# Patient Record
Sex: Male | Born: 1945 | Race: White | Hispanic: No | Marital: Married | Smoking: Never smoker
Health system: Southern US, Community
[De-identification: ages and names within clinical notes are randomized; demographics above are authoritative.]

## PROBLEM LIST (undated history)

## (undated) DIAGNOSIS — R0683 Snoring: Secondary | ICD-10-CM

## (undated) DIAGNOSIS — I499 Cardiac arrhythmia, unspecified: Secondary | ICD-10-CM

## (undated) DIAGNOSIS — I491 Atrial premature depolarization: Secondary | ICD-10-CM

## (undated) DIAGNOSIS — T4145XA Adverse effect of unspecified anesthetic, initial encounter: Secondary | ICD-10-CM

## (undated) DIAGNOSIS — I471 Supraventricular tachycardia: Secondary | ICD-10-CM

## (undated) DIAGNOSIS — I480 Paroxysmal atrial fibrillation: Secondary | ICD-10-CM

## (undated) DIAGNOSIS — I4719 Other supraventricular tachycardia: Secondary | ICD-10-CM

## (undated) DIAGNOSIS — T8859XA Other complications of anesthesia, initial encounter: Secondary | ICD-10-CM

## (undated) DIAGNOSIS — E7800 Pure hypercholesterolemia, unspecified: Secondary | ICD-10-CM

## (undated) DIAGNOSIS — M1990 Unspecified osteoarthritis, unspecified site: Secondary | ICD-10-CM

## (undated) DIAGNOSIS — J45909 Unspecified asthma, uncomplicated: Secondary | ICD-10-CM

## (undated) DIAGNOSIS — I209 Angina pectoris, unspecified: Secondary | ICD-10-CM

## (undated) DIAGNOSIS — R51 Headache: Secondary | ICD-10-CM

## (undated) DIAGNOSIS — R519 Headache, unspecified: Secondary | ICD-10-CM

## (undated) DIAGNOSIS — G971 Other reaction to spinal and lumbar puncture: Secondary | ICD-10-CM

## (undated) HISTORY — DX: Supraventricular tachycardia: I47.1

## (undated) HISTORY — DX: Paroxysmal atrial fibrillation: I48.0

## (undated) HISTORY — PX: TONSILLECTOMY: SUR1361

## (undated) HISTORY — PX: EYE SURGERY: SHX253

## (undated) HISTORY — DX: Other supraventricular tachycardia: I47.19

## (undated) HISTORY — DX: Pure hypercholesterolemia, unspecified: E78.00

## (undated) HISTORY — PX: OTHER SURGICAL HISTORY: SHX169

## (undated) HISTORY — DX: Snoring: R06.83

## (undated) HISTORY — DX: Atrial premature depolarization: I49.1

## (undated) HISTORY — PX: JOINT REPLACEMENT: SHX530

---

## 2008-08-16 ENCOUNTER — Encounter: Admission: RE | Admit: 2008-08-16 | Discharge: 2008-09-08 | Payer: Self-pay | Admitting: Neurology

## 2010-08-02 ENCOUNTER — Encounter: Admission: RE | Admit: 2010-08-02 | Discharge: 2010-08-30 | Payer: Self-pay | Admitting: Internal Medicine

## 2011-01-24 ENCOUNTER — Inpatient Hospital Stay (HOSPITAL_COMMUNITY): Payer: Medicare Other

## 2011-01-24 ENCOUNTER — Inpatient Hospital Stay (HOSPITAL_COMMUNITY)
Admission: AD | Admit: 2011-01-24 | Discharge: 2011-01-25 | DRG: 310 | Disposition: A | Discharge status: Home / Self Care | Payer: Medicare Other | Source: Ambulatory Visit | Attending: Internal Medicine | Admitting: Internal Medicine

## 2011-01-24 DIAGNOSIS — I4891 Unspecified atrial fibrillation: Principal | ICD-10-CM | POA: Diagnosis present

## 2011-01-24 DIAGNOSIS — J45909 Unspecified asthma, uncomplicated: Secondary | ICD-10-CM | POA: Diagnosis present

## 2011-01-24 LAB — COMPREHENSIVE METABOLIC PANEL
ALT: 19 U/L (ref 0–53)
AST: 15 U/L (ref 0–37)
Albumin: 4.2 g/dL (ref 3.5–5.2)
Alkaline Phosphatase: 52 U/L (ref 39–117)
BUN: 17 mg/dL (ref 6–23)
CO2: 23 mEq/L (ref 19–32)
Calcium: 9 mg/dL (ref 8.4–10.5)
Chloride: 104 mEq/L (ref 96–112)
Creatinine, Ser: 1.01 mg/dL (ref 0.4–1.5)
GFR calc Af Amer: >60 mL/min (ref >60)
GFR calc non Af Amer: >60 mL/min (ref >60)
Glucose, Bld: 89 mg/dL (ref 70–99)
Potassium: 3.9 mEq/L (ref 3.5–5.1)
Sodium: 140 mEq/L (ref 135–145)
Total Bilirubin: 0.6 mg/dL (ref 0.3–1.2)
Total Protein: 7 g/dL (ref 6.0–8.3)

## 2011-01-24 LAB — CBC
HCT: 46.2 % (ref 39.0–52.0)
Hemoglobin: 16.5 g/dL (ref 13.0–17.0)
MCH: 32.4 pg (ref 26.0–34.0)
MCHC: 35.7 g/dL (ref 30.0–36.0)
MCV: 90.6 fL (ref 78.0–100.0)
Platelets: 250 10*3/uL (ref 150–400)
RBC: 5.1 MIL/uL (ref 4.22–5.81)
RDW: 12.6 % (ref 11.5–15.5)
WBC: 9.5 10*3/uL (ref 4.0–10.5)

## 2011-01-24 LAB — CARDIAC PANEL(CRET KIN+CKTOT+MB+TROPI)
CK, MB: 2.5 ng/mL (ref 0.3–4.0)
Relative Index: INVALID (ref 0.0–2.5)
Total CK: 62 U/L (ref 7–232)
Troponin I: 0.01 ng/mL (ref 0.00–0.06)

## 2011-01-24 LAB — PROTIME-INR
INR: 0.97 (ref 0.00–1.49)
Prothrombin Time: 13.1 seconds (ref 11.6–15.2)

## 2011-01-24 LAB — TSH: TSH: 1.184 u[IU]/mL (ref 0.350–4.500)

## 2011-01-24 LAB — LIPID PANEL
Cholesterol: 232 mg/dL — ABNORMAL HIGH (ref 0–200)
HDL: 58 mg/dL (ref >39)
LDL Cholesterol: 140 mg/dL — ABNORMAL HIGH (ref 0–99)
Total CHOL/HDL Ratio: 4 RATIO
Triglycerides: 172 mg/dL — ABNORMAL HIGH (ref <150)
VLDL: 34 mg/dL (ref 0–40)

## 2011-01-24 LAB — APTT: aPTT: 30 seconds (ref 24–37)

## 2011-01-25 DIAGNOSIS — I517 Cardiomegaly: Secondary | ICD-10-CM

## 2011-01-25 LAB — CARDIAC PANEL(CRET KIN+CKTOT+MB+TROPI)
CK, MB: 1.8 ng/mL (ref 0.3–4.0)
CK, MB: 1.8 ng/mL (ref 0.3–4.0)
Relative Index: INVALID (ref 0.0–2.5)
Relative Index: INVALID (ref 0.0–2.5)
Total CK: 54 U/L (ref 7–232)
Total CK: 47 U/L (ref 7–232)
Troponin I: <0.01 ng/mL (ref 0.00–0.06)
Troponin I: 0.01 ng/mL (ref 0.00–0.06)

## 2011-01-25 LAB — CBC
HCT: 41.9 % (ref 39.0–52.0)
Hemoglobin: 14.5 g/dL (ref 13.0–17.0)
MCH: 31.7 pg (ref 26.0–34.0)
MCHC: 34.6 g/dL (ref 30.0–36.0)
MCV: 91.5 fL (ref 78.0–100.0)
Platelets: 217 10*3/uL (ref 150–400)
RBC: 4.58 MIL/uL (ref 4.22–5.81)
RDW: 12.9 % (ref 11.5–15.5)
WBC: 6.9 10*3/uL (ref 4.0–10.5)

## 2011-01-25 LAB — D-DIMER, QUANTITATIVE (NOT AT ARMC): D-Dimer, Quant: <0.22 ug/mL-FEU (ref 0.00–0.48)

## 2011-01-25 LAB — HEPARIN LEVEL (UNFRACTIONATED): Heparin Unfractionated: 0.13 IU/mL — ABNORMAL LOW (ref 0.30–0.70)

## 2011-01-31 NOTE — H&P (Addendum)
NAMENAT, LOWENTHAL NO.:  [id number]  MEDICAL RECORD NO.:  [id number]           PATIENT TYPE:  I  LOCATION:  2041                         FACILITY:  [facility]  PHYSICIAN:  Deirdre Peer. Janziel Hockett, M.D. DATE OF BIRTH:  1946/03/25  DATE OF ADMISSION:  01/24/2011 DATE OF DISCHARGE:                             HISTORY & PHYSICAL   CHIEF COMPLAINT:  Fatigue.  HISTORY OF PRESENT ILLNESS:  Pleasant 65 year old male presents to the office with complaint of irregular heartbeat and weakness.  The patient was in his usual state of health until walking to the gym the day he felt a little fatigued, no energy and had a vague discomfort in his left arm.  When he got to the gym, he did not feel like he had much energy; therefore, he tried to lift less weight that he typically does; however, because of feeling fatigue and occasional palpitations, he left the gym and went home.  When he went home, his wife felt his pulse, checked his blood pressure and felt that his heart rate was irregular.  Because of that, the patient presented to the office for evaluation.  In the office, he was evaluated and was found to be in atrial fibrillation.  He still feels a little fatigue and no energy; however, no shortness of breath.  He has had no periods of chest pain.  He has never had atrial fibrillation before.  There is no excessive alcohol.  No drugs.  No excessive caffeine.  The patient has had stress test in the past year, which was negative for any ischemia.  There is no history of congestive heart failure, hypertension, diabetes and he is less than 32 years of age given that with CHADS score of 0.  Because of his AFib and being symptomatic, the patient will be admitted for further evaluation and treatment.  PAST MEDICAL HISTORY:  Significant for: 1. Asthma. 2. 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  ALLERGIES:  No known drug allergies.  MEDICATIONS:  P.r.n. diclofenac, Flonase  p.r.n.  SURGICAL HISTORY:  Significant for eye surgery, status post trauma and repair of perianal fistula.  FAMILY HISTORY:  Father deceased, he had heart disease and MI.  He was a smoker.  His mother deceased.  She also had heart disease.  One sister deceased from pancreatic cancer.  SOCIAL HISTORY:  Negative for tobacco.  Social alcohol.  No drugs.  REVIEW OF SYSTEMS:  As stated in HPI.  PHYSICAL EXAMINATION:  GENERAL:  The patient is alert and oriented x3, weight 167 pounds, temperature 98.6, BP 112/64, pulse by EKG 143. HEENT:  Unremarkable. CHEST:  Clear without rales, rhonchi or rub. CARDIOVASCULAR:  Irregularly irregular rhythm. ABDOMEN:  Soft, nontender. EXTREMITIES:  No clubbing, cyanosis or edema.  DATA:  EKG; atrial fibrillation with a rate of 143.  ASSESSMENT:  New-onset atrial fibrillation, multiple rapid ventricular response in a patient who is marginally symptomatic.  The patient will be admitted to the hospital, labs will be obtain including TSH, electrolytes and serial cardiac enzymes.  The patient will be started on IV heparin as well as IV diltiazem.  We will obtain a 2-D echo while he is hospitalized.  The patient may have a cardiac evaluation by cardiologist to help determine if Coumadin versus other agents such as aspirin or Permax are indicated.  We will make further recommendations after review of the above studies.     Deirdre Peer. Dhruti Ghuman, M.D.     RDP/MEDQ  D:  01/24/2011  T:  01/25/2011  Job:  [id number]  Electronically Signed by Windy Fast Juliene Kirsh M.D. on 01/29/2011 05:22:53 PM Electronically Signed by Windy Fast Jareli Highland M.D. on 01/29/2011 05:22:53 PM Electronically Signed by Windy Fast Calem Cocozza M.D. on 01/29/2011 07:36:58 PM

## 2011-02-26 NOTE — Discharge Summary (Signed)
  NAMEROLLYN, SCIALDONE NO.:  [id number]  MEDICAL RECORD NO.:  [id number]           PATIENT TYPE:  I  LOCATION:  2041                         FACILITY:  [facility]  PHYSICIAN:  Theressa Millard, M.D.    DATE OF BIRTH:  1946-07-24  DATE OF ADMISSION:  01/24/2011 DATE OF DISCHARGE:  01/25/2011                              DISCHARGE SUMMARY   ADMITTING DIAGNOSIS:  Atrial fibrillation.  DISCHARGE DIAGNOSES: 1. Transient atrial fibrillation, resolved. 2. Asthma.  The patient is a 65 year old white male who presented with atrial fibrillation with rapid ventricular rate to the office.  By the time he got to the hospital, his atrial fibrillation had resolved.  Thyroid testing was negative.  Echocardiogram was unremarkable.  Metoprolol was added.  He was discharged in improved condition.  DISCHARGE MEDICATIONS: 1. Diclofenac 75 mg 1 b.i.d. p.r.n. 2. Flonase 2 puffs daily. 3. Metoprolol tartrate 25 mg twice daily. FOLLOWUP:  We will call to check on him in followup but he will keep his August 2012 appointment unless he has further problems.  ACTIVITY:  As tolerated.  DIET:  No added salt.     Theressa Millard, M.D.     JO/MEDQ  D:  02/08/2011  T:  02/09/2011  Job:  [id number]  Electronically Signed by Theressa Millard M.D. on 02/26/2011 08:09:16 AM

## 2011-08-19 ENCOUNTER — Inpatient Hospital Stay (HOSPITAL_COMMUNITY)
Admission: AD | Admit: 2011-08-19 | Discharge: 2011-08-21 | DRG: 310 | Disposition: A | Discharge status: Home / Self Care | Payer: Medicare Other | Source: Ambulatory Visit | Attending: Internal Medicine | Admitting: Internal Medicine

## 2011-08-19 ENCOUNTER — Inpatient Hospital Stay (HOSPITAL_COMMUNITY): Payer: Medicare Other

## 2011-08-19 DIAGNOSIS — J45909 Unspecified asthma, uncomplicated: Secondary | ICD-10-CM | POA: Diagnosis present

## 2011-08-19 DIAGNOSIS — Z79899 Other long term (current) drug therapy: Secondary | ICD-10-CM

## 2011-08-19 DIAGNOSIS — I4891 Unspecified atrial fibrillation: Principal | ICD-10-CM | POA: Diagnosis present

## 2011-08-19 DIAGNOSIS — G4733 Obstructive sleep apnea (adult) (pediatric): Secondary | ICD-10-CM | POA: Diagnosis present

## 2011-08-19 LAB — PROTIME-INR
INR: 1.01 (ref 0.00–1.49)
Prothrombin Time: 13.5 seconds (ref 11.6–15.2)

## 2011-08-19 LAB — COMPREHENSIVE METABOLIC PANEL
ALT: 15 U/L (ref 0–53)
AST: 16 U/L (ref 0–37)
Albumin: 4.2 g/dL (ref 3.5–5.2)
Alkaline Phosphatase: 48 U/L (ref 39–117)
BUN: 18 mg/dL (ref 6–23)
CO2: 24 mEq/L (ref 19–32)
Calcium: 9 mg/dL (ref 8.4–10.5)
Chloride: 107 mEq/L (ref 96–112)
Creatinine, Ser: 0.92 mg/dL (ref 0.50–1.35)
GFR calc Af Amer: >60 mL/min (ref >60)
GFR calc non Af Amer: >60 mL/min (ref >60)
Glucose, Bld: 95 mg/dL (ref 70–99)
Potassium: 3.9 mEq/L (ref 3.5–5.1)
Sodium: 141 mEq/L (ref 135–145)
Total Bilirubin: 0.6 mg/dL (ref 0.3–1.2)
Total Protein: 6.9 g/dL (ref 6.0–8.3)

## 2011-08-19 LAB — CBC
HCT: 44.3 % (ref 39.0–52.0)
Hemoglobin: 15.9 g/dL (ref 13.0–17.0)
MCH: 32.5 pg (ref 26.0–34.0)
MCHC: 35.9 g/dL (ref 30.0–36.0)
MCV: 90.6 fL (ref 78.0–100.0)
Platelets: 201 10*3/uL (ref 150–400)
RBC: 4.89 MIL/uL (ref 4.22–5.81)
RDW: 12.6 % (ref 11.5–15.5)
WBC: 9.3 10*3/uL (ref 4.0–10.5)

## 2011-08-19 LAB — CARDIAC PANEL(CRET KIN+CKTOT+MB+TROPI)
CK, MB: 5.1 ng/mL — ABNORMAL HIGH (ref 0.3–4.0)
Relative Index: INVALID (ref 0.0–2.5)
Total CK: 98 U/L (ref 7–232)
Troponin I: <0.3 ng/mL (ref <0.30)

## 2011-08-19 LAB — TSH: TSH: 1.2 u[IU]/mL (ref 0.350–4.500)

## 2011-08-19 LAB — APTT: aPTT: 30 seconds (ref 24–37)

## 2011-08-20 LAB — CBC
HCT: 43 % (ref 39.0–52.0)
Hemoglobin: 15.2 g/dL (ref 13.0–17.0)
MCH: 32.3 pg (ref 26.0–34.0)
MCHC: 35.3 g/dL (ref 30.0–36.0)
MCV: 91.3 fL (ref 78.0–100.0)
Platelets: 201 10*3/uL (ref 150–400)
RBC: 4.71 MIL/uL (ref 4.22–5.81)
RDW: 12.8 % (ref 11.5–15.5)
WBC: 7.3 10*3/uL (ref 4.0–10.5)

## 2011-08-20 LAB — CARDIAC PANEL(CRET KIN+CKTOT+MB+TROPI)
CK, MB: 4.1 ng/mL — ABNORMAL HIGH (ref 0.3–4.0)
Relative Index: INVALID (ref 0.0–2.5)
Total CK: 77 U/L (ref 7–232)
Troponin I: <0.3 ng/mL (ref <0.30)

## 2011-08-24 NOTE — Consult Note (Signed)
David Wright, NIDAY NO.:  [id number]  MEDICAL RECORD NO.:  [id number]  LOCATION:  2023                         FACILITY:  [facility]  PHYSICIAN:  Armanda Magic, M.D.     DATE OF BIRTH:  11-Nov-1946  DATE OF CONSULTATION:  08/20/2011 DATE OF DISCHARGE:                                CONSULTATION   REFERRING PHYSICIAN:  Theressa Millard, MD  CHIEF COMPLAINT:  Palpitations and atrial fibrillation.  HISTORY OF PRESENT ILLNESS:  This is a 65 year old male with a history of atrial fibrillation episode x1 back in April 2012 and at that time was admitted briefly to the hospital, but converted back to sinus rhythm before he then got to a floor bed.  He was on metoprolol at home and over the weekend on Saturday started having palpitations and noticing his heart beating irregular and fast.  He said it would go anywhere from 80-140 beats per minute.  He denied any chest pain, dizziness, shortness of breath, or syncope, but did notice some mild dyspnea on exertion going up the stairs.  He presented to the office yesterday and was found to be in atrial fibrillation with RVR with a heart rate 148 beats per minute.  He was started on IV Cardizem drip and started on subcu Lovenox per pharmacy for AFib.  Of note, his workup in March was normal with a 2- D echocardiogram that was normal and he had a nuclear stress test in 2011 that was normal as well.  His TSH has been normal.  PAST MEDICAL HISTORY: 1. Asthma. 2. Atrial fibrillation. 3. Mild arthritis.  MEDICATIONS ON ADMISSION: 1. Flonase nasal spray once a day. 2. Metoprolol 25 mg b.i.d. 3. Advair 100/50 mcg 1 inhalation b.i.d.  ALLERGIES:  None.  SURGICAL HISTORY:  Eye surgery for trauma.  FAMILY HISTORY:  Significant for heart disease in both parents and pancreatic CA.  SOCIAL HISTORY:  He denies any tobacco use.  He occasionally drinks alcohol.  He denies any caffeine.  He is an obstruction Programmer, multimedia. He is  married.  REVIEW OF SYSTEMS:  Otherwise as stated in HPI is negative.  PHYSICAL EXAMINATION:  VITAL SIGNS:  Blood pressure is 116/71, heart rate 94, he is afebrile, respirations 18, and O2 saturation 94% on room air. GENERAL:  This is a well-developed, well-nourished white male in no acute distress. HEENT:  Benign. NECK:  Supple without lymphadenopathy.  Carotid upstrokes are +2 bilaterally.  No bruits. LUNGS:  Clear to auscultation throughout. HEART:  Irregularly irregular.  No murmurs, rubs, or gallops.  Normal S1 and S2. ABDOMEN:  Soft, nontender, and nondistended.  Normoactive bowel sounds. No hepatosplenomegaly. EXTREMITIES:  No cyanosis, erythema, or edema.  LABORATORY DATA:  His white cell count is 9.3, hemoglobin 15.9, hematocrit 44.3, and platelet count 201.  INR is 1.01.  Cardiac enzymes: CPK 98 and 77 and MB 5.1 and 4.1, troponin less than 0.3 x2.  Sodium 141, potassium 3.9, chloride 107, bicarb 24, glucose 95, BUN 18, and creatinine 0.92.  LFTs are normal.  TSH is 1.2.  Chest x-ray showed no active disease with borderline cardiomegaly without edema.  EKG on admission showed atrial fibrillation with  rapid ventricular response at 35 beats per minute with nonspecific ST abnormality.  ASSESSMENT: 1. Recurrent atrial fibrillation with rapid ventricular response,     questionably secondary obstructive sleep apnea. 2. Snoring and sleep apnea consistent with obstructive sleep apnea. 3. Asthma.  PLAN:  Start Xarelto and stop Lovenox.  We will change IV Cardizem to Cardizem CD 180 mg daily.  Continue metoprolol.  If heart rate is well controlled with ambulation, okay to discharge tomorrow.  We would recommend outpatient sleep study asap.  If he does have sleep apnea, we would prefer for him to be on CPAP prior to cardioversion.  I will see him back in the office in 2 weeks.  If he is still in atrial fibrillation, we will have an outpatient cardioversion after 4 weeks  of Xarelto.  He will need to be on anticoagulation for 4 additional weeks after cardioversion and then okay to transition to aspirin since his CHADS score is 0.     Armanda Magic, M.D.     TT/MEDQ  D:  08/20/2011  T:  08/20/2011  Job:  [id number]  Electronically Signed by Armanda Magic M.D. on 08/24/2011 04:51:40 PM

## 2011-08-26 NOTE — H&P (Signed)
NAMETATEN, MERROW NO.:  [id number]  MEDICAL RECORD NO.:  [id number]  LOCATION:  2023                         FACILITY:  [facility]  PHYSICIAN:  Georgann Housekeeper, MD      DATE OF BIRTH:  [birth year]  DATE OF ADMISSION:  08/19/2011 DATE OF DISCHARGE:                             HISTORY & PHYSICAL   PRIMARY CARE PHYSICIAN:  Theressa Millard, MD  CHIEF COMPLAINT:  Palpitation and history of atrial fibrillation.  HISTORY OF PRESENT ILLNESS:  A 65 year old male with history of atrial fibrillation episode back in April of this year, was admitted briefly to the hospital, we converted back to normal sinus rhythm.  He was on metoprolol at home over the weekend on Saturday, started having palpitation and heart beating fast and irregular and according to him, the heart rate would go anyway from 80-140 and he had no chest pain, no dizziness, no syncope, no shortness of breath, came into the office with those complaints.  The patient was found to be in atrial fibrillation with rapid ventricular response in the office with a heart rate of 148. He denied any chest pain or shortness of breath.  His blood pressure was systolic 120.  He had not taken any new medications.  His workup in the past including 2-D echocardiogram in March was normal.  He had negative cardiac stress test by cardiology in 2011.  Thyroid test was normal.  PAST MEDICAL HISTORY:  Includes asthma, atrial fibrillation and mentioned above, and some mild arthritis.  MEDICATIONS:  On admission, 1. Flonase nasal spray once a day. 2. Metoprolol 25 mg b.i.d. 3. Advair 100/50 one inhalation b.i.d.  ALLERGIES:  None.  SURGICAL HISTORY:  Eye surgery for trauma.  FAMILY HISTORY:  Positive for heart disease in both parents and pancreatic cancer in the system  SOCIAL HISTORY:  No tobacco.  Alcohol social.  No caffeine, occasional. He is a Psychologist, sport and exercise.  Married.  PHYSICAL EXAMINATION:  GENERAL:  He  is pleasant gentleman in the office, alert and oriented, in no acute distress. VITAL SIGNS:  Blood pressure 120/70, heart rate between 120-140, irregular, temperature 97.2, weight of 164 pounds, height of 68 inches. NECK:  Supple.  No thyromegaly. LUNGS: Clear. CARDIAC:  Irregularly irregular rhythm, tachycardic without any murmurs. EXTREMITIES:  No edema.  Good pulses. ABDOMEN:  Soft without any tenderness.  LABORATORY DATA:  EKG obtained in the office showed atrial fibrillation with rapid ventricular response of heart rate of 148.  ASSESSMENT:  A 65 year old gentleman with atrial fibrillation, rapid ventricular response. 1. Cardiac, atrial fibrillation.  I talked with cardiology briefly.     Because of his atrial fibrillation and rapid ventricular response,     was sent him to the hospital admission for rate control.  Start him     on diltiazem drip, Lovenox per pharmacy full dose.  He may need     long-term anticoagulation.  He recently had an echo in March.  We     will hold off on that.  Cardiac markers, blood work, EKG, and chest     x-ray to be done in the hospital.  If needed, we will call the  cardiology, telemetry monitoring for the heart rate and blood     pressure. 2. History of asthma.  Continue on his Advair and Flonase as far as     follow up in the morning.  Discussed plan with the patient and his wife.  The patient was sent to the emergency room and orders written.     Georgann Housekeeper, MD     KH/MEDQ  D:  08/19/2011  T:  08/19/2011  Job:  [id number]  Electronically Signed by Georgann Housekeeper MD on 08/26/2011 09:58:12 PM

## 2011-08-29 NOTE — Discharge Summary (Signed)
  NAMEDECLIN, RAJAN NO.:  [id number]  MEDICAL RECORD NO.:  [id number]  LOCATION:  2023                         FACILITY:  [facility]  PHYSICIAN:  Theressa Millard, M.D.    DATE OF BIRTH:  1946-08-29  DATE OF ADMISSION:  08/19/2011 DATE OF DISCHARGE:  08/21/2011                              DISCHARGE SUMMARY   ADMITTING DIAGNOSIS:  Atrial fibrillation with rapid ventricular rate.  DISCHARGE DIAGNOSES: 1. Rapid atrial fibrillation, paroxysmal, with rapid ventricular rate. 2. Rule out obstructive sleep apnea with loud smearing and history of     witnessed apnea. 3. Asthma.  The patient is a 65 year old white male, had an episode of atrial fibrillation in March of this year.  At that time, his echocardiogram showed grade 1 diastolic dysfunction along with left atrial enlargement. At that time, no etiology of the atrial fibrillation was found.  With this recurring episode, I asked the patient specific questions about sleep apnea and he stated that he has been told that he snores loudly in that he has had witnessed apnea, but he has not had any excessive daytime sleepiness, so he did not think that it was very important.  Of note is the fact that his wife has obstructive sleep apnea, is currently under treatment with CPAP.  Initially, his rate was quite rapid in the 120s to 130s.  He was given a Cardizem drip that seemed to slow slightly.  He was seen in consultation by Dr. Mayford Knife.  She recommended switching him to oral diltiazem along with resuming the oral metoprolol that he was on prior to admission. This was done and resulted in resolution in his atrial fibrillation.  At this point, the patient will be discharged on beta blocker, calcium channel blocker, and aspirin.  Anticoagulation will not be done since the patient got out of atrial fibrillation and less than 72 hours.  Regarding etiology, we will arrange a sleep study to be done in the  near future.  DISCHARGE MEDICATIONS: 1. Diltiazem 180 CD once daily. 2. Advair 100/50 one puff b.i.d. 3. Diclofenac 75 mg b.i.d. p.r.n. 4. Flonase one spray in each nostril daily. 5. Metoprolol tartrate 25 mg twice daily.  FOLLOWUP:  We will arrange a sleep study for the patient and then will follow up the patient after that.  DIET:  No added salt.  ACTIVITY:  As tolerated.     Theressa Millard, M.D.     JO/MEDQ  D:  08/21/2011  T:  08/21/2011  Job:  [id number]  Electronically Signed by Theressa Millard M.D. on 08/29/2011 01:40:37 PM

## 2012-01-06 DIAGNOSIS — J45909 Unspecified asthma, uncomplicated: Secondary | ICD-10-CM | POA: Diagnosis not present

## 2012-01-06 DIAGNOSIS — J301 Allergic rhinitis due to pollen: Secondary | ICD-10-CM | POA: Diagnosis not present

## 2012-01-06 DIAGNOSIS — J3089 Other allergic rhinitis: Secondary | ICD-10-CM | POA: Diagnosis not present

## 2012-01-13 DIAGNOSIS — R002 Palpitations: Secondary | ICD-10-CM | POA: Diagnosis not present

## 2012-01-13 DIAGNOSIS — I4891 Unspecified atrial fibrillation: Secondary | ICD-10-CM | POA: Diagnosis not present

## 2012-01-15 DIAGNOSIS — R079 Chest pain, unspecified: Secondary | ICD-10-CM | POA: Diagnosis not present

## 2012-01-16 DIAGNOSIS — I4891 Unspecified atrial fibrillation: Secondary | ICD-10-CM | POA: Diagnosis not present

## 2012-01-17 DIAGNOSIS — I4891 Unspecified atrial fibrillation: Secondary | ICD-10-CM | POA: Diagnosis not present

## 2012-01-20 DIAGNOSIS — R079 Chest pain, unspecified: Secondary | ICD-10-CM | POA: Diagnosis not present

## 2012-01-20 DIAGNOSIS — I498 Other specified cardiac arrhythmias: Secondary | ICD-10-CM | POA: Diagnosis not present

## 2012-01-20 DIAGNOSIS — I4891 Unspecified atrial fibrillation: Secondary | ICD-10-CM | POA: Diagnosis not present

## 2012-01-20 DIAGNOSIS — I472 Ventricular tachycardia: Secondary | ICD-10-CM | POA: Diagnosis not present

## 2012-01-23 DIAGNOSIS — H2510 Age-related nuclear cataract, unspecified eye: Secondary | ICD-10-CM | POA: Diagnosis not present

## 2012-01-23 DIAGNOSIS — H35039 Hypertensive retinopathy, unspecified eye: Secondary | ICD-10-CM | POA: Diagnosis not present

## 2012-01-23 DIAGNOSIS — H43399 Other vitreous opacities, unspecified eye: Secondary | ICD-10-CM | POA: Diagnosis not present

## 2012-01-24 DIAGNOSIS — I472 Ventricular tachycardia: Secondary | ICD-10-CM | POA: Diagnosis not present

## 2012-01-24 DIAGNOSIS — I4891 Unspecified atrial fibrillation: Secondary | ICD-10-CM | POA: Diagnosis not present

## 2012-01-24 DIAGNOSIS — I498 Other specified cardiac arrhythmias: Secondary | ICD-10-CM | POA: Diagnosis not present

## 2012-01-24 DIAGNOSIS — R079 Chest pain, unspecified: Secondary | ICD-10-CM | POA: Diagnosis not present

## 2012-02-20 DIAGNOSIS — I4891 Unspecified atrial fibrillation: Secondary | ICD-10-CM | POA: Diagnosis not present

## 2012-02-20 DIAGNOSIS — I491 Atrial premature depolarization: Secondary | ICD-10-CM | POA: Diagnosis not present

## 2012-05-13 DIAGNOSIS — M25559 Pain in unspecified hip: Secondary | ICD-10-CM | POA: Diagnosis not present

## 2012-05-25 DIAGNOSIS — I4891 Unspecified atrial fibrillation: Secondary | ICD-10-CM | POA: Diagnosis not present

## 2012-05-25 DIAGNOSIS — I959 Hypotension, unspecified: Secondary | ICD-10-CM | POA: Diagnosis not present

## 2012-05-25 DIAGNOSIS — R002 Palpitations: Secondary | ICD-10-CM | POA: Diagnosis not present

## 2012-06-08 DIAGNOSIS — R002 Palpitations: Secondary | ICD-10-CM | POA: Diagnosis not present

## 2012-06-08 DIAGNOSIS — I959 Hypotension, unspecified: Secondary | ICD-10-CM | POA: Diagnosis not present

## 2012-06-08 DIAGNOSIS — I4891 Unspecified atrial fibrillation: Secondary | ICD-10-CM | POA: Diagnosis not present

## 2012-06-10 DIAGNOSIS — M25559 Pain in unspecified hip: Secondary | ICD-10-CM | POA: Diagnosis not present

## 2012-07-06 DIAGNOSIS — M25559 Pain in unspecified hip: Secondary | ICD-10-CM | POA: Diagnosis not present

## 2012-07-17 DIAGNOSIS — M545 Low back pain: Secondary | ICD-10-CM | POA: Diagnosis not present

## 2012-07-17 DIAGNOSIS — M25559 Pain in unspecified hip: Secondary | ICD-10-CM | POA: Diagnosis not present

## 2012-07-30 DIAGNOSIS — M545 Low back pain: Secondary | ICD-10-CM | POA: Diagnosis not present

## 2012-08-03 DIAGNOSIS — M545 Low back pain: Secondary | ICD-10-CM | POA: Diagnosis not present

## 2012-08-03 DIAGNOSIS — M25559 Pain in unspecified hip: Secondary | ICD-10-CM | POA: Diagnosis not present

## 2012-08-06 DIAGNOSIS — M545 Low back pain: Secondary | ICD-10-CM | POA: Diagnosis not present

## 2012-08-06 DIAGNOSIS — M25559 Pain in unspecified hip: Secondary | ICD-10-CM | POA: Diagnosis not present

## 2012-08-10 DIAGNOSIS — M545 Low back pain: Secondary | ICD-10-CM | POA: Diagnosis not present

## 2012-08-14 DIAGNOSIS — M25559 Pain in unspecified hip: Secondary | ICD-10-CM | POA: Diagnosis not present

## 2012-08-14 DIAGNOSIS — M545 Low back pain: Secondary | ICD-10-CM | POA: Diagnosis not present

## 2012-08-17 DIAGNOSIS — M545 Low back pain: Secondary | ICD-10-CM | POA: Diagnosis not present

## 2012-08-20 DIAGNOSIS — M545 Low back pain: Secondary | ICD-10-CM | POA: Diagnosis not present

## 2012-08-24 DIAGNOSIS — M47817 Spondylosis without myelopathy or radiculopathy, lumbosacral region: Secondary | ICD-10-CM | POA: Diagnosis not present

## 2012-08-26 DIAGNOSIS — M25559 Pain in unspecified hip: Secondary | ICD-10-CM | POA: Diagnosis not present

## 2012-08-28 DIAGNOSIS — I4891 Unspecified atrial fibrillation: Secondary | ICD-10-CM | POA: Diagnosis not present

## 2012-09-25 DIAGNOSIS — J3089 Other allergic rhinitis: Secondary | ICD-10-CM | POA: Diagnosis not present

## 2012-09-25 DIAGNOSIS — J301 Allergic rhinitis due to pollen: Secondary | ICD-10-CM | POA: Diagnosis not present

## 2012-09-25 DIAGNOSIS — J45909 Unspecified asthma, uncomplicated: Secondary | ICD-10-CM | POA: Diagnosis not present

## 2012-10-09 ENCOUNTER — Other Ambulatory Visit: Payer: Self-pay | Admitting: Orthopaedic Surgery

## 2012-10-10 IMAGING — CR DG CHEST 2V
2 series · 2 of 2 positions shown · non-contrast
Comparison: 01/24/2011

CLINICAL DATA: Shortness of breath, weakness

CHEST - 2 VIEW

[w chest pa]
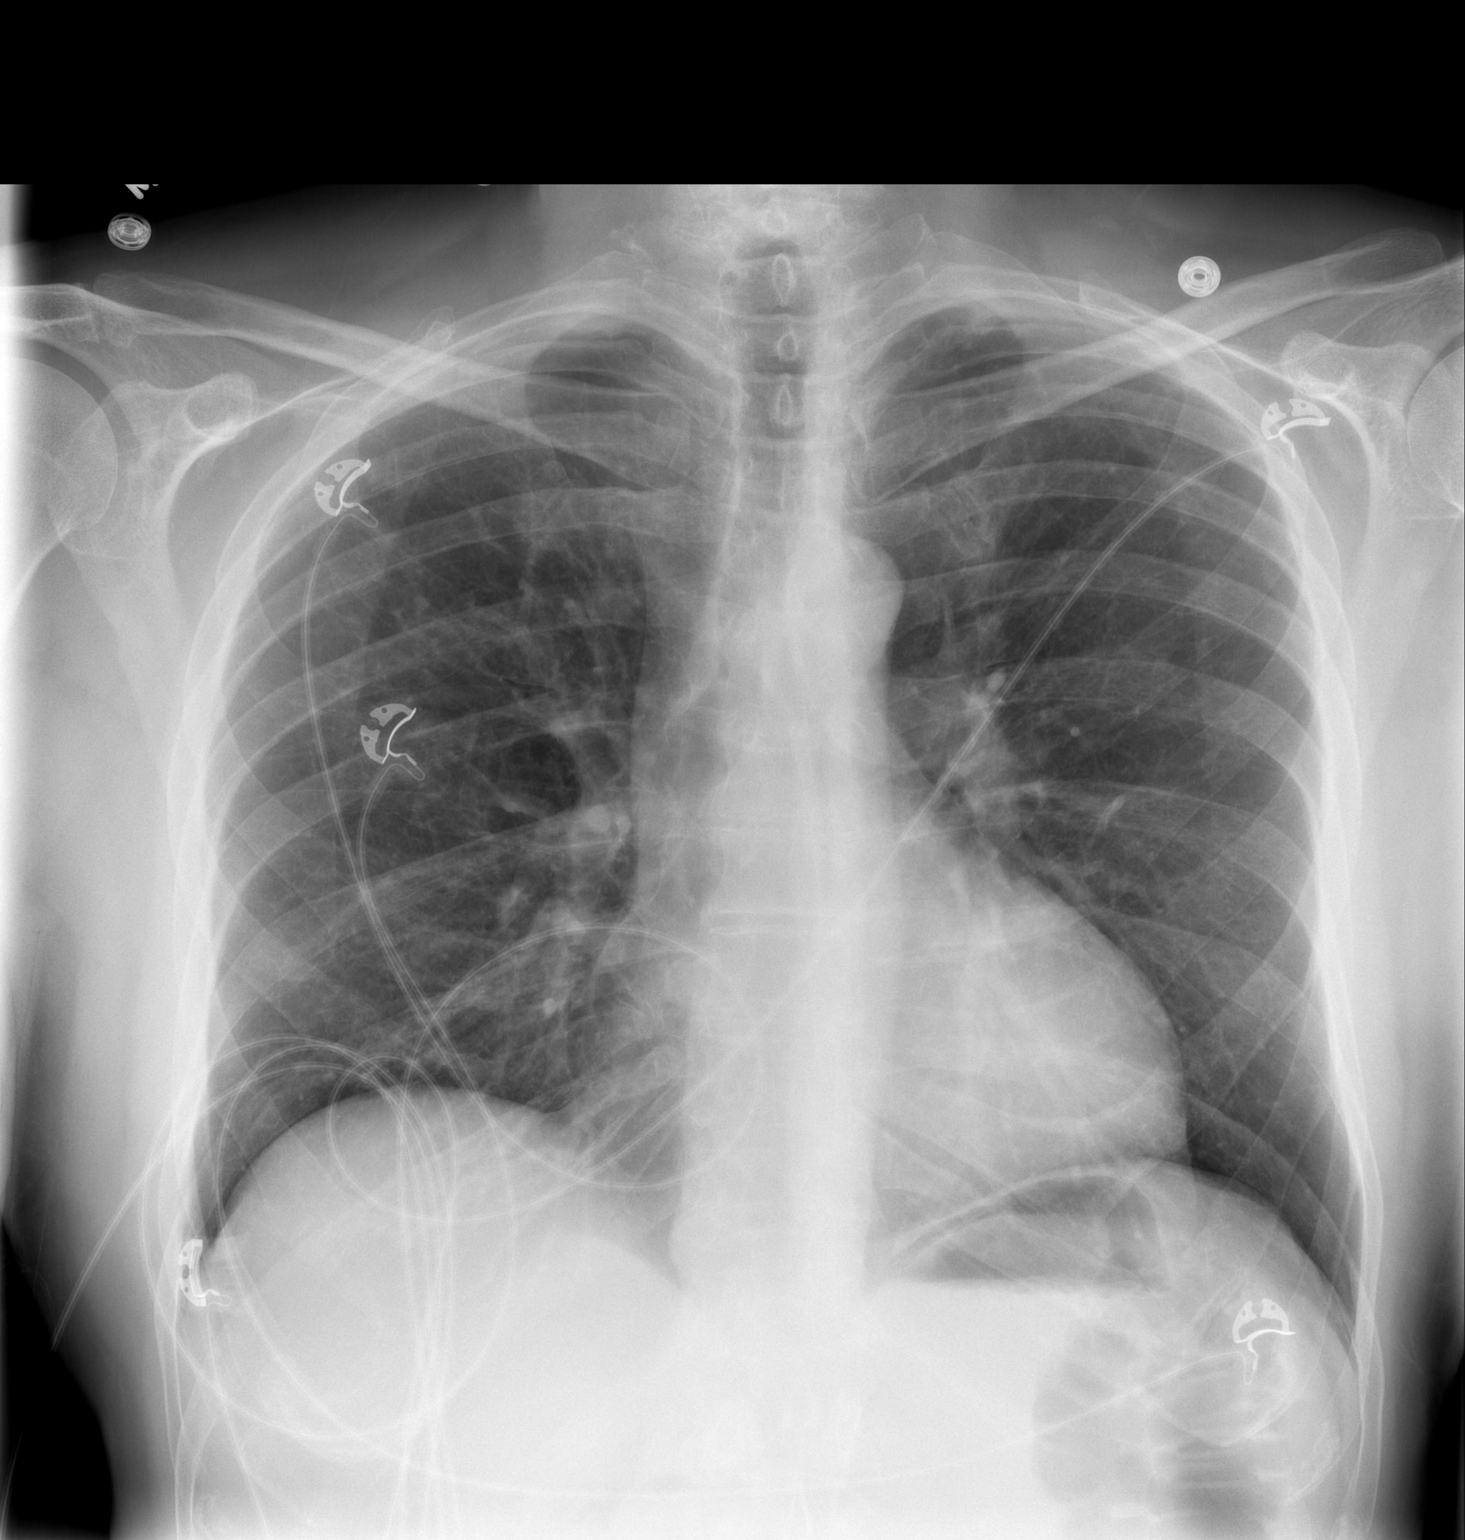

[w chest lat]
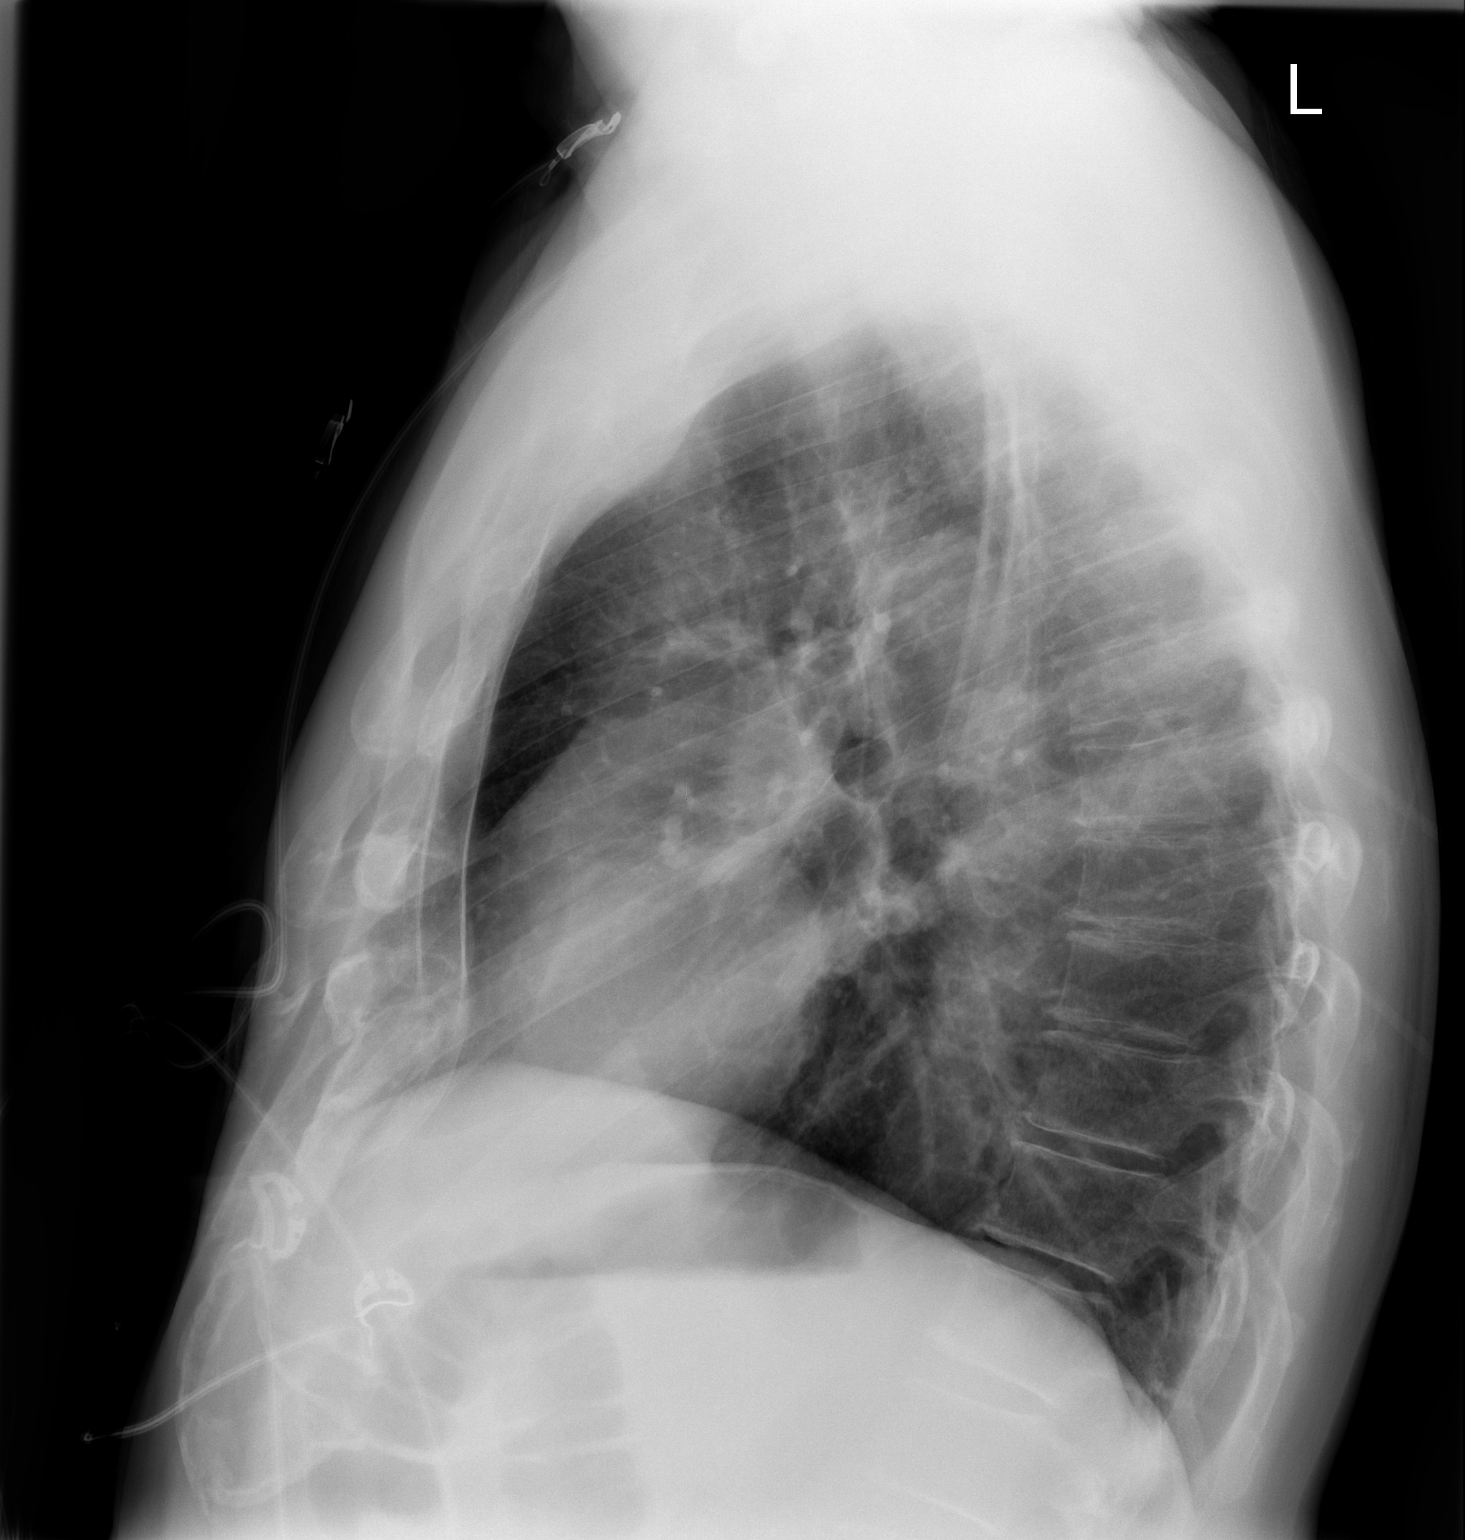

[2 of 2 positions shown; findings below may reference images not displayed]

FINDINGS: Cardiac leads overlie the chest.  Borderline cardiomegaly
again noted without edema.  The lung fields are clear.  No acute
osseous finding. No pleural effusion.
IMPRESSION: No acute cardiopulmonary process.  Borderline cardiomegaly without
edema.

## 2012-10-19 ENCOUNTER — Encounter (HOSPITAL_COMMUNITY): Payer: Self-pay | Admitting: Pharmacy Technician

## 2012-10-20 ENCOUNTER — Encounter (HOSPITAL_COMMUNITY)
Admission: RE | Admit: 2012-10-20 | Discharge: 2012-10-20 | Disposition: A | Discharge status: Home / Self Care | Payer: Medicare Other | Source: Ambulatory Visit | Attending: Orthopaedic Surgery | Admitting: Orthopaedic Surgery

## 2012-10-20 ENCOUNTER — Encounter (HOSPITAL_COMMUNITY): Payer: Self-pay

## 2012-10-20 ENCOUNTER — Ambulatory Visit (HOSPITAL_COMMUNITY)
Admission: RE | Admit: 2012-10-20 | Discharge: 2012-10-20 | Disposition: A | Discharge status: Home / Self Care | Payer: Medicare Other | Source: Ambulatory Visit | Attending: Orthopaedic Surgery | Admitting: Orthopaedic Surgery

## 2012-10-20 DIAGNOSIS — Z01818 Encounter for other preprocedural examination: Secondary | ICD-10-CM | POA: Insufficient documentation

## 2012-10-20 HISTORY — DX: Angina pectoris, unspecified: I20.9

## 2012-10-20 HISTORY — DX: Unspecified asthma, uncomplicated: J45.909

## 2012-10-20 HISTORY — DX: Cardiac arrhythmia, unspecified: I49.9

## 2012-10-20 HISTORY — DX: Unspecified osteoarthritis, unspecified site: M19.90

## 2012-10-20 LAB — BASIC METABOLIC PANEL
BUN: 19 mg/dL (ref 6–23)
Chloride: 102 mEq/L (ref 96–112)
GFR calc Af Amer: 87 mL/min — ABNORMAL LOW (ref >90)
Potassium: 4.1 mEq/L (ref 3.5–5.1)
Sodium: 139 mEq/L (ref 135–145)

## 2012-10-20 LAB — CBC WITH DIFFERENTIAL/PLATELET
HCT: 46.8 % (ref 39.0–52.0)
Hemoglobin: 16.4 g/dL (ref 13.0–17.0)
Lymphocytes Relative: 19 % (ref 12–46)
Lymphs Abs: 1.2 10*3/uL (ref 0.7–4.0)
Monocytes Absolute: 0.7 10*3/uL (ref 0.1–1.0)
Monocytes Relative: 10 % (ref 3–12)
Neutro Abs: 4.6 10*3/uL (ref 1.7–7.7)
RBC: 5.1 MIL/uL (ref 4.22–5.81)
WBC: 6.7 10*3/uL (ref 4.0–10.5)

## 2012-10-20 LAB — URINALYSIS, ROUTINE W REFLEX MICROSCOPIC
Ketones, ur: NEGATIVE mg/dL
Leukocytes, UA: NEGATIVE
Protein, ur: NEGATIVE mg/dL
Urobilinogen, UA: 0.2 mg/dL (ref 0.0–1.0)

## 2012-10-20 LAB — SURGICAL PCR SCREEN: Staphylococcus aureus: NEGATIVE

## 2012-10-20 LAB — TYPE AND SCREEN
ABO/RH(D): A POS
Antibody Screen: NEGATIVE

## 2012-10-20 LAB — PROTIME-INR: INR: 0.92 (ref 0.00–1.49)

## 2012-10-20 LAB — ABO/RH: ABO/RH(D): A POS

## 2012-10-20 NOTE — Progress Notes (Signed)
EKG,Stress,Echo OV requested from Dr. Mayford Knife [facility])

## 2012-10-20 NOTE — Pre-Procedure Instructions (Signed)
20 Lupe Bonner  10/20/2012   Your procedure is scheduled on:  10-27-2012  Report to [facility] at 8:50 AM. Take East elevators to 3rd floor  Call this number if you have problems the morning of surgery: [phone]   Remember:   Do not eat food or drink:After Midnight.    Take these medicines the morning of surgery with A SIP OF WATER: Flecainide(Tambocor),fluticasone(Flonase),metoprolol(Lopressor),nabumetone(Relafen)   Do not wear jewelry,  Do not wear lotions, powders, or perfumes.   Do not shave 48 hours prior to surgery. Men may shave face and neck.  Do not bring valuables to the hospital.  Contacts, dentures or bridgework may not be worn into surgery.  Leave suitcase in the car. After surgery it may be brought to your room.   For patients admitted to the hospital, checkout time is 11:00 AM the day of discharge.  Marland Kitchen    Special Instructions: Shower using CHG 2 nights before surgery and the night before surgery.  If you shower the day of surgery use CHG.  Use special wash - you have one bottle of CHG for all showers.  You should use approximately 1/3 of the bottle for each shower.    Please read over the following fact sheets that you were given: Pain Booklet, Coughing and Deep Breathing, Blood Transfusion Information, MRSA Information and Surgical Site Infection Prevention

## 2012-10-20 NOTE — Pre-Procedure Instructions (Deleted)
20 David Wright  10/20/2012   Your procedure is scheduled on:  10-27-2012  Report to [facility] at 8:50 AM   Take east elevators to the 3rd floor.  Call this number if you have problems the morning of surgery: [phone]   Remember:   Do not eat food or drink:After Midnight.     Take these medicines the morning of surgery with A SIP OF WATER:  Flecainide(Tambocor),fluticasone(Flonase),Metoprolol(Lopressor),Nabumetone(Relafen)                 Do not wear jewelrey  Do not wear lotions, powders, or perfumes.   Do not shave 48 hours prior to surgery. Men may shave face and neck.  Do not bring valuables to the hospital.  Contacts, dentures or bridgework may not be worn into surgery.  Leave suitcase in the car. After surgery it may be brought to your room.   For patients admitted to the hospital, checkout time is 11:00 AM the day of discharge.  Marland Kitchen   Special Instructions: Shower using CHG 2 nights before surgery and the night before surgery.  If you shower the day of surgery use CHG.  Use special wash - you have one bottle of CHG for all showers.  You should use approximately 1/3 of the bottle for each shower.   Please read over the following fact sheets that you were given: Pain Booklet, Coughing and Deep Breathing, Blood Transfusion Information, MRSA Information and Surgical Site Infection Prevention

## 2012-10-21 NOTE — H&P (Signed)
TOTAL HIP ADMISSION H&P  Patient is admitted for right total hip arthroplasty.  Subjective:  Chief Complaint: right hip pain  HPI: David Wright, 66 y.o. male, has a history of pain and functional disability in the right hip(s) due to arthritis and patient has failed non-surgical conservative treatments for greater than 12 weeks to include NSAID's and/or analgesics, flexibility and strengthening excercises, supervised PT with diminished ADL's post treatment, weight reduction as appropriate and activity modification.  Onset of symptoms was gradual starting 3 years ago with gradually worsening course since that time.The patient noted no past surgery on the right hip(s).  Patient currently rates pain in the right hip at 9 out of 10 with activity. Patient has night pain, worsening of pain with activity and weight bearing, trendelenberg gait, pain that interfers with activities of daily living and pain with passive range of motion. Patient has evidence of subchondral cysts, subchondral sclerosis, periarticular osteophytes and joint space narrowing by imaging studies. This condition presents safety issues increasing the risk of falls. This patient has had no previous hip surgery.  There is no current active infection.  There are no active problems to display for this patient.  Past Medical History  Diagnosis Date  . Anginal pain     has been worked-up but all neg.  Marland Kitchen Dysrhythmia     atril fib  . Asthma   . GERD (gastroesophageal reflux disease)   . Arthritis     Past Surgical History  Procedure Date  . Perianal fistula   . Eye surgery   . Tonsillectomy     No prescriptions prior to admission   No Known Allergies  History  Substance Use Topics  . Smoking status: Never Smoker   . Smokeless tobacco: Not on file  . Alcohol Use: Yes     Comment: occasionally    No family history on file.   Review of Systems  Constitutional: Negative.   HENT: Negative.   Eyes: Negative.   Respiratory:  Negative.   Cardiovascular: Positive for palpitations.       History of atrial fibrillation  Gastrointestinal: Negative.   Genitourinary: Negative.   Musculoskeletal: Negative.   Skin: Negative.   Neurological: Negative.   Endo/Heme/Allergies: Negative.   Psychiatric/Behavioral: Negative.     Objective:  Physical Exam  Constitutional: He appears well-nourished.  HENT:  Head: Atraumatic.  Eyes: EOM are normal.  Neck: Neck supple.  Cardiovascular: Regular rhythm.   Respiratory: Breath sounds normal.  GI: Bowel sounds are normal.  Musculoskeletal:       Right hip exam: Walks with an altered gait.  Rotation extremely limited of just a few degrees.  A 10degree hip flexion contracture.  Leg lengths appear to be roughly equal.  Good neurovascular status distally.  Neurological: He has normal reflexes.  Skin: Skin is warm.  Psychiatric: He has a normal mood and affect.    Vital signs in last 24 hours:    Labs:   There is no height or weight on file to calculate BMI.   Imaging Review Plain radiographs demonstrate severe degenerative joint disease of the right hip(s). The bone quality appears to be good for age and reported activity level.  Assessment/Plan:  End stage arthritis, right hip(s)  The patient history, physical examination, clinical judgement of the provider and imaging studies are consistent with end stage degenerative joint disease of the right hip(s) and total hip arthroplasty is deemed medically necessary. The treatment options including medical management, injection therapy, arthroscopy and arthroplasty  were discussed at length. The risks and benefits of total hip arthroplasty were presented and reviewed. The risks due to aseptic loosening, infection, stiffness, dislocation/subluxation,  thromboembolic complications and other imponderables were discussed.  The patient acknowledged the explanation, agreed to proceed with the plan and consent was signed. Patient is  being admitted for inpatient treatment for surgery, pain control, PT, OT, prophylactic antibiotics, VTE prophylaxis, progressive ambulation and ADL's and discharge planning.The patient is planning to be discharged home with home health services

## 2012-10-21 NOTE — Consult Note (Signed)
Anesthesia Chart Review:  Patient is a 66 year old male scheduled for right hip arthroplasty by Dr. Jerl Santos on 10/27/2012. History includes non-smoker, GERD, asthma, arthritis, paroxysmal atrial fibrillation, history of chest pain with normal stress test in July 2013 and in 2011.  Cardiologist is Dr. Mayford Knife, last visit was 08/28/12.  Her note indicates that he had a prior history of atrial fibrillation; however, a Holter monitor showed only PACs. She has been treating him with beta blocker therapy. He apparently had a negative nuclear stress test in 2011 by Dr. Anne Fu. His most recent exercise stress test on 06/08/2012 showed normal sinus rhythm, no EKG evidence of ischemia, negative for arrhythmia, normal blood pressure response.  Echo on 05/25/12 showed LVEF 72.1%, mild mitral valve regurgitation, aortic valve was sclerotic but opened well, trivial tricuspid regurgitation, mild pulmonic insufficiency.  EKG on 06/02/2012 showed sinus bradycardia @ 53 bpm, low voltage QRS.  Chest x-ray on 10/20/2012 showed no acute cardiopulmonary disease.  Preoperative labs noted.  No significant change in his status then anticipate he can proceed as planned.  Shonna Chock, PA-C

## 2012-10-26 MED ORDER — CEFAZOLIN SODIUM-DEXTROSE 2-3 GM-% IV SOLR
2.0000 g | INTRAVENOUS | Status: AC
  Administered 2012-10-27: 2 g via INTRAVENOUS
  Filled 2012-10-26: qty 50

## 2012-10-27 ENCOUNTER — Inpatient Hospital Stay (HOSPITAL_COMMUNITY)
Admission: RE | Admit: 2012-10-27 | Discharge: 2012-10-29 | DRG: 470 | Disposition: A | Discharge status: Home Health | Payer: Medicare Other | Source: Ambulatory Visit | Attending: Orthopaedic Surgery | Admitting: Orthopaedic Surgery

## 2012-10-27 ENCOUNTER — Encounter (HOSPITAL_COMMUNITY): Payer: Self-pay | Admitting: Vascular Surgery

## 2012-10-27 ENCOUNTER — Encounter (HOSPITAL_COMMUNITY): Payer: Self-pay | Admitting: *Deleted

## 2012-10-27 ENCOUNTER — Encounter (HOSPITAL_COMMUNITY)
Admission: RE | Disposition: A | Discharge status: Home Health | Payer: Self-pay | Source: Ambulatory Visit | Attending: Orthopaedic Surgery

## 2012-10-27 ENCOUNTER — Inpatient Hospital Stay (HOSPITAL_COMMUNITY): Payer: Medicare Other

## 2012-10-27 ENCOUNTER — Inpatient Hospital Stay (HOSPITAL_COMMUNITY): Payer: Medicare Other | Admitting: Vascular Surgery

## 2012-10-27 DIAGNOSIS — Z79899 Other long term (current) drug therapy: Secondary | ICD-10-CM | POA: Diagnosis not present

## 2012-10-27 DIAGNOSIS — Z7982 Long term (current) use of aspirin: Secondary | ICD-10-CM

## 2012-10-27 DIAGNOSIS — D62 Acute posthemorrhagic anemia: Secondary | ICD-10-CM | POA: Diagnosis not present

## 2012-10-27 DIAGNOSIS — M1610 Unilateral primary osteoarthritis, unspecified hip: Secondary | ICD-10-CM | POA: Diagnosis not present

## 2012-10-27 DIAGNOSIS — I4891 Unspecified atrial fibrillation: Secondary | ICD-10-CM | POA: Diagnosis present

## 2012-10-27 DIAGNOSIS — Z23 Encounter for immunization: Secondary | ICD-10-CM | POA: Diagnosis not present

## 2012-10-27 DIAGNOSIS — K219 Gastro-esophageal reflux disease without esophagitis: Secondary | ICD-10-CM | POA: Diagnosis not present

## 2012-10-27 DIAGNOSIS — M169 Osteoarthritis of hip, unspecified: Secondary | ICD-10-CM | POA: Diagnosis not present

## 2012-10-27 DIAGNOSIS — Z471 Aftercare following joint replacement surgery: Secondary | ICD-10-CM | POA: Diagnosis not present

## 2012-10-27 DIAGNOSIS — Z96649 Presence of unspecified artificial hip joint: Secondary | ICD-10-CM | POA: Diagnosis not present

## 2012-10-27 HISTORY — PX: TOTAL HIP ARTHROPLASTY: SHX124

## 2012-10-27 SURGERY — ARTHROPLASTY, HIP, TOTAL, ANTERIOR APPROACH
Anesthesia: General | Site: Hip | Laterality: Right | Wound class: Clean

## 2012-10-27 MED ORDER — HYDROMORPHONE HCL PF 1 MG/ML IJ SOLN
0.5000 mg | INTRAMUSCULAR | Status: DC | PRN
  Administered 2012-10-27: 1 mg via INTRAVENOUS
  Filled 2012-10-27: qty 1

## 2012-10-27 MED ORDER — ONDANSETRON HCL 4 MG/2ML IJ SOLN
4.0000 mg | Freq: Once | INTRAMUSCULAR | Status: AC | PRN
  Administered 2012-10-27: 4 mg via INTRAVENOUS

## 2012-10-27 MED ORDER — LACTATED RINGERS IV SOLN
INTRAVENOUS | Status: DC | PRN
  Administered 2012-10-27 (×3): via INTRAVENOUS

## 2012-10-27 MED ORDER — LACTATED RINGERS IV SOLN
INTRAVENOUS | Status: DC
  Administered 2012-10-27: 10:00:00 via INTRAVENOUS

## 2012-10-27 MED ORDER — ACETAMINOPHEN 650 MG RE SUPP: 650.0000 mg | Freq: Four times a day (QID) | RECTAL | Status: DC | PRN

## 2012-10-27 MED ORDER — DEXTROSE 5 % IV SOLN
500.0000 mg | Freq: Four times a day (QID) | INTRAVENOUS | Status: DC | PRN
  Filled 2012-10-27: qty 5

## 2012-10-27 MED ORDER — ALBUMIN HUMAN 5 % IV SOLN
INTRAVENOUS | Status: DC | PRN
  Administered 2012-10-27: 13:00:00 via INTRAVENOUS

## 2012-10-27 MED ORDER — GLYCOPYRROLATE 0.2 MG/ML IJ SOLN
INTRAMUSCULAR | Status: DC | PRN
  Administered 2012-10-27: 0.4 mg via INTRAVENOUS

## 2012-10-27 MED ORDER — HYDROMORPHONE HCL PF 1 MG/ML IJ SOLN
INTRAMUSCULAR | Status: AC
  Filled 2012-10-27: qty 1

## 2012-10-27 MED ORDER — SENNOSIDES-DOCUSATE SODIUM 8.6-50 MG PO TABS: 1.0000 | ORAL_TABLET | Freq: Every evening | ORAL | Status: DC | PRN

## 2012-10-27 MED ORDER — FLUTICASONE PROPIONATE 50 MCG/ACT NA SUSP
2.0000 | Freq: Every day | NASAL | Status: DC
  Administered 2012-10-27 – 2012-10-29 (×3): 2 via NASAL
  Filled 2012-10-27: qty 16

## 2012-10-27 MED ORDER — METOCLOPRAMIDE HCL 5 MG/ML IJ SOLN: 5.0000 mg | Freq: Three times a day (TID) | INTRAMUSCULAR | Status: DC | PRN

## 2012-10-27 MED ORDER — FLECAINIDE ACETATE 50 MG PO TABS
50.0000 mg | ORAL_TABLET | Freq: Two times a day (BID) | ORAL | Status: DC
  Administered 2012-10-27 – 2012-10-29 (×4): 50 mg via ORAL
  Filled 2012-10-27 (×5): qty 1

## 2012-10-27 MED ORDER — MENTHOL 3 MG MT LOZG: 1.0000 | LOZENGE | OROMUCOSAL | Status: DC | PRN

## 2012-10-27 MED ORDER — ONDANSETRON HCL 4 MG/2ML IJ SOLN
INTRAMUSCULAR | Status: DC | PRN
  Administered 2012-10-27: 4 mg via INTRAVENOUS

## 2012-10-27 MED ORDER — EPHEDRINE SULFATE 50 MG/ML IJ SOLN
INTRAMUSCULAR | Status: DC | PRN
  Administered 2012-10-27: 15 mg via INTRAVENOUS
  Administered 2012-10-27 (×2): 5 mg via INTRAVENOUS

## 2012-10-27 MED ORDER — PHENYLEPHRINE HCL 10 MG/ML IJ SOLN
10.0000 mg | INTRAVENOUS | Status: DC | PRN
  Administered 2012-10-27: 20 ug/min via INTRAVENOUS

## 2012-10-27 MED ORDER — METHOCARBAMOL 500 MG PO TABS
ORAL_TABLET | ORAL | Status: AC
  Filled 2012-10-27: qty 1

## 2012-10-27 MED ORDER — NEOSTIGMINE METHYLSULFATE 1 MG/ML IJ SOLN
INTRAMUSCULAR | Status: DC | PRN
  Administered 2012-10-27: 3 mg via INTRAVENOUS

## 2012-10-27 MED ORDER — ONDANSETRON HCL 4 MG/2ML IJ SOLN
INTRAMUSCULAR | Status: AC
  Filled 2012-10-27: qty 2

## 2012-10-27 MED ORDER — ONDANSETRON HCL 4 MG/2ML IJ SOLN: 4.0000 mg | Freq: Four times a day (QID) | INTRAMUSCULAR | Status: DC | PRN

## 2012-10-27 MED ORDER — METHOCARBAMOL 500 MG PO TABS
500.0000 mg | ORAL_TABLET | Freq: Four times a day (QID) | ORAL | Status: DC | PRN
  Administered 2012-10-27 – 2012-10-28 (×3): 500 mg via ORAL
  Filled 2012-10-27 (×2): qty 1

## 2012-10-27 MED ORDER — ZOLPIDEM TARTRATE 5 MG PO TABS: 5.0000 mg | ORAL_TABLET | Freq: Every evening | ORAL | Status: DC | PRN

## 2012-10-27 MED ORDER — PHENOL 1.4 % MT LIQD: 1.0000 | OROMUCOSAL | Status: DC | PRN

## 2012-10-27 MED ORDER — CEFAZOLIN SODIUM-DEXTROSE 2-3 GM-% IV SOLR
2.0000 g | Freq: Four times a day (QID) | INTRAVENOUS | Status: AC
  Administered 2012-10-27 – 2012-10-28 (×2): 2 g via INTRAVENOUS
  Filled 2012-10-27 (×2): qty 50

## 2012-10-27 MED ORDER — LIDOCAINE HCL (CARDIAC) 20 MG/ML IV SOLN
INTRAVENOUS | Status: DC | PRN
  Administered 2012-10-27: 60 mg via INTRAVENOUS

## 2012-10-27 MED ORDER — ALUM & MAG HYDROXIDE-SIMETH 200-200-20 MG/5ML PO SUSP: 30.0000 mL | ORAL | Status: DC | PRN

## 2012-10-27 MED ORDER — HYDROMORPHONE HCL PF 1 MG/ML IJ SOLN
0.2500 mg | INTRAMUSCULAR | Status: DC | PRN
  Administered 2012-10-27 (×3): 0.5 mg via INTRAVENOUS

## 2012-10-27 MED ORDER — INFLUENZA VIRUS VACC SPLIT PF IM SUSP
0.5000 mL | INTRAMUSCULAR | Status: AC
  Administered 2012-10-28: 0.5 mL via INTRAMUSCULAR
  Filled 2012-10-27: qty 0.5

## 2012-10-27 MED ORDER — DEXTROSE-NACL 5-0.2 % IV SOLN: INTRAVENOUS | Status: DC

## 2012-10-27 MED ORDER — HYDROCODONE-ACETAMINOPHEN 5-325 MG PO TABS
ORAL_TABLET | ORAL | Status: AC
  Filled 2012-10-27: qty 2

## 2012-10-27 MED ORDER — CHLORHEXIDINE GLUCONATE 4 % EX LIQD: 60.0000 mL | Freq: Once | CUTANEOUS | Status: DC

## 2012-10-27 MED ORDER — 0.9 % SODIUM CHLORIDE (POUR BTL) OPTIME
TOPICAL | Status: DC | PRN
  Administered 2012-10-27: 1000 mL

## 2012-10-27 MED ORDER — ASPIRIN EC 325 MG PO TBEC
325.0000 mg | DELAYED_RELEASE_TABLET | Freq: Two times a day (BID) | ORAL | Status: DC
  Administered 2012-10-28 – 2012-10-29 (×3): 325 mg via ORAL
  Filled 2012-10-27 (×5): qty 1

## 2012-10-27 MED ORDER — HYDROCODONE-ACETAMINOPHEN 5-325 MG PO TABS
1.0000 | ORAL_TABLET | ORAL | Status: DC | PRN
  Administered 2012-10-27 – 2012-10-29 (×6): 2 via ORAL
  Filled 2012-10-27 (×5): qty 2

## 2012-10-27 MED ORDER — MIDAZOLAM HCL 5 MG/5ML IJ SOLN
INTRAMUSCULAR | Status: DC | PRN
  Administered 2012-10-27: 1 mg via INTRAVENOUS

## 2012-10-27 MED ORDER — DOCUSATE SODIUM 100 MG PO CAPS
100.0000 mg | ORAL_CAPSULE | Freq: Two times a day (BID) | ORAL | Status: DC
  Administered 2012-10-27 – 2012-10-29 (×4): 100 mg via ORAL
  Filled 2012-10-27 (×5): qty 1

## 2012-10-27 MED ORDER — ACETAMINOPHEN 325 MG PO TABS
650.0000 mg | ORAL_TABLET | Freq: Four times a day (QID) | ORAL | Status: DC | PRN
  Administered 2012-10-28: 650 mg via ORAL
  Filled 2012-10-27: qty 2

## 2012-10-27 MED ORDER — FENTANYL CITRATE 0.05 MG/ML IJ SOLN
INTRAMUSCULAR | Status: DC | PRN
  Administered 2012-10-27 (×2): 50 ug via INTRAVENOUS
  Administered 2012-10-27: 100 ug via INTRAVENOUS
  Administered 2012-10-27: 50 ug via INTRAVENOUS

## 2012-10-27 MED ORDER — PROPOFOL 10 MG/ML IV BOLUS
INTRAVENOUS | Status: DC | PRN
  Administered 2012-10-27: 200 mg via INTRAVENOUS

## 2012-10-27 MED ORDER — ROCURONIUM BROMIDE 100 MG/10ML IV SOLN
INTRAVENOUS | Status: DC | PRN
  Administered 2012-10-27: 50 mg via INTRAVENOUS
  Administered 2012-10-27 (×4): 10 mg via INTRAVENOUS

## 2012-10-27 MED ORDER — ARTIFICIAL TEARS OP OINT
TOPICAL_OINTMENT | OPHTHALMIC | Status: DC | PRN
  Administered 2012-10-27: 1 via OPHTHALMIC

## 2012-10-27 MED ORDER — FLEET ENEMA 7-19 GM/118ML RE ENEM: 1.0000 | ENEMA | Freq: Once | RECTAL | Status: AC | PRN

## 2012-10-27 MED ORDER — METOCLOPRAMIDE HCL 10 MG PO TABS: 5.0000 mg | ORAL_TABLET | Freq: Three times a day (TID) | ORAL | Status: DC | PRN

## 2012-10-27 MED ORDER — DEXTROSE 5 % IV SOLN
INTRAVENOUS | Status: DC | PRN
  Administered 2012-10-27: 11:00:00 via INTRAVENOUS

## 2012-10-27 MED ORDER — METOPROLOL TARTRATE 12.5 MG HALF TABLET
12.5000 mg | ORAL_TABLET | Freq: Two times a day (BID) | ORAL | Status: DC
Start: 2012-10-27 — End: 2012-10-29
  Administered 2012-10-27 – 2012-10-29 (×4): 12.5 mg via ORAL
  Filled 2012-10-27 (×5): qty 1

## 2012-10-27 MED ORDER — ONDANSETRON HCL 4 MG PO TABS: 4.0000 mg | ORAL_TABLET | Freq: Four times a day (QID) | ORAL | Status: DC | PRN

## 2012-10-27 SURGICAL SUPPLY — 52 items
BLADE SAW SGTL 18X1.27X75 (BLADE) ×2 IMPLANT
BLADE SURG ROTATE 9660 (MISCELLANEOUS) IMPLANT
CELLS DAT CNTRL 66122 CELL SVR (MISCELLANEOUS) ×1 IMPLANT
CLOTH BEACON ORANGE TIMEOUT ST (SAFETY) ×2 IMPLANT
COVER BACK TABLE 24X17X13 BIG (DRAPES) IMPLANT
COVER SURGICAL LIGHT HANDLE (MISCELLANEOUS) ×2 IMPLANT
DRAPE C-ARM 42X72 X-RAY (DRAPES) ×2 IMPLANT
DRAPE STERI IOBAN 125X83 (DRAPES) ×2 IMPLANT
DRAPE U-SHAPE 47X51 STRL (DRAPES) ×6 IMPLANT
DRSG MEPILEX BORDER 4X12 (GAUZE/BANDAGES/DRESSINGS) IMPLANT
DRSG MEPILEX BORDER 4X8 (GAUZE/BANDAGES/DRESSINGS) ×2 IMPLANT
DURAPREP 26ML APPLICATOR (WOUND CARE) ×2 IMPLANT
ELECT BLADE 4.0 EZ CLEAN MEGAD (MISCELLANEOUS)
ELECT BLADE TIP CTD 4 INCH (ELECTRODE) ×2 IMPLANT
ELECT CAUTERY BLADE 6.4 (BLADE) ×2 IMPLANT
ELECT REM PT RETURN 9FT ADLT (ELECTROSURGICAL) ×2
ELECTRODE BLDE 4.0 EZ CLN MEGD (MISCELLANEOUS) IMPLANT
ELECTRODE REM PT RTRN 9FT ADLT (ELECTROSURGICAL) ×1 IMPLANT
FACESHIELD LNG OPTICON STERILE (SAFETY) ×4 IMPLANT
GAUZE XEROFORM 1X8 LF (GAUZE/BANDAGES/DRESSINGS) ×2 IMPLANT
GLOVE BIO SURGEON STRL SZ8 (GLOVE) ×2 IMPLANT
GLOVE BIO SURGEON STRL SZ8.5 (GLOVE) ×2 IMPLANT
GLOVE BIOGEL PI IND STRL 8 (GLOVE) ×1 IMPLANT
GLOVE BIOGEL PI IND STRL 8.5 (GLOVE) ×1 IMPLANT
GLOVE BIOGEL PI INDICATOR 8 (GLOVE) ×1
GLOVE BIOGEL PI INDICATOR 8.5 (GLOVE) ×1
GOWN PREVENTION PLUS LG XLONG (DISPOSABLE) IMPLANT
GOWN STRL NON-REIN LRG LVL3 (GOWN DISPOSABLE) ×4 IMPLANT
GOWN STRL REIN XL XLG (GOWN DISPOSABLE) ×2 IMPLANT
HEAD CERAMIC DELTA 36 PLUS 1.5 (Hips) ×2 IMPLANT
KIT BASIN OR (CUSTOM PROCEDURE TRAY) ×2 IMPLANT
KIT ROOM TURNOVER OR (KITS) ×2 IMPLANT
MANIFOLD NEPTUNE II (INSTRUMENTS) ×2 IMPLANT
NS IRRIG 1000ML POUR BTL (IV SOLUTION) ×2 IMPLANT
PACK TOTAL JOINT (CUSTOM PROCEDURE TRAY) ×2 IMPLANT
PAD ARMBOARD 7.5X6 YLW CONV (MISCELLANEOUS) ×4 IMPLANT
RTRCTR WOUND ALEXIS 18CM MED (MISCELLANEOUS) ×2
SPONGE LAP 18X18 X RAY DECT (DISPOSABLE) ×2 IMPLANT
SPONGE LAP 4X18 X RAY DECT (DISPOSABLE) IMPLANT
STAPLER VISISTAT 35W (STAPLE) ×2 IMPLANT
SUT ETHIBOND NAB CT1 #1 30IN (SUTURE) ×4 IMPLANT
SUT VIC AB 0 CT1 27 (SUTURE)
SUT VIC AB 0 CT1 27XBRD ANBCTR (SUTURE) IMPLANT
SUT VIC AB 1 CT1 27 (SUTURE) ×1
SUT VIC AB 1 CT1 27XBRD ANBCTR (SUTURE) ×1 IMPLANT
SUT VIC AB 2-0 CT1 27 (SUTURE) ×1
SUT VIC AB 2-0 CT1 TAPERPNT 27 (SUTURE) ×1 IMPLANT
SUT VLOC 180 0 24IN GS25 (SUTURE) ×2 IMPLANT
TOWEL OR 17X24 6PK STRL BLUE (TOWEL DISPOSABLE) ×2 IMPLANT
TOWEL OR 17X26 10 PK STRL BLUE (TOWEL DISPOSABLE) ×4 IMPLANT
TRAY FOLEY CATH 14FR (SET/KITS/TRAYS/PACK) IMPLANT
WATER STERILE IRR 1000ML POUR (IV SOLUTION) ×4 IMPLANT

## 2012-10-27 NOTE — Interval H&P Note (Signed)
History and Physical Interval Note:  10/27/2012 9:53 AM  David Wright  has presented today for surgery, with the diagnosis of RIGHT HIP [facility] JOINT DISEASE  The various methods of treatment have been discussed with the patient and family. After consideration of risks, benefits and other options for treatment, the patient has consented to  Procedure(s) (LRB) with comments: TOTAL HIP ARTHROPLASTY ANTERIOR APPROACH (Right) as a surgical intervention .  The patient's history has been reviewed, patient examined, no change in status, stable for surgery.  I have reviewed the patient's chart and labs.  Questions were answered to the patient's satisfaction.     Quintavia Rogstad G

## 2012-10-27 NOTE — Op Note (Signed)
PRE-OP DIAGNOSIS:  RIGHT HIP DEGENERATIVE JOINT DISEASE POST-OP DIAGNOSIS:  RIGHT HIP DEGENERATIVE JOINT DISEASE PROCEDURE:  Procedure(s): RIGHT TOTAL HIP ARTHROPLASTY ANTERIOR APPROACH ANESTHESIA:  General SURGEON:  Marcene Corning MD ASSISTANT:  Lindwood Qua PA-C   INDICATIONS FOR PROCEDURE:  The patient is a 66 y.o. male with a long history of a painful hip.  This has persisted despite multiple conservative measures.  The patient has persisted with pain and dysfunction making rest and activity difficult.  A total hip replacement is offered as surgical treatment.  Informed operative consent was obtained after discussion of possible complications including reaction to anesthesia, infection, neurovascular injury, dislocation, DVT, PE, and death.  The importance of the postoperative rehab program to optimize result was stressed with the patient.  SUMMARY OF FINDINGS AND PROCEDURE:  Under general anesthesia through a anterior approach an the Hana table a right THR was performed.  The patient had severe degenerative change and excellent bone quality.  We used DePuy components to replace the hip and these were size KA12 Corail femur capped with a -2 36mm stainless steel hip ball.  On the acetabular side we used a size 54 Gription shell with a  plus 4 neutral polyethylene liner.  We did use a hole eliminator.  Bryna Colander assisted throughout and was invaluable to the completion of the case in that he helped position and retract while I performed the procedure.  He also closed simultaneously to help minimize OR time.  I used fluoroscopy throughout the case to check position of implants and leg lengths and read all of these views myself.  DESCRIPTION OF PROCEDURE:  The patient was taken to the OR suite where general anesthetic was applied.  The patient was then positioned on the Hana table supine.  All bony prominences were appropriately padded.  Prep and drape was then performed in normal sterile  fashion.  The patient was given Kefzol preoperative antibiotic and an appropriate time out was performed.  We then took an anterior approach to the right hip.  Dissection was taken through adipose to the tensor fascia lata fascia.  This structure was incised longitudinally and we dissected in the intermuscular interval just medial to this muscle.  Cobra retractors were placed superior and inferior to the femoral neck superficial to the capsule.  A capsular incision was then made and the retractors were placed along the femoral neck.  Xray was brought in to get a good level for the femoral neck cut which was made with an oscillating saw and osteotome.  The femoral head was removed with a corkscrew.  The acetabulum was exposed and some labral tissues were excised. Reaming was taken to the inside wall of the pelvis and sequentially up to 1 mm smaller than the actual component.  A trial of components was done and then the aforementioned acetabular shell was placed in appropriate tilt and anteversion confirmed by fluoroscopy. The liner was placed along with the hole eliminator and attention was turned to the femur.  The leg was brought down and over into adduction and the elevator bar was used to raise the femur up gently in the wound.  The piriformis was released with care taken to preserve the obturator internus attachment and all of the posterior capsule. The femur was reamed and then broached to the appropriate size.  A trial reduction was done and the aforementioned head and neck assembly gave [place] the best stability in extension with external rotation.  Leg lengths were felt to be  about equal by fluoroscopic exam.  The trial components were removed and the wound irrigated.  We then placed the femoral component in appropriate anteversion.  The head was applied to a dry stem neck and the hip again reduced.  It was again stable in the aforementioned position.  It did measure a few mm long with the initial ceramic +1.5  hip ball on fluoro and we elected to place the -2 metal hip ball equalizing the leg lengths on repeat fluoro exam. The would was irrigated again followed by re-approximation of anterior capsule with ethibond suture. Tensor fascia was repaired with V-loc suture  followed by subcutaneous closure with #O and #2 undyed vicryl.  Skin was closed with staples followed by a sterile dressing.  EBL and IOF can be obtained from anesthesia records.  DISPOSITION:  The patient was extubated in the OR and taken to PACU in stable condition to be admitted to the Orthopedic Surgery for appropriate post-op care to include perioperative antibiotics and DVT prophylaxis.

## 2012-10-27 NOTE — Preoperative (Signed)
Beta Blockers   Reason not to administer Beta Blockers:Not Applicable 

## 2012-10-27 NOTE — Transfer of Care (Signed)
Immediate Anesthesia Transfer of Care Note  Patient: David Wright  Procedure(s) Performed: Procedure(s) (LRB) with comments: TOTAL HIP ARTHROPLASTY ANTERIOR APPROACH (Right)  Patient Location: PACU  Anesthesia Type:General  Level of Consciousness: awake, alert  and oriented  Airway & Oxygen Therapy: Patient Spontanous Breathing and Patient connected to nasal cannula oxygen  Post-op Assessment: Report given to PACU RN and Post -op Vital signs reviewed and stable  Post vital signs: Reviewed and stable  Complications: No apparent anesthesia complications

## 2012-10-27 NOTE — Anesthesia Preprocedure Evaluation (Addendum)
Anesthesia Evaluation  Patient identified by MRN, date of birth, ID band Patient awake    Reviewed: Allergy & Precautions, H&P , NPO status , Patient's Chart, lab work & pertinent test results  Airway Mallampati: I TM Distance: >3 FB Neck ROM: full    Dental   Pulmonary asthma ,          Cardiovascular + dysrhythmias Atrial Fibrillation Rhythm:regular Rate:Normal     Neuro/Psych negative neurological ROS  negative psych ROS   GI/Hepatic Neg liver ROS, GERD-  ,  Endo/Other  negative endocrine ROS  Renal/GU negative Renal ROS     Musculoskeletal  (+) Arthritis -, Osteoarthritis,    Abdominal   Peds  Hematology negative hematology ROS (+)   Anesthesia Other Findings   Reproductive/Obstetrics                          Anesthesia Physical Anesthesia Plan  ASA: III  Anesthesia Plan: General   Post-op Pain Management:    Induction: Intravenous  Airway Management Planned: Oral ETT  Additional Equipment:   Intra-op Plan:   Post-operative Plan: Extubation in OR  Informed Consent: I have reviewed the patients History and Physical, chart, labs and discussed the procedure including the risks, benefits and alternatives for the proposed anesthesia with the patient or authorized representative who has indicated his/her understanding and acceptance.   Dental advisory given  Plan Discussed with: CRNA, Anesthesiologist and Surgeon  Anesthesia Plan Comments:        Anesthesia Quick Evaluation

## 2012-10-27 NOTE — Anesthesia Postprocedure Evaluation (Signed)
Anesthesia Post Note  Patient: David Wright  Procedure(s) Performed: Procedure(s) (LRB): TOTAL HIP ARTHROPLASTY ANTERIOR APPROACH (Right)  Anesthesia type: GA  Patient location: PACU  Post pain: Pain level controlled  Post assessment: Post-op Vital signs reviewed  Last Vitals:  Filed Vitals:   10/27/12 1445  BP: 98/46  Pulse: 58  Temp:   Resp: 18    Post vital signs: Reviewed  Level of consciousness: sedated  Complications: No apparent anesthesia complications

## 2012-10-27 NOTE — Anesthesia Procedure Notes (Signed)
Procedure Name: Intubation Date/Time: 10/27/2012 11:15 AM Performed by: Julianne Rice K Pre-anesthesia Checklist: Patient identified, Timeout performed, Emergency Drugs available, Suction available and Patient being monitored Patient Re-evaluated:Patient Re-evaluated prior to inductionOxygen Delivery Method: Circle system utilized Preoxygenation: Pre-oxygenation with 100% oxygen Intubation Type: IV induction Ventilation: Mask ventilation without difficulty Laryngoscope Size: Mac and 3 Grade View: Grade II Tube type: Oral Tube size: 8.0 mm Number of attempts: 1 Airway Equipment and Method: Stylet and LTA kit utilized Placement Confirmation: ETT inserted through vocal cords under direct vision,  breath sounds checked- equal and bilateral and positive ETCO2 Secured at: 24 cm Tube secured with: Tape Dental Injury: Teeth and Oropharynx as per pre-operative assessment

## 2012-10-28 ENCOUNTER — Encounter (HOSPITAL_COMMUNITY): Payer: Self-pay | Admitting: General Practice

## 2012-10-28 DIAGNOSIS — D62 Acute posthemorrhagic anemia: Secondary | ICD-10-CM | POA: Diagnosis not present

## 2012-10-28 LAB — BASIC METABOLIC PANEL
BUN: 11 mg/dL (ref 6–23)
Creatinine, Ser: 0.86 mg/dL (ref 0.50–1.35)
GFR calc Af Amer: >90 mL/min (ref >90)
GFR calc non Af Amer: 87 mL/min — ABNORMAL LOW (ref >90)
Potassium: 3.4 mEq/L — ABNORMAL LOW (ref 3.5–5.1)

## 2012-10-28 LAB — CBC
HCT: 31.6 % — ABNORMAL LOW (ref 39.0–52.0)
MCHC: 33.5 g/dL (ref 30.0–36.0)
Platelets: 153 10*3/uL (ref 150–400)
RDW: 13.1 % (ref 11.5–15.5)

## 2012-10-28 NOTE — Progress Notes (Signed)
Occupational Therapy Evaluation Patient Details Name: David Wright MRN: [id number] DOB: [birth year] Today's Date: 10/28/2012 Time: 7829-5621 OT Time Calculation (min): 23 min  OT Assessment / Plan / Recommendation Clinical Impression  66 yo s/p Direct Ant THA - no hip precautions. Discussed availability of AE to assist with ADL. Educated pt on use of DME for bathroom, including shower transfer. Pt will have adequate help for D/C tomorrow.     OT Assessment  Patient does not need any further OT services    Follow Up Recommendations  No OT follow up    Barriers to Discharge  none    Equipment Recommendations  None recommended by OT    Recommendations for Other Services  none  Frequency    eval only   Precautions / Restrictions Precautions Precautions: Anterior Hip Precaution Booklet Issued:  (Direct Ant - no hip precautions) Precaution Comments: No hip precautions Restrictions RLE Weight Bearing: Weight bearing as tolerated   Pertinent Vitals/Pain 3    ADL  Eating/Feeding: Independent Where Assessed - Eating/Feeding: Bed level Grooming: Modified independent Where Assessed - Grooming: Unsupported standing Upper Body Bathing: Set up;Supervision/safety Where Assessed - Upper Body Bathing: Unsupported sitting Lower Body Bathing: Minimal assistance Where Assessed - Lower Body Bathing: Unsupported sit to stand Upper Body Dressing: Set up;Supervision/safety Where Assessed - Upper Body Dressing: Unsupported sitting Lower Body Dressing: Minimal assistance Where Assessed - Lower Body Dressing: Unsupported sit to stand Toilet Transfer: Supervision/safety Toilet Transfer Method: Sit to stand;Stand pivot Acupuncturist: Bedside commode Toileting - Clothing Manipulation and Hygiene: Supervision/safety Where Assessed - Engineer, mining and Hygiene: Standing Tub/Shower Transfer: Supervision/safety Tub/Shower Transfer Method: Ambulating Transfers/Ambulation  Related to ADLs: S ADL Comments: Educated pt on available AE to assist with ADL. Discussed direct anterior approach performed by Dr. Yisroel Ramming and no hip precautions. Educated pt on shower transfer technique    OT Diagnosis:    OT Problem List:   OT Treatment Interventions:     OT Goals Acute Rehab OT Goals OT Goal Formulation:  (eval only)  Visit Information  Last OT Received On: 10/28/12 Assistance Needed: +1    Subjective Data      Prior Functioning     Home Living Lives With: Spouse Available Help at Discharge: Family;Available 24 hours/day Type of Home: House Home Access: Stairs to enter [employer] of Steps: 3 Entrance Stairs-Rails: Right Home Layout: Two level;Able to live on main level with bedroom/bathroom Bathroom Shower/Tub: Walk-in shower;Door [employer] Toilet: Pharmacist, community: Yes How Accessible: Accessible via walker Home Adaptive Equipment: Bedside commode/3-in-1;Walker - rolling Prior Function Level of Independence: Independent Able to Take Stairs?: Yes Driving: Yes Vocation: Retired Musician: No difficulties Dominant Hand: Right         Vision/Perception     Cognition  Overall Cognitive Status: Appears within functional limits for tasks assessed/performed Arousal/Alertness: Awake/alert Orientation Level: Appears intact for tasks assessed Behavior During Session: [facility] for tasks performed    Extremity/Trunk Assessment Right Upper Extremity Assessment RUE ROM/Strength/Tone: WFL for tasks assessed RUE Sensation: WFL - Light Touch;WFL - Proprioception RUE Coordination: WFL - gross/fine motor Left Upper Extremity Assessment LUE ROM/Strength/Tone: WFL for tasks assessed LUE Sensation: WFL - Light Touch;WFL - Proprioception LUE Coordination: WFL - gross/fine motor Right Lower Extremity Assessment RLE ROM/Strength/Tone: Deficits Left Lower Extremity Assessment LLE ROM/Strength/Tone: WFL for tasks  assessed LLE Sensation: WFL - Light Touch;WFL - Proprioception LLE Coordination: WFL - gross/fine motor     Mobility Transfers Sit to Stand: 5: Supervision Stand  to Sit: 5: Supervision     Shoulder Instructions     Exercise     Balance     End of Session OT - End of Session Activity Tolerance: Patient tolerated treatment well Patient left: in bed;with call bell/phone within reach Nurse Communication: Mobility status  GO     Merelyn Klump,HILLARY 10/28/2012, 5:06 PM [facility], OTR/L  [phone] 10/28/2012

## 2012-10-28 NOTE — Progress Notes (Signed)
Subjective: 1 Day Post-Op Procedure(s) (LRB): TOTAL HIP ARTHROPLASTY ANTERIOR APPROACH (Right) ABLA -symptom free at this point. Will follow  Activity level:  oob already- no problems Diet tolerance:  eating Voiding:  ok Patient reports pain as 3 on 0-10 scale.    Objective: Vital signs in last 24 hours: Temp:  [97.5 F (36.4 C)-98.6 F (37 C)] 98.6 F (37 C) (12/04 0558) Pulse Rate:  [57-70] 58  (12/04 0558) Resp:  [13-18] 18  (12/04 0400) BP: (89-155)/(46-89) 93/52 mmHg (12/04 0558) SpO2:  [97 %-100 %] 97 % (12/04 0558)  Labs:  [facility] 10/28/12 0628  HGB 10.6*    Basename 10/28/12 0628  WBC 6.7  RBC 3.39*  HCT 31.6*  PLT 153    Basename 10/28/12 0628  NA 137  K 3.4*  CL 103  CO2 26  BUN 11  CREATININE 0.86  GLUCOSE 123*  CALCIUM 8.1*   No results found for this basename: LABPT:2,INR:2 in the last 72 hours  Physical Exam:  Neurologically intact ABD soft Neurovascular intact Sensation intact distally Intact pulses distally Dorsiflexion/Plantar flexion intact Incision: dressing C/D/I and no drainage No cellulitis present Compartment soft  Assessment/Plan:  1 Day Post-Op Procedure(s) (LRB): TOTAL HIP ARTHROPLASTY ANTERIOR APPROACH (Right) Advance diet Up with therapy D/C IV fluids Plan for discharge tomorrow with home therapy CBC in am ASA 325 bid x 4 weeks    Abel Hageman R 10/28/2012, 8:14 AM

## 2012-10-28 NOTE — Progress Notes (Signed)
PT Treatment Note:   10/28/12 1232  PT Visit Information  Last PT Received On 10/28/12  Assistance Needed +1  PT Time Calculation  PT Start Time 1154  PT Stop Time 1212  PT Time Calculation (min) 18 min  Subjective Data  Subjective "Hanging in there."  Patient Stated Goal Go home.  Precautions  Precautions Anterior Hip  Precaution Booklet Issued No  Precaution Comments Pt able to recall 3/3 anterior hip precautions.  Restrictions  Weight Bearing Restrictions Yes  RLE Weight Bearing WBAT  Cognition  Overall Cognitive Status Appears within functional limits for tasks assessed/performed  Arousal/Alertness Awake/alert  Orientation Level Appears intact for tasks assessed  Behavior During Session [facility] for tasks performed  Bed Mobility  Bed Mobility Supine to Sit;Sit to Supine  Supine to Sit 4: Min assist;HOB flat  Sit to Supine 4: Min assist;HOB flat  Details for Bed Mobility Assistance Assist for right LE due to pain. Pt able to verbalize sequence to protect hip.  Transfers  Transfers Sit to Stand;Stand to Sit  Sit to Stand 5: Supervision;With upper extremity assist;From bed  Stand to Sit 5: Supervision;With upper extremity assist;To bed  Details for Transfer Assistance Verbal cues for safest hand placement.  Ambulation/Gait  Ambulation/Gait Assistance 5: Supervision  Ambulation Distance (Feet) 400 Feet  Assistive device Rolling walker  Ambulation/Gait Assistance Details Verbal cues for tall posture and safe sequence.  Gait Pattern Step-to pattern;Decreased step length - right;Decreased stance time - right  Stairs No  Engineering geologist No  Balance  Balance Assessed No  Exercises  Exercises Total Joint  Total Joint Exercises  Short Arc Quad AROM;Right;10 reps;Supine  Long Arc Quad AROM;Right;10 reps;Supine  Knee Flexion AROM;Right;10 reps;Standing  Marching in Standing AROM;Right;10 reps;Standing  PT - End of Session  Equipment Utilized During  Treatment Gait belt  Activity Tolerance Patient tolerated treatment well  Patient left in bed;with call bell/phone within reach;with family/visitor present  Nurse Communication Mobility status  PT - Assessment/Plan  Comments on Treatment Session Pt admitted s/p right THA and continues to progress. Pt able to tolerate significant ambulation distance BID today as well as therapeutic exercises. Motivated!  PT Plan Discharge plan remains appropriate;Frequency remains appropriate  PT Frequency 7X/week  Follow Up Recommendations Home health PT  Equipment Recommended None recommended by PT  Acute Rehab PT Goals  PT Goal Formulation With patient  Time For Goal Achievement 11/04/12  Potential to Achieve Goals Good  PT Goal: Supine/Side to Sit - Progress Progressing toward goal  PT Goal: Sit to Supine/Side - Progress Progressing toward goal  PT Goal: Sit to Stand - Progress Progressing toward goal  PT Goal: Stand to Sit - Progress Progressing toward goal  PT Goal: Ambulate - Progress Progressing toward goal  PT Goal: Perform Home Exercise Program - Progress Progressing toward goal  PT General Charges  $$ ACUTE PT VISIT 1 Procedure  PT Treatments  $Gait Training 8-22 mins     10/28/2012 Cephus Shelling, PT, DPT [phone]

## 2012-10-28 NOTE — Progress Notes (Signed)
UR COMPLETED  

## 2012-10-28 NOTE — Evaluation (Signed)
Physical Therapy Evaluation Patient Details Name: David Wright MRN: [id number] DOB: 10/02/1946 Today's Date: 10/28/2012 Time: 1308-6578 PT Time Calculation (min): 24 min  PT Assessment / Plan / Recommendation Clinical Impression  Pt admitted s/p right THA along with the below PT problem list. Pt would benefit from acute PT to maximize independence and facilitate d/c home with HHPT.    PT Assessment  Patient needs continued PT services    Follow Up Recommendations  Home health PT    Does the patient have the potential to tolerate intense rehabilitation      Barriers to Discharge None      Equipment Recommendations  None recommended by PT    Recommendations for Other Services     Frequency 7X/week    Precautions / Restrictions Precautions Precautions: Anterior Hip Precaution Booklet Issued: Yes (comment) Precaution Comments: Educated on 3/3 anterior hip precautions. Restrictions Weight Bearing Restrictions: Yes RLE Weight Bearing: Weight bearing as tolerated   Pertinent Vitals/Pain 8/10 in right hip. Pt repositioned.      Mobility  Bed Mobility Bed Mobility: Supine to Sit Supine to Sit: 4: Min assist;HOB flat Details for Bed Mobility Assistance: Assist for right LE due to pain. Cues for safe sequence to protect hip. Transfers Transfers: Sit to Stand;Stand to Sit Sit to Stand: 4: Min guard;With upper extremity assist;From bed Stand to Sit: 4: Min guard;With upper extremity assist;To chair/3-in-1 Details for Transfer Assistance: Guarding for balance with cues for safest hand/right LE placement and slow descent to surface. Ambulation/Gait Ambulation/Gait Assistance: 4: Min guard Ambulation Distance (Feet): 400 Feet Assistive device: Rolling walker Ambulation/Gait Assistance Details: Guarding for balance with cues for proper step-to sequence to maintain anterior hip precautions and protect hip. Gait Pattern: Step-to pattern;Decreased step length - right;Decreased  stance time - right Stairs: No Wheelchair Mobility Wheelchair Mobility: No    Shoulder Instructions     Exercises Total Joint Exercises Ankle Circles/Pumps: AROM;Both;10 reps;Supine Quad Sets: AROM;Both;10 reps;Supine Heel Slides: AAROM;Right;10 reps;Supine   PT Diagnosis: Difficulty walking;Acute pain  PT Problem List: Decreased strength;Decreased range of motion;Decreased activity tolerance;Decreased balance;Decreased mobility;Decreased knowledge of use of DME;Decreased knowledge of precautions;Pain PT Treatment Interventions: DME instruction;Gait training;Stair training;Functional mobility training;Therapeutic activities;Therapeutic exercise;Balance training;Patient/family education   PT Goals Acute Rehab PT Goals PT Goal Formulation: With patient Time For Goal Achievement: 11/04/12 Potential to Achieve Goals: Good Pt will go Supine/Side to Sit: with modified independence PT Goal: Supine/Side to Sit - Progress: Goal set today Pt will go Sit to Supine/Side: with modified independence PT Goal: Sit to Supine/Side - Progress: Goal set today Pt will go Sit to Stand: with modified independence PT Goal: Sit to Stand - Progress: Goal set today Pt will go Stand to Sit: with modified independence PT Goal: Stand to Sit - Progress: Goal set today Pt will Ambulate: >150 feet;with modified independence;with least restrictive assistive device PT Goal: Ambulate - Progress: Goal set today Pt will Go Up / Down Stairs: 3-5 stairs;with modified independence;with least restrictive assistive device PT Goal: Up/Down Stairs - Progress: Goal set today Pt will Perform Home Exercise Program: Independently PT Goal: Perform Home Exercise Program - Progress: Goal set today  Visit Information  Last PT Received On: 10/28/12 Assistance Needed: +1    Subjective Data  Subjective: "Really sore this morning." Patient Stated Goal: Go home.   Prior Functioning  Home Living Lives With: Spouse Available  Help at Discharge: Family;Available 24 hours/day Type of Home: House Home Access: Stairs to enter [employer] of Steps: 3  Entrance Stairs-Rails: Right Home Layout: Two level;Able to live on main level with bedroom/bathroom Bathroom Shower/Tub: Walk-in shower;Door [employer] Toilet: Standard Home Adaptive Equipment: Bedside commode/3-in-1;Walker - rolling Prior Function Level of Independence: Independent Able to Take Stairs?: Yes Driving: Yes Vocation: Retired Musician: No difficulties Dominant Hand: Right    Cognition  Overall Cognitive Status: Appears within functional limits for tasks assessed/performed Arousal/Alertness: Awake/alert Orientation Level: Appears intact for tasks assessed Behavior During Session: [facility] for tasks performed    Extremity/Trunk Assessment Right Upper Extremity Assessment RUE ROM/Strength/Tone: Within functional levels RUE Sensation: WFL - Light Touch RUE Coordination: WFL - gross motor Left Upper Extremity Assessment LUE ROM/Strength/Tone: Within functional levels LUE Sensation: WFL - Light Touch LUE Coordination: WFL - gross motor Right Lower Extremity Assessment RLE ROM/Strength/Tone: Deficits;Due to pain RLE ROM/Strength/Tone Deficits: 3/5 throughout. RLE Sensation: WFL - Light Touch RLE Coordination: WFL - gross motor Left Lower Extremity Assessment LLE ROM/Strength/Tone: Within functional levels LLE Sensation: WFL - Light Touch LLE Coordination: WFL - gross motor Trunk Assessment Trunk Assessment: Normal   Balance Balance Balance Assessed: No  End of Session PT - End of Session Equipment Utilized During Treatment: Gait belt Activity Tolerance: Patient tolerated treatment well Patient left: in chair;with call bell/phone within reach Nurse Communication: Mobility status  GP     Cephus Shelling 10/28/2012, 9:27 AM  10/28/2012 Cephus Shelling, PT, DPT [phone]

## 2012-10-28 NOTE — Progress Notes (Signed)
CARE MANAGEMENT NOTE 10/28/2012  Patient:  ITAI, BARBIAN   Account Number:  [id number]  Date Initiated:  10/28/2012  Documentation initiated by:  Vance Peper  Subjective/Objective Assessment:   66 yr old male s/p right total hip arthroplasty.     Action/Plan:   CM spoke with patient and wife concerning home Health and DME needs at discharge.Patient has rolling walker and 3in1, requests a shower bench.   Anticipated DC Date:  10/29/2012   Anticipated DC Plan:  HOME W HOME HEALTH SERVICES      DC Planning Services  CM consult      [facility] Choice  HOME HEALTH  DURABLE MEDICAL EQUIPMENT   Choice offered to / List presented to:  C-1 Patient   DME arranged  [employer]      DME agency  [employer]     HH arranged  HH-2 PT      [facility] agency  [facility]   Status of service:  Completed, signed off Medicare Important Message given?   (If response is "NO", the following Medicare IM given date fields will be blank) Date Medicare IM given:   Date Additional Medicare IM given:    Discharge Disposition:  HOME W HOME HEALTH SERVICES  Per UR Regulation:    If discussed at Long Length of Stay Meetings, dates discussed:    Comments:

## 2012-10-29 LAB — CBC
Hemoglobin: 10.3 g/dL — ABNORMAL LOW (ref 13.0–17.0)
MCHC: 34 g/dL (ref 30.0–36.0)
RDW: 13.1 % (ref 11.5–15.5)

## 2012-10-29 MED ORDER — HYDROCODONE-ACETAMINOPHEN 5-325 MG PO TABS: 1.0000 | ORAL_TABLET | ORAL | Status: DC | PRN

## 2012-10-29 MED ORDER — METHOCARBAMOL 500 MG PO TABS: 500.0000 mg | ORAL_TABLET | Freq: Four times a day (QID) | ORAL | Status: DC | PRN

## 2012-10-29 MED ORDER — ASPIRIN 325 MG PO TBEC: 325.0000 mg | DELAYED_RELEASE_TABLET | Freq: Two times a day (BID) | ORAL | Status: DC

## 2012-10-29 NOTE — Progress Notes (Signed)
Subjective: 2 Days Post-Op Procedure(s) (LRB): TOTAL HIP ARTHROPLASTY ANTERIOR APPROACH (Right) abla - no symptoms Activity level:  oob Diet tolerance:  ok Voiding:  ok Patient reports pain as 1 on 0-10 scale.    Objective: Vital signs in last 24 hours: Temp:  [98.2 F (36.8 C)-99.4 F (37.4 C)] 99.4 F (37.4 C) (12/05 1300) Pulse Rate:  [73-84] 73  (12/05 1300) Resp:  [20] 20  (12/05 1300) BP: (104-115)/(52-60) 104/60 mmHg (12/05 1300) SpO2:  [97 %-99 %] 99 % (12/05 1300) Weight:  [73.936 kg (163 lb)] 73.936 kg (163 lb) (12/04 2140)  Labs:  [facility] 10/29/12 0520 10/28/12 0628  HGB 10.3* 10.6*    Basename 10/29/12 0520 10/28/12 0628  WBC 9.1 6.7  RBC 3.27* 3.39*  HCT 30.3* 31.6*  PLT 141* 153    Basename 10/28/12 0628  NA 137  K 3.4*  CL 103  CO2 26  BUN 11  CREATININE 0.86  GLUCOSE 123*  CALCIUM 8.1*   No results found for this basename: LABPT:2,INR:2 in the last 72 hours  Physical Exam:  Neurologically intact ABD soft Neurovascular intact Sensation intact distally Intact pulses distally Dorsiflexion/Plantar flexion intact No cellulitis present Compartment soft  Assessment/Plan:  2 Days Post-Op Procedure(s) (LRB): TOTAL HIP ARTHROPLASTY ANTERIOR APPROACH (Right) Advance diet Up with therapy Discharge home with home health    Fariha Goto R 10/29/2012, 2:19 PM

## 2012-10-29 NOTE — Progress Notes (Signed)
Physical Therapy Treatment Patient Details Name: David Wright MRN: [id number] DOB: [birth year] Today's Date: 10/29/2012 Time: 0920-0949 PT Time Calculation (min): 29 min  PT Assessment / Plan / Recommendation Comments on Treatment Session  Pt progressing well and hoping to d/c home with f/u HHPT.  Significant porgress today with mobility.    Follow Up Recommendations  Home health PT     Does the patient have the potential to tolerate intense rehabilitation     Barriers to Discharge        Equipment Recommendations  None recommended by PT;None recommended by OT    Recommendations for Other Services    Frequency 7X/week   Plan Discharge plan remains appropriate;Frequency remains appropriate    Precautions / Restrictions Precautions Precautions: Anterior Hip Restrictions Weight Bearing Restrictions: Yes RLE Weight Bearing: Weight bearing as tolerated   Pertinent Vitals/Pain 4/10 in R hip.  Ice applied after treatment.    Mobility  Bed Mobility Bed Mobility: Not assessed Transfers Transfers: Sit to Stand;Stand to Sit Sit to Stand: 6: Modified independent (Device/Increase time);With upper extremity assist Stand to Sit: 6: Modified independent (Device/Increase time);With upper extremity assist Ambulation/Gait Ambulation/Gait Assistance: 6: Modified independent (Device/Increase time) Ambulation Distance (Feet): 400 Feet Assistive device: Rolling walker Gait Pattern: Step-to pattern;Decreased stance time - right;Antalgic General Gait Details: Gait pattern improved with increased distance Stairs: Yes Stairs Assistance: 4: Min assist Stairs Assistance Details (indicate cue type and reason): L HHA Stair Management Technique: One rail Right;Forwards Number of Stairs: 2  Wheelchair Mobility Wheelchair Mobility: No    Exercises Total Joint Exercises Ankle Circles/Pumps: AROM;Both;10 reps;Supine Quad Sets: AROM;Both;10 reps;Supine Short Arc Quad: AROM;Right;10  reps;Supine Heel Slides: AAROM;Right;10 reps;Supine Long Arc Quad: AROM;Right;10 reps;Supine       PT Goals Acute Rehab PT Goals PT Goal: Sit to Stand - Progress: Met PT Goal: Stand to Sit - Progress: Met PT Goal: Ambulate - Progress: Met PT Goal: Perform Home Exercise Program - Progress: Met  Visit Information  Last PT Received On: 10/29/12 Assistance Needed: +1    Subjective Data  Subjective: "It feels a lot better today."   Cognition  Overall Cognitive Status: Appears within functional limits for tasks assessed/performed Arousal/Alertness: Awake/alert Orientation Level: Appears intact for tasks assessed Behavior During Session: [facility] for tasks performed        End of Session PT - End of Session Activity Tolerance: Patient tolerated treatment well Patient left: in chair;with call bell/phone within reach Nurse Communication: Mobility status    Newell Coral 10/29/2012, 9:59 AM  Newell Coral, PTA Acute Rehab [phone] (office)

## 2012-10-29 NOTE — Discharge Summary (Signed)
Patient ID: David Wright MRN: [id number] DOB/AGE: 68-13-1947 68 y.o.  Admit date: 10/27/2012 Discharge date: 10/29/2012  Admission Diagnoses:  Principal Problem:  *DJD (degenerative joint disease) of hip Active Problems:  Acute blood loss anemia   Discharge Diagnoses:  Same  Past Medical History  Diagnosis Date  . Anginal pain     has been worked-up but all neg.  Marland Kitchen Dysrhythmia     atril fib  . Asthma   . GERD (gastroesophageal reflux disease)   . Arthritis     Surgeries: Procedure(s): TOTAL HIP ARTHROPLASTY ANTERIOR APPROACH on 10/27/2012   Consultants:    Discharged Condition: Improved  Hospital Course: David Wright is an 66 y.o. male who was admitted 10/27/2012 for operative treatment ofDJD (degenerative joint disease) of hip. Patient has severe unremitting pain that affects sleep, daily activities, and work/hobbies. After pre-op clearance the patient was taken to the operating room on 10/27/2012 and underwent  Procedure(s): TOTAL HIP ARTHROPLASTY ANTERIOR APPROACH.    Patient was given perioperative antibiotics: Anti-infectives     Start     Dose/Rate Route Frequency Ordered Stop   10/27/12 1800   ceFAZolin (ANCEF) IVPB 2 g/50 mL premix        2 g 100 mL/hr over 30 Minutes Intravenous Every 6 hours 10/27/12 1722 25-Nov-2012 0114   10/26/12 1439   ceFAZolin (ANCEF) IVPB 2 g/50 mL premix        2 g 100 mL/hr over 30 Minutes Intravenous 60 min pre-op 10/26/12 1439 10/27/12 1110           Patient was given sequential compression devices, early ambulation, and chemoprophylaxis to prevent DVT.  Patient benefited maximally from hospital stay and there were no complications.    Recent vital signs: Patient Vitals for the past 24 hrs:  BP Temp Pulse Resp SpO2 Height Weight  10/29/12 1300 104/60 mmHg 99.4 F (37.4 C) 73  20  99 % - -  10/29/12 0648 115/52 mmHg 99 F (37.2 C) 82  20  98 % - -  2012/11/25 2337 104/59 mmHg 98.2 F (36.8 C) 84  20  97 % - -  11/25/2012 2140  - - - - - 5\' 9"  (1.753 m) 73.936 kg (163 lb)     Recent laboratory studies:  Basename 10/29/12 0520 11-25-2012 0628  WBC 9.1 6.7  HGB 10.3* 10.6*  HCT 30.3* 31.6*  PLT 141* 153  NA -- 137  K -- 3.4*  CL -- 103  CO2 -- 26  BUN -- 11  CREATININE -- 0.86  GLUCOSE -- 123*  INR -- --  CALCIUM -- 8.1*     Discharge Medications:     Medication List     As of 10/29/2012  2:23 PM    STOP taking these medications         nabumetone 500 MG tablet   Commonly known as: RELAFEN      TAKE these medications         aspirin 325 MG EC tablet   Take 1 tablet (325 mg total) by mouth 2 (two) times daily at 10 AM and 5 PM.      flecainide 50 MG tablet   Commonly known as: TAMBOCOR   Take 50 mg by mouth 2 (two) times daily.      fluticasone 50 MCG/ACT nasal spray   Commonly known as: FLONASE   Place 2 sprays into the nose daily.      HYDROcodone-acetaminophen 5-325 MG per tablet   Commonly  known as: NORCO/VICODIN   Take 1-2 tablets by mouth every 4 (four) hours as needed (breakthrough pain).      methocarbamol 500 MG tablet   Commonly known as: ROBAXIN   Take 1 tablet (500 mg total) by mouth every 6 (six) hours as needed.      metoprolol tartrate 25 MG tablet   Commonly known as: LOPRESSOR   Take 12.5 mg by mouth 2 (two) times daily.        Diagnostic Studies: Dg Chest 2 View  10/20/2012  *RADIOLOGY REPORT*  Clinical Data: Preoperative chest x-ray prior to right total hip arthroplasty  CHEST - 2 VIEW  Comparison: Prior chest x-ray 08/19/2011  Findings: The lungs are well-aerated and free from pulmonary edema, focal airspace consolidation or pulmonary nodule.  Cardiac and mediastinal contours are within normal limits.  No pneumothorax, or pleural effusion. No acute osseous findings.  IMPRESSION:  No acute cardiopulmonary disease.   Original Report Authenticated By: Malachy Moan, M.D.    Dg Hip Operative Right  10/27/2012  *RADIOLOGY REPORT*  Clinical Data: Anterior right  hip replacement.  DG OPERATIVE RIGHT HIP  Comparison: None.  Findings: Spot fluoroscopic images of the right hip and lower pelvis demonstrate right total hip arthroplasty.  The hardware appears well positioned.  There is no evidence of acute fracture or dislocation.  IMPRESSION: No demonstrated complication following right total hip arthroplasty.   Original Report Authenticated By: Carey Bullocks, M.D.     Disposition: 01-Home or Self Care      Discharge Orders    Future Orders Please Complete By Expires   Diet - low sodium heart healthy      Call MD / Call 911      Comments:   If you experience chest pain or shortness of breath, CALL 911 and be transported to the hospital emergency room.  If you develope a fever above 101 F, pus (white drainage) or increased drainage or redness at the wound, or calf pain, call your surgeon's office.   Constipation Prevention      Comments:   Drink plenty of fluids.  Prune juice may be helpful.  You may use a stool softener, such as Colace (over the counter) 100 mg twice a day.  Use MiraLax (over the counter) for constipation as needed.   Increase activity slowly as tolerated         Follow-up Information    Follow up with Velna Ochs, MD. Call in 2 weeks.   Contact information:   [address] [place] [address] [phone]           Signed: Prince Rome 10/29/2012, 2:23 PM

## 2012-10-30 DIAGNOSIS — Z471 Aftercare following joint replacement surgery: Secondary | ICD-10-CM | POA: Diagnosis not present

## 2012-10-30 DIAGNOSIS — I10 Essential (primary) hypertension: Secondary | ICD-10-CM | POA: Diagnosis not present

## 2012-10-30 DIAGNOSIS — IMO0001 Reserved for inherently not codable concepts without codable children: Secondary | ICD-10-CM | POA: Diagnosis not present

## 2012-10-30 DIAGNOSIS — Z4801 Encounter for change or removal of surgical wound dressing: Secondary | ICD-10-CM | POA: Diagnosis not present

## 2012-10-30 DIAGNOSIS — Z96649 Presence of unspecified artificial hip joint: Secondary | ICD-10-CM | POA: Diagnosis not present

## 2012-11-03 DIAGNOSIS — I10 Essential (primary) hypertension: Secondary | ICD-10-CM | POA: Diagnosis not present

## 2012-11-03 DIAGNOSIS — Z4801 Encounter for change or removal of surgical wound dressing: Secondary | ICD-10-CM | POA: Diagnosis not present

## 2012-11-03 DIAGNOSIS — IMO0001 Reserved for inherently not codable concepts without codable children: Secondary | ICD-10-CM | POA: Diagnosis not present

## 2012-11-03 DIAGNOSIS — Z471 Aftercare following joint replacement surgery: Secondary | ICD-10-CM | POA: Diagnosis not present

## 2012-11-03 DIAGNOSIS — Z96649 Presence of unspecified artificial hip joint: Secondary | ICD-10-CM | POA: Diagnosis not present

## 2012-11-05 DIAGNOSIS — Z96649 Presence of unspecified artificial hip joint: Secondary | ICD-10-CM | POA: Diagnosis not present

## 2012-11-05 DIAGNOSIS — IMO0001 Reserved for inherently not codable concepts without codable children: Secondary | ICD-10-CM | POA: Diagnosis not present

## 2012-11-05 DIAGNOSIS — Z471 Aftercare following joint replacement surgery: Secondary | ICD-10-CM | POA: Diagnosis not present

## 2012-11-05 DIAGNOSIS — Z4801 Encounter for change or removal of surgical wound dressing: Secondary | ICD-10-CM | POA: Diagnosis not present

## 2012-11-05 DIAGNOSIS — I10 Essential (primary) hypertension: Secondary | ICD-10-CM | POA: Diagnosis not present

## 2012-11-09 DIAGNOSIS — M1610 Unilateral primary osteoarthritis, unspecified hip: Secondary | ICD-10-CM | POA: Diagnosis not present

## 2012-11-10 DIAGNOSIS — Z4801 Encounter for change or removal of surgical wound dressing: Secondary | ICD-10-CM | POA: Diagnosis not present

## 2012-11-10 DIAGNOSIS — I10 Essential (primary) hypertension: Secondary | ICD-10-CM | POA: Diagnosis not present

## 2012-11-10 DIAGNOSIS — Z96649 Presence of unspecified artificial hip joint: Secondary | ICD-10-CM | POA: Diagnosis not present

## 2012-11-10 DIAGNOSIS — Z471 Aftercare following joint replacement surgery: Secondary | ICD-10-CM | POA: Diagnosis not present

## 2012-11-10 DIAGNOSIS — IMO0001 Reserved for inherently not codable concepts without codable children: Secondary | ICD-10-CM | POA: Diagnosis not present

## 2012-11-12 DIAGNOSIS — Z96649 Presence of unspecified artificial hip joint: Secondary | ICD-10-CM | POA: Diagnosis not present

## 2012-11-12 DIAGNOSIS — Z4801 Encounter for change or removal of surgical wound dressing: Secondary | ICD-10-CM | POA: Diagnosis not present

## 2012-11-12 DIAGNOSIS — IMO0001 Reserved for inherently not codable concepts without codable children: Secondary | ICD-10-CM | POA: Diagnosis not present

## 2012-11-12 DIAGNOSIS — I10 Essential (primary) hypertension: Secondary | ICD-10-CM | POA: Diagnosis not present

## 2012-11-12 DIAGNOSIS — Z471 Aftercare following joint replacement surgery: Secondary | ICD-10-CM | POA: Diagnosis not present

## 2012-11-16 DIAGNOSIS — Z471 Aftercare following joint replacement surgery: Secondary | ICD-10-CM | POA: Diagnosis not present

## 2012-11-16 DIAGNOSIS — Z96649 Presence of unspecified artificial hip joint: Secondary | ICD-10-CM | POA: Diagnosis not present

## 2012-11-16 DIAGNOSIS — I10 Essential (primary) hypertension: Secondary | ICD-10-CM | POA: Diagnosis not present

## 2012-11-16 DIAGNOSIS — Z4801 Encounter for change or removal of surgical wound dressing: Secondary | ICD-10-CM | POA: Diagnosis not present

## 2012-11-16 DIAGNOSIS — IMO0001 Reserved for inherently not codable concepts without codable children: Secondary | ICD-10-CM | POA: Diagnosis not present

## 2012-11-24 DIAGNOSIS — M25559 Pain in unspecified hip: Secondary | ICD-10-CM | POA: Diagnosis not present

## 2012-11-24 DIAGNOSIS — Z96649 Presence of unspecified artificial hip joint: Secondary | ICD-10-CM | POA: Diagnosis not present

## 2012-11-26 DIAGNOSIS — M25559 Pain in unspecified hip: Secondary | ICD-10-CM | POA: Diagnosis not present

## 2012-11-26 DIAGNOSIS — Z96649 Presence of unspecified artificial hip joint: Secondary | ICD-10-CM | POA: Diagnosis not present

## 2012-11-30 DIAGNOSIS — M1610 Unilateral primary osteoarthritis, unspecified hip: Secondary | ICD-10-CM | POA: Diagnosis not present

## 2012-12-01 DIAGNOSIS — M25559 Pain in unspecified hip: Secondary | ICD-10-CM | POA: Diagnosis not present

## 2012-12-01 DIAGNOSIS — Z96649 Presence of unspecified artificial hip joint: Secondary | ICD-10-CM | POA: Diagnosis not present

## 2012-12-03 DIAGNOSIS — M25559 Pain in unspecified hip: Secondary | ICD-10-CM | POA: Diagnosis not present

## 2012-12-03 DIAGNOSIS — Z96649 Presence of unspecified artificial hip joint: Secondary | ICD-10-CM | POA: Diagnosis not present

## 2012-12-07 DIAGNOSIS — Z96649 Presence of unspecified artificial hip joint: Secondary | ICD-10-CM | POA: Diagnosis not present

## 2012-12-07 DIAGNOSIS — M25559 Pain in unspecified hip: Secondary | ICD-10-CM | POA: Diagnosis not present

## 2012-12-08 DIAGNOSIS — M25539 Pain in unspecified wrist: Secondary | ICD-10-CM | POA: Diagnosis not present

## 2012-12-10 DIAGNOSIS — Z96649 Presence of unspecified artificial hip joint: Secondary | ICD-10-CM | POA: Diagnosis not present

## 2012-12-10 DIAGNOSIS — M25559 Pain in unspecified hip: Secondary | ICD-10-CM | POA: Diagnosis not present

## 2012-12-14 DIAGNOSIS — Z96649 Presence of unspecified artificial hip joint: Secondary | ICD-10-CM | POA: Diagnosis not present

## 2012-12-14 DIAGNOSIS — M25559 Pain in unspecified hip: Secondary | ICD-10-CM | POA: Diagnosis not present

## 2012-12-17 DIAGNOSIS — Z96649 Presence of unspecified artificial hip joint: Secondary | ICD-10-CM | POA: Diagnosis not present

## 2012-12-17 DIAGNOSIS — M25559 Pain in unspecified hip: Secondary | ICD-10-CM | POA: Diagnosis not present

## 2012-12-18 DIAGNOSIS — M25539 Pain in unspecified wrist: Secondary | ICD-10-CM | POA: Diagnosis not present

## 2012-12-21 DIAGNOSIS — M25559 Pain in unspecified hip: Secondary | ICD-10-CM | POA: Diagnosis not present

## 2012-12-21 DIAGNOSIS — Z96649 Presence of unspecified artificial hip joint: Secondary | ICD-10-CM | POA: Diagnosis not present

## 2012-12-24 DIAGNOSIS — M25559 Pain in unspecified hip: Secondary | ICD-10-CM | POA: Diagnosis not present

## 2012-12-24 DIAGNOSIS — Z96649 Presence of unspecified artificial hip joint: Secondary | ICD-10-CM | POA: Diagnosis not present

## 2013-04-02 DIAGNOSIS — I4891 Unspecified atrial fibrillation: Secondary | ICD-10-CM | POA: Diagnosis not present

## 2013-04-02 DIAGNOSIS — I491 Atrial premature depolarization: Secondary | ICD-10-CM | POA: Diagnosis not present

## 2013-09-06 DIAGNOSIS — M25549 Pain in joints of unspecified hand: Secondary | ICD-10-CM | POA: Diagnosis not present

## 2013-09-06 DIAGNOSIS — M25559 Pain in unspecified hip: Secondary | ICD-10-CM | POA: Diagnosis not present

## 2013-09-06 DIAGNOSIS — M545 Low back pain: Secondary | ICD-10-CM | POA: Diagnosis not present

## 2013-09-07 ENCOUNTER — Other Ambulatory Visit (HOSPITAL_COMMUNITY): Payer: Self-pay | Admitting: Orthopaedic Surgery

## 2013-09-07 DIAGNOSIS — M25551 Pain in right hip: Secondary | ICD-10-CM

## 2013-09-08 ENCOUNTER — Encounter (HOSPITAL_COMMUNITY): Payer: Medicare Other

## 2013-09-27 ENCOUNTER — Encounter (HOSPITAL_COMMUNITY)
Admission: RE | Admit: 2013-09-27 | Discharge: 2013-09-27 | Disposition: A | Discharge status: Home / Self Care | Payer: Medicare Other | Source: Ambulatory Visit | Attending: Orthopaedic Surgery | Admitting: Orthopaedic Surgery

## 2013-09-27 DIAGNOSIS — M25559 Pain in unspecified hip: Secondary | ICD-10-CM | POA: Insufficient documentation

## 2013-09-27 DIAGNOSIS — M25551 Pain in right hip: Secondary | ICD-10-CM

## 2013-09-27 DIAGNOSIS — Z96649 Presence of unspecified artificial hip joint: Secondary | ICD-10-CM | POA: Diagnosis not present

## 2013-09-27 MED ORDER — TECHNETIUM TC 99M MEDRONATE IV KIT
23.4000 | PACK | Freq: Once | INTRAVENOUS | Status: AC | PRN
  Administered 2013-09-27: 23.4 via INTRAVENOUS

## 2013-10-01 DIAGNOSIS — J3089 Other allergic rhinitis: Secondary | ICD-10-CM | POA: Diagnosis not present

## 2013-10-01 DIAGNOSIS — J301 Allergic rhinitis due to pollen: Secondary | ICD-10-CM | POA: Diagnosis not present

## 2013-10-01 DIAGNOSIS — J45909 Unspecified asthma, uncomplicated: Secondary | ICD-10-CM | POA: Diagnosis not present

## 2013-10-06 ENCOUNTER — Encounter: Payer: Self-pay | Admitting: *Deleted

## 2013-10-06 ENCOUNTER — Encounter: Payer: Self-pay | Admitting: Cardiology

## 2013-10-06 DIAGNOSIS — I491 Atrial premature depolarization: Secondary | ICD-10-CM | POA: Insufficient documentation

## 2013-10-06 DIAGNOSIS — E7800 Pure hypercholesterolemia, unspecified: Secondary | ICD-10-CM | POA: Insufficient documentation

## 2013-10-06 DIAGNOSIS — J45909 Unspecified asthma, uncomplicated: Secondary | ICD-10-CM | POA: Insufficient documentation

## 2013-10-07 ENCOUNTER — Ambulatory Visit (INDEPENDENT_AMBULATORY_CARE_PROVIDER_SITE_OTHER): Payer: Medicare Other | Admitting: Cardiology

## 2013-10-07 ENCOUNTER — Encounter: Payer: Self-pay | Admitting: Cardiology

## 2013-10-07 VITALS — BP 106/68 | HR 57 | Ht 69.0 in | Wt 163.0 lb

## 2013-10-07 DIAGNOSIS — I4891 Unspecified atrial fibrillation: Secondary | ICD-10-CM | POA: Diagnosis not present

## 2013-10-07 DIAGNOSIS — I4719 Other supraventricular tachycardia: Secondary | ICD-10-CM

## 2013-10-07 DIAGNOSIS — I471 Supraventricular tachycardia: Secondary | ICD-10-CM | POA: Diagnosis not present

## 2013-10-07 DIAGNOSIS — I491 Atrial premature depolarization: Secondary | ICD-10-CM | POA: Diagnosis not present

## 2013-10-07 DIAGNOSIS — I480 Paroxysmal atrial fibrillation: Secondary | ICD-10-CM | POA: Insufficient documentation

## 2013-10-07 MED ORDER — ASPIRIN EC 81 MG PO TBEC: 81.0000 mg | DELAYED_RELEASE_TABLET | Freq: Two times a day (BID) | ORAL | Status: DC

## 2013-10-07 NOTE — Progress Notes (Signed)
  [address] [place], [place]  [address] Phone: [phone] Fax:  [phone]  Date:  10/07/2013   ID:  Sylvia Kondracki, DOB [birth year], MRN [id number]  PCP:  Darnelle Bos, MD  Cardiologist:  Armanda Magic, MD   CC:  6 month followup of afib   History of Present Illness: David Wright is a 67 y.o. male with a history of atrial fibrillation, atrial tachycardia and PAC's who presents today for followup.  He is doing well.  He denies any chest pain, SOB, DOE, LE edema, dizziness, palpitations or syncope.    Wt Readings from Last 3 Encounters:  10/07/13 163 lb (73.936 kg)  10/28/12 163 lb (73.936 kg)  10/28/12 163 lb (73.936 kg)     Past Medical History  Diagnosis Date  . Anginal pain     has been worked-up but all neg.  . Asthma   . Hypercholesteremia   . Arthritis   . Paroxysmal atrial fibrillation     atril fib  . PAC (premature atrial contraction)   . Atrial tachycardia, paroxysmal     Current Outpatient Prescriptions  Medication Sig Dispense Refill  . aspirin 325 MG EC tablet Take 1 tablet (325 mg total) by mouth 2 (two) times daily at 10 AM and 5 PM.  60 tablet  1  . flecainide (TAMBOCOR) 50 MG tablet Take 50 mg by mouth 2 (two) times daily.      . fluticasone (FLONASE) 50 MCG/ACT nasal spray Place 2 sprays into the nose daily.      Marland Kitchen HYDROcodone-acetaminophen (NORCO/VICODIN) 5-325 MG per tablet Take 1-2 tablets by mouth every 4 (four) hours as needed (breakthrough pain).  80 tablet  0  . methocarbamol (ROBAXIN) 500 MG tablet Take 1 tablet (500 mg total) by mouth every 6 (six) hours as needed.  40 tablet  1  . metoprolol tartrate (LOPRESSOR) 25 MG tablet Take 12.5 mg by mouth 2 (two) times daily.       No current facility-administered medications for this visit.    Allergies:   No Known Allergies  Social History:  The patient  reports that he has never smoked. He has never used smokeless tobacco. He reports that he drinks alcohol. He reports that  he does not use illicit drugs.   Family History:  The patient's family history includes Heart attack in his father and mother; Heart disease in his father and mother; Pancreatic cancer in his sister.   ROS:  Please see the history of present illness.      All other systems reviewed and negative except for some bruising on his arms when he hits them against something.  PHYSICAL EXAM: VS:  Ht 5\' 9"  (1.753 m)  Wt 163 lb (73.936 kg)  BMI 24.06 kg/m2 Well nourished, well developed, in no acute distress HEENT: normal Neck: no JVD Cardiac:  normal S1, S2; RRR; no murmur Lungs:  clear to auscultation bilaterally, no wheezing, rhonchi or rales Abd: soft, nontender, no hepatomegaly Ext: no edema Skin: warm and dry Neuro:  CNs 2-12 intact, no focal abnormalities noted  EKG:  Sinus bradycardia     ASSESSMENT AND PLAN:  1. Paroxysmal atrial fibrillation with no reoccurence  - continue ASA/flecainide/metoprolol  - decrease ASA to 81mg  daily since he has had some bruising 2. PAC's 3. Nonsustained atrial tachycardia with no reoccurence  Followup with me in 6 months  Signed, Armanda Magic, MD 10/07/2013 10:15 AM

## 2013-10-07 NOTE — Patient Instructions (Signed)
Your physician has recommended you make the following change in your medication: Decrease Aspirin to 81 MG daily  Your physician wants you to follow-up in: 6 Months with Dr. Sherlyn Lick will receive a reminder letter in the mail two months in advance. If you don't receive a letter, please call our office to schedule the follow-up appointment.

## 2013-10-26 DIAGNOSIS — L570 Actinic keratosis: Secondary | ICD-10-CM | POA: Diagnosis not present

## 2013-12-12 IMAGING — CR DG CHEST 2V
2 series · 2 of 2 positions shown · non-contrast
Comparison: Prior chest x-ray 08/19/2011

CLINICAL DATA: Preoperative chest x-ray prior to right total hip
arthroplasty

CHEST - 2 VIEW

[view not recorded (1 of 2)]
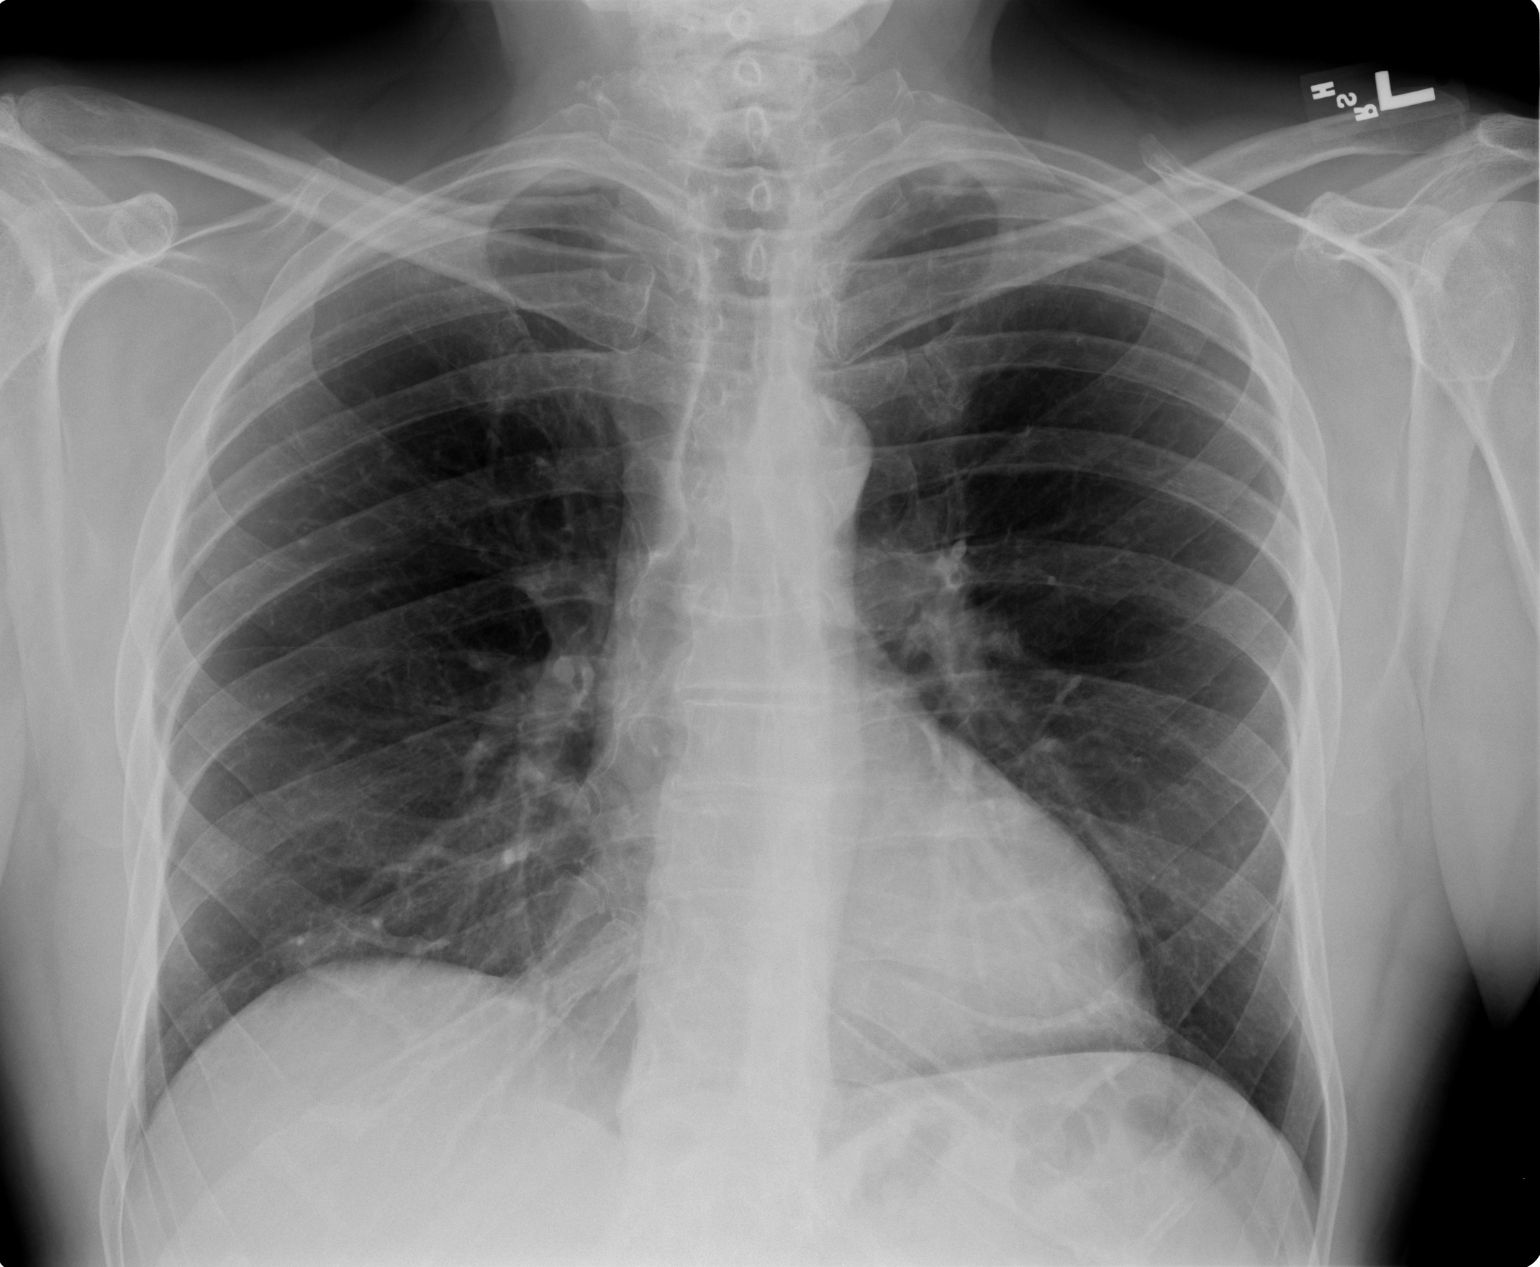

[view not recorded (2 of 2)]
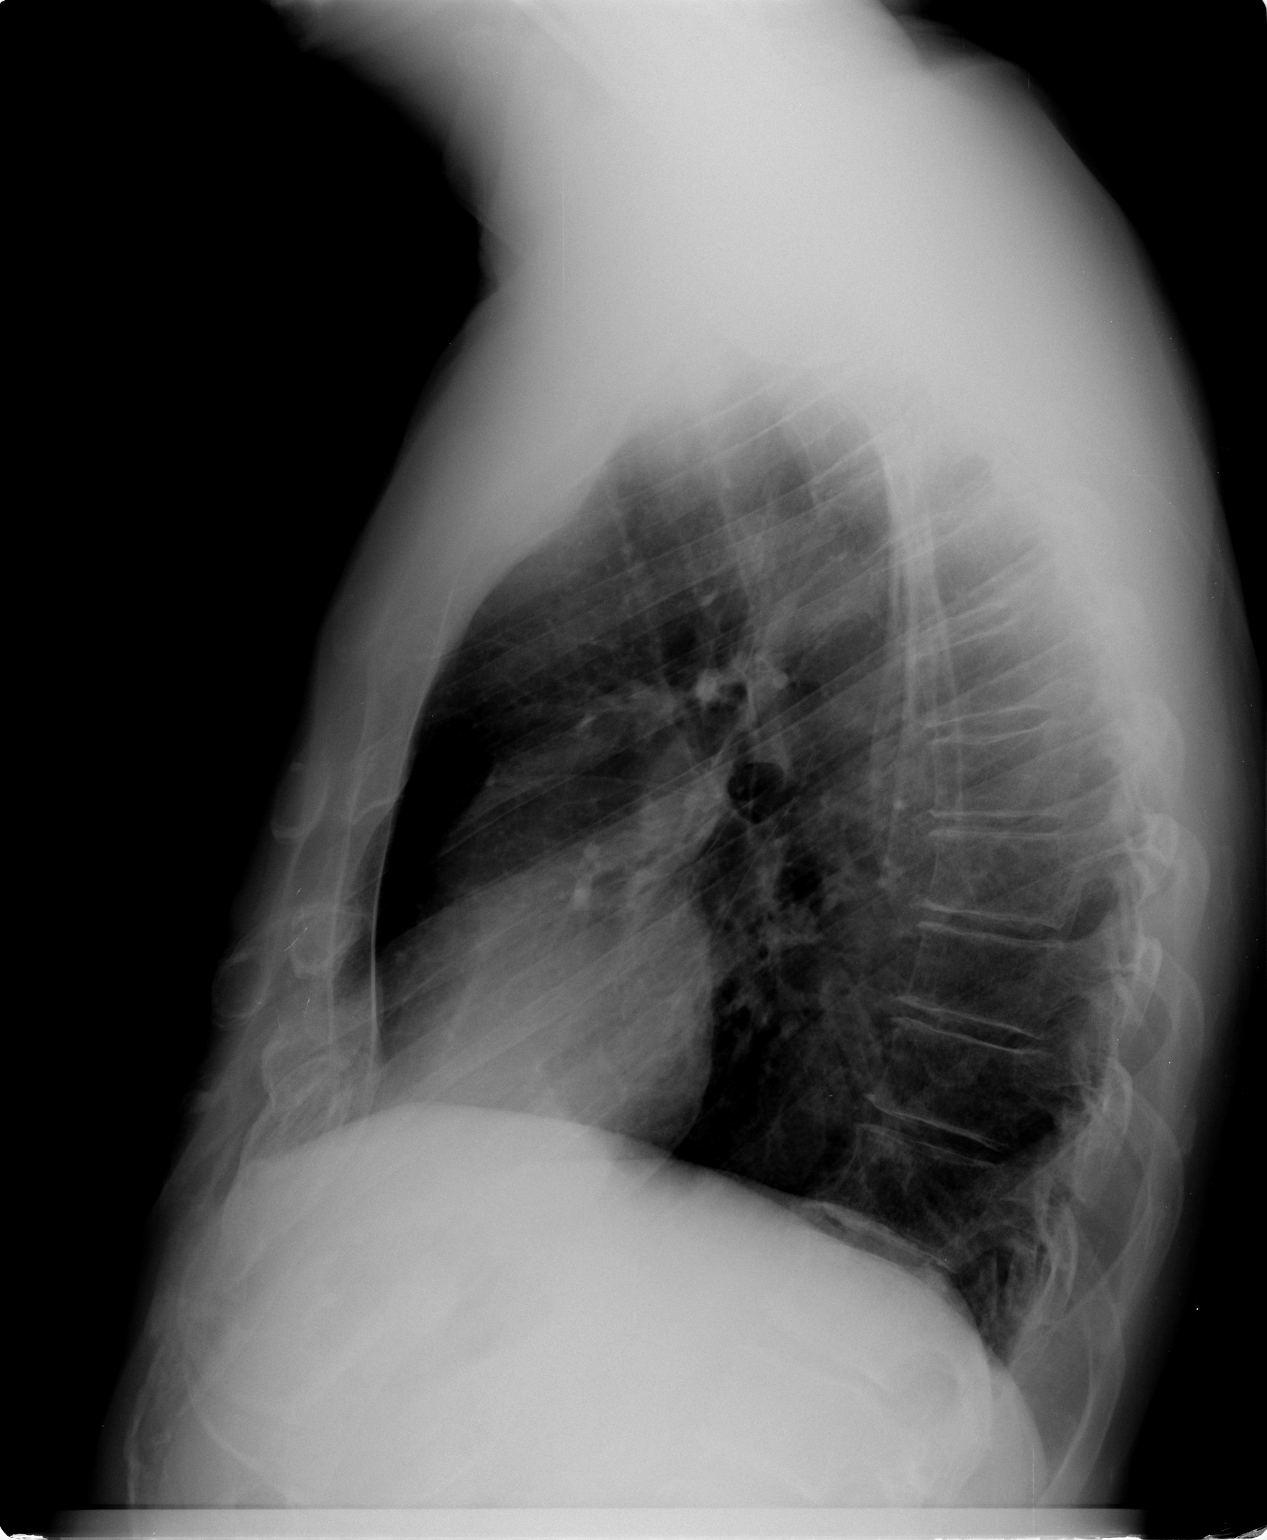

[2 of 2 positions shown; findings below may reference images not displayed]

FINDINGS: [The lungs are well-aerated and free from pulmonary
edema, focal airspace consolidation or pulmonary nodule.  Cardiac
and mediastinal contours are within normal limits.  No
pneumothorax, or pleural effusion. No acute osseous findings.]
IMPRESSION: No acute cardiopulmonary disease.

## 2013-12-19 IMAGING — RF DG HIP OPERATIVE*R*
1 series · 2 of 2 positions shown · non-contrast
Comparison: None.

CLINICAL DATA: Anterior right hip replacement.

DG OPERATIVE RIGHT HIP

[Series 1: run · 2 of 2 slices shown]
[im 1/2]
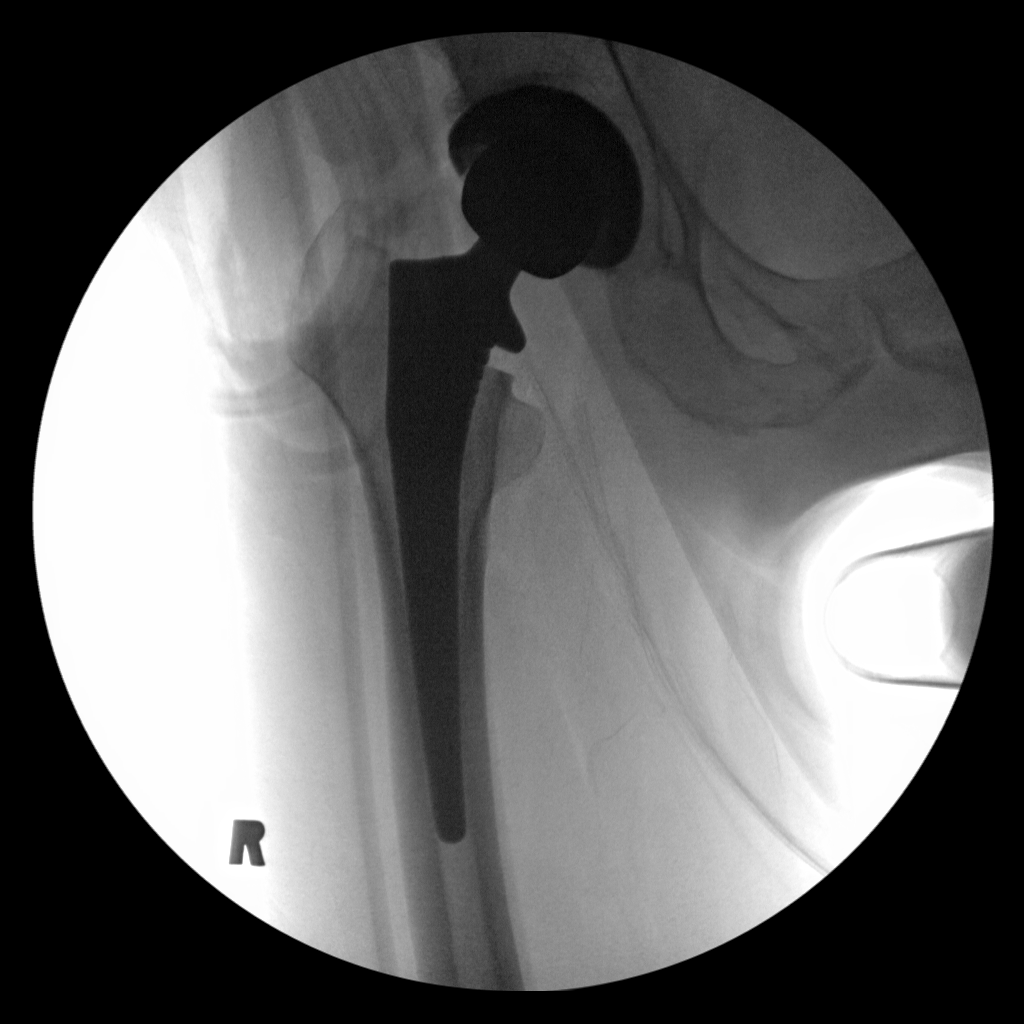
[im 2/2]
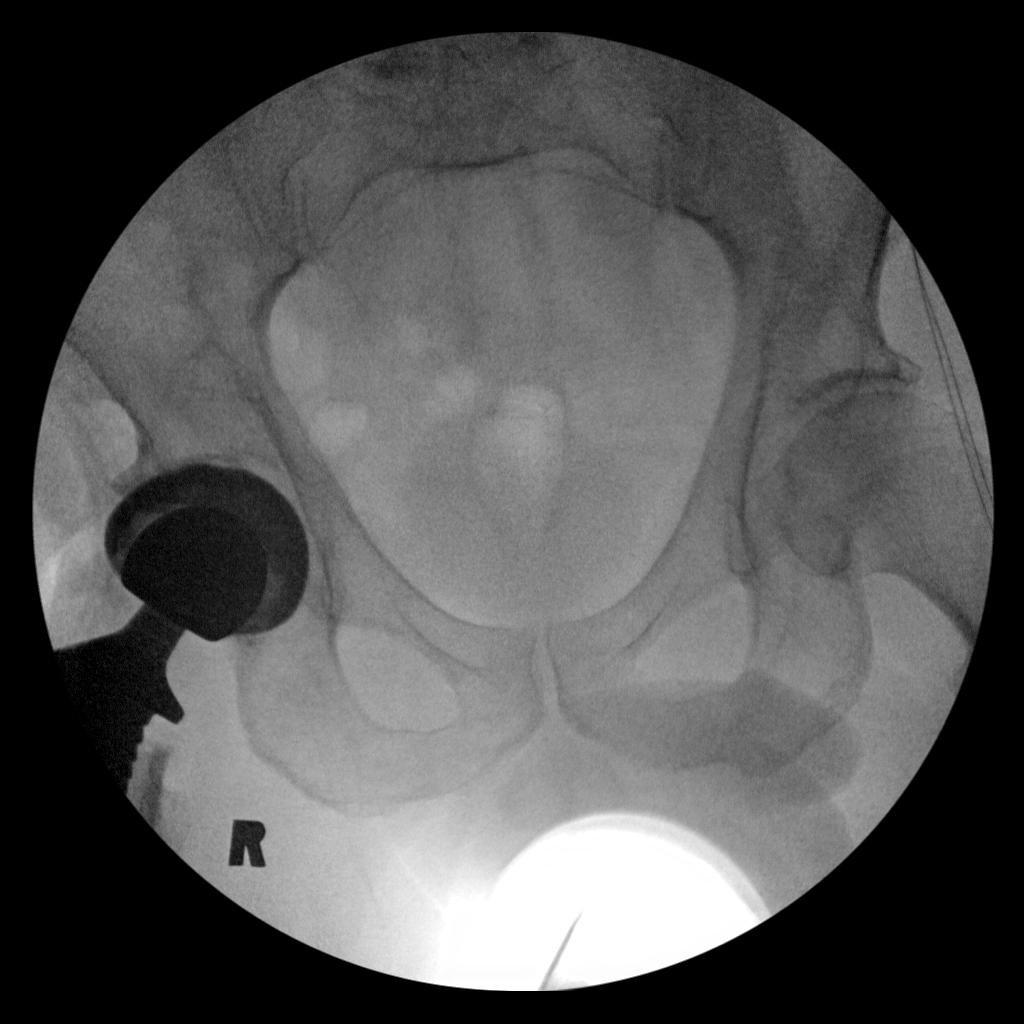

[2 of 2 positions shown; findings below may reference images not displayed]

FINDINGS: Spot fluoroscopic images of the right hip and lower
pelvis demonstrate right total hip arthroplasty.  The hardware
appears well positioned.  There is no evidence of acute fracture or
dislocation.
IMPRESSION: No demonstrated complication following right total hip
arthroplasty.

## 2013-12-23 DIAGNOSIS — D239 Other benign neoplasm of skin, unspecified: Secondary | ICD-10-CM | POA: Diagnosis not present

## 2013-12-23 DIAGNOSIS — L821 Other seborrheic keratosis: Secondary | ICD-10-CM | POA: Diagnosis not present

## 2013-12-23 DIAGNOSIS — H2510 Age-related nuclear cataract, unspecified eye: Secondary | ICD-10-CM | POA: Diagnosis not present

## 2013-12-23 DIAGNOSIS — L570 Actinic keratosis: Secondary | ICD-10-CM | POA: Diagnosis not present

## 2013-12-23 DIAGNOSIS — H35039 Hypertensive retinopathy, unspecified eye: Secondary | ICD-10-CM | POA: Diagnosis not present

## 2013-12-23 DIAGNOSIS — D1801 Hemangioma of skin and subcutaneous tissue: Secondary | ICD-10-CM | POA: Diagnosis not present

## 2013-12-23 DIAGNOSIS — H43399 Other vitreous opacities, unspecified eye: Secondary | ICD-10-CM | POA: Diagnosis not present

## 2013-12-23 DIAGNOSIS — L819 Disorder of pigmentation, unspecified: Secondary | ICD-10-CM | POA: Diagnosis not present

## 2014-03-16 ENCOUNTER — Telehealth: Payer: Self-pay | Admitting: Cardiology

## 2014-03-16 NOTE — Telephone Encounter (Signed)
   Pt called sating he felt like he was back in atrial fibrillation. He notes palpations but denies CP, SOB, syncope/ near syncope. He reports daily compliance with Flecanide and Lopressor. He has been checking his BP and HR at home. His BP has been stable in the 183U systolic. HR has been fluctuating from 90-110. Since his BP is stable, I instructed him to take an extra dose of Lopressor. He was instructed to seek urgent medical evaluation if he became unstable overnight. He verbalized understanding. If he remains stable, he is to call the office in the am to try to arrange an appt with Dr. Radford Pax or an APP for evaluation, as he may need medication adjustments.   Lyda Jester, PA-C

## 2014-03-17 ENCOUNTER — Ambulatory Visit (INDEPENDENT_AMBULATORY_CARE_PROVIDER_SITE_OTHER): Payer: Medicare Other | Admitting: Cardiology

## 2014-03-17 ENCOUNTER — Telehealth: Payer: Self-pay | Admitting: Cardiology

## 2014-03-17 ENCOUNTER — Encounter: Payer: Self-pay | Admitting: Cardiology

## 2014-03-17 VITALS — BP 111/70 | HR 54 | Ht 69.0 in | Wt 160.8 lb

## 2014-03-17 DIAGNOSIS — I4891 Unspecified atrial fibrillation: Secondary | ICD-10-CM

## 2014-03-17 DIAGNOSIS — I471 Supraventricular tachycardia: Secondary | ICD-10-CM

## 2014-03-17 DIAGNOSIS — I480 Paroxysmal atrial fibrillation: Secondary | ICD-10-CM

## 2014-03-17 DIAGNOSIS — I491 Atrial premature depolarization: Secondary | ICD-10-CM | POA: Diagnosis not present

## 2014-03-17 MED ORDER — FLECAINIDE ACETATE 50 MG PO TABS: 50.0000 mg | ORAL_TABLET | Freq: Two times a day (BID) | ORAL | Status: DC

## 2014-03-17 MED ORDER — METOPROLOL TARTRATE 25 MG PO TABS: 12.5000 mg | ORAL_TABLET | Freq: Two times a day (BID) | ORAL | Status: DC

## 2014-03-17 NOTE — Telephone Encounter (Signed)
New Message:  Pt is c/o having afib last night and this morning.. States he called after hours last night and spoke to the PA and she told him to take an extra dose of lopressor... Pts BP this morning 90/50 pulse is in the 50s and 60s.... Pt would like to see Dr. Radford Pax today and be caleld by the nurse.

## 2014-03-17 NOTE — Progress Notes (Signed)
[place], [place], [place]  [address] Phone: [phone] Fax:  [phone]  Date:  03/17/2014   ID:  David Wright, DOB [birth year], MRN [id number]  PCP:  Horton Finer, MD  Cardiologist:  Fransico Him, MD     History of Present Illness: David Wright is a 68 y.o. male with a history of atrial fibrillation, atrial tachycardia and PAC's who presents today for followup. He comes in today because he had palpitations last pm and thinks he was in afib.  He took an extra Toprol and comes in today for evaluation. Since I saw him last this is the first episode he has had.  It lasted about 12 hours and this am converted back to SR.  He denies any chest pain, SOB, DOE, LE edema, dizziness, or syncope  Wt Readings from Last 3 Encounters:  10/07/13 163 lb (73.936 kg)  10/28/12 163 lb (73.936 kg)  10/28/12 163 lb (73.936 kg)     Past Medical History  Diagnosis Date  . Anginal pain     has been worked-up but all neg.  . Asthma   . Hypercholesteremia   . Arthritis   . Paroxysmal atrial fibrillation     atril fib  . PAC (premature atrial contraction)   . Atrial tachycardia, paroxysmal     Current Outpatient Prescriptions  Medication Sig Dispense Refill  . aspirin 81 MG tablet Take 1 tablet (81 mg total) by mouth 2 (two) times daily at 10 AM and 5 PM.  30 tablet  1  . flecainide (TAMBOCOR) 50 MG tablet Take 50 mg by mouth 2 (two) times daily.      . fluticasone (FLONASE) 50 MCG/ACT nasal spray Place 2 sprays into the nose daily.      . methocarbamol (ROBAXIN) 500 MG tablet Take 1 tablet (500 mg total) by mouth every 6 (six) hours as needed.  40 tablet  1  . metoprolol tartrate (LOPRESSOR) 25 MG tablet Take 12.5 mg by mouth 2 (two) times daily.       No current facility-administered medications for this visit.    Allergies:   No Known Allergies  Social History:  The patient  reports that he has never smoked. He has never used smokeless tobacco. He reports that he  drinks alcohol. He reports that he does not use illicit drugs.   Family History:  The patient's family history includes Heart attack in his father and mother; Heart disease in his father and mother; Pancreatic cancer in his sister.   ROS:  Please see the history of present illness.      All other systems reviewed and negative.   PHYSICAL EXAM: VS:  There were no vitals taken for this visit. Well nourished, well developed, in no acute distress HEENT: normal Neck: no JVD Cardiac:  normal S1, S2; RRR; no murmur Lungs:  clear to auscultation bilaterally, no wheezing, rhonchi or rales Abd: soft, nontender, no hepatomegaly Ext: no edema Skin: warm and dry Neuro:  CNs 2-12 intact, no focal abnormalities noted  EKG:     Sinus bradycardia at 54bpm  ASSESSMENT AND PLAN:  1. Paroxysmal atrial fibrillation with one occurrence in 6 months.   - continue ASA/flecainide/metoprolol/ASA - Since he has only had 1 episode in the last 6 months that was less than 24 hours I have instructed him to continue current meds.  If he has another episode I will increase Flecainide to 100mg  BID 2. PAC's 3. Nonsustained atrial tachycardia with no  reoccurence  Followup with me in 6 months  Signed, Fransico Him, MD 03/17/2014 9:04 AM

## 2014-03-17 NOTE — Patient Instructions (Signed)
Your physician recommends that you continue on your current medications as directed. Please refer to the Current Medication list given to you today.  Your physician wants you to follow-up in: 6 months with Dr Turner You will receive a reminder letter in the mail two months in advance. If you don't receive a letter, please call our office to schedule the follow-up appointment.  

## 2014-03-17 NOTE — Telephone Encounter (Signed)
Pt is set up for 9:15 today with Dr Radford Pax

## 2014-04-05 ENCOUNTER — Ambulatory Visit: Payer: Medicare Other | Admitting: Cardiology

## 2014-09-13 ENCOUNTER — Ambulatory Visit: Payer: Medicare Other | Admitting: Cardiology

## 2014-10-14 DIAGNOSIS — J4530 Mild persistent asthma, uncomplicated: Secondary | ICD-10-CM | POA: Diagnosis not present

## 2014-10-14 DIAGNOSIS — J301 Allergic rhinitis due to pollen: Secondary | ICD-10-CM | POA: Diagnosis not present

## 2014-10-14 DIAGNOSIS — J3089 Other allergic rhinitis: Secondary | ICD-10-CM | POA: Diagnosis not present

## 2014-10-17 ENCOUNTER — Encounter: Payer: Self-pay | Admitting: Cardiology

## 2014-10-17 ENCOUNTER — Ambulatory Visit (INDEPENDENT_AMBULATORY_CARE_PROVIDER_SITE_OTHER): Payer: Medicare Other | Admitting: Cardiology

## 2014-10-17 VITALS — BP 136/64 | HR 50 | Ht 69.0 in | Wt 163.0 lb

## 2014-10-17 DIAGNOSIS — I480 Paroxysmal atrial fibrillation: Secondary | ICD-10-CM

## 2014-10-17 DIAGNOSIS — I491 Atrial premature depolarization: Secondary | ICD-10-CM | POA: Diagnosis not present

## 2014-10-17 DIAGNOSIS — I471 Supraventricular tachycardia: Secondary | ICD-10-CM | POA: Diagnosis not present

## 2014-10-17 DIAGNOSIS — I4719 Other supraventricular tachycardia: Secondary | ICD-10-CM

## 2014-10-17 MED ORDER — METOPROLOL TARTRATE 25 MG PO TABS: 12.5000 mg | ORAL_TABLET | Freq: Two times a day (BID) | ORAL | Status: DC

## 2014-10-17 MED ORDER — FLECAINIDE ACETATE 50 MG PO TABS: 50.0000 mg | ORAL_TABLET | Freq: Two times a day (BID) | ORAL | Status: DC

## 2014-10-17 NOTE — Progress Notes (Signed)
[place], [place] [place],  [place]  [address] Phone: [phone] Fax:  [phone]  Date:  10/17/2014   ID:  David Wright, DOB [birth year], MRN [id number]  PCP:  No primary care provider on file.  Cardiologist:  Fransico Him, MD    History of Present Illness: David Wright is a 68 y.o. male with a history of atrial fibrillation, atrial tachycardia and PAC's who presents today for followup. He says that about 2 weeks ago after getting up to go to the bathroom he noticed that he was in atrial fibrillation.  He got diaphoretic.  He took 2 metoprolol and went back to bed and in the am he was in NSR.  That is the only episodes since I saw him last.   He denies any chest pain,  LE edema or syncope.  He is only SOB if doing extreme yard work.      Wt Readings from Last 3 Encounters:  10/17/14 163 lb (73.936 kg)  03/17/14 160 lb 12.8 oz (72.938 kg)  10/07/13 163 lb (73.936 kg)     Past Medical History  Diagnosis Date  . Anginal pain     has been worked-up but all neg.  . Asthma   . Hypercholesteremia   . Arthritis   . Paroxysmal atrial fibrillation     atril fib  . PAC (premature atrial contraction)   . Atrial tachycardia, paroxysmal   . Atrial fibrillation   . Supraventricular premature beats     Current Outpatient Prescriptions  Medication Sig Dispense Refill  . aspirin 81 MG tablet Take 1 tablet (81 mg total) by mouth 2 (two) times daily at 10 AM and 5 PM. 30 tablet 1  . flecainide (TAMBOCOR) 50 MG tablet Take 1 tablet (50 mg total) by mouth 2 (two) times daily. 180 tablet 3  . fluticasone (FLONASE) 50 MCG/ACT nasal spray Place 2 sprays into the nose daily.    . Fluticasone-Salmeterol (ADVAIR DISKUS) 100-50 MCG/DOSE AEPB Inhale 1 puff into the lungs daily.    . metoprolol tartrate (LOPRESSOR) 25 MG tablet Take 0.5 tablets (12.5 mg total) by mouth 2 (two) times daily. 90 tablet 3   No current facility-administered medications for this visit.    Allergies:   No  Known Allergies  Social History:  The patient  reports that he has never smoked. He has never used smokeless tobacco. He reports that he drinks alcohol. He reports that he does not use illicit drugs.   Family History:  The patient's family history includes Heart attack in his father and mother; Heart disease in his father and mother; Pancreatic cancer in his sister.   ROS:  Please see the history of present illness.      All other systems reviewed and negative.   PHYSICAL EXAM: VS:  BP 136/64 mmHg  Pulse 50  Ht 5\' 9"  (1.753 m)  Wt 163 lb (73.936 kg)  BMI 24.06 kg/m2  SpO2 94% Well nourished, well developed, in no acute distress HEENT: normal Neck: no JVD Cardiac:  normal S1, S2; RRR; no murmur Lungs:  clear to auscultation bilaterally, no wheezing, rhonchi or rales Abd: soft, nontender, no hepatomegaly Ext: no edema Skin: warm and dry Neuro:  CNs 2-12 intact, no focal abnormalities noted  ASSESSMENT AND PLAN:  1. Paroxysmal atrial fibrillation with one occurrence in 6 months. - continue ASA/flecainide/metoprolol/ASA.  His CHADS2VASC score is 1 (age>70) - Since he has only had 1 episode in the last 6 months that was  less than 24 hours I have instructed him to continue current meds. 2. PAC's 3. Nonsustained atrial tachycardia with no reoccurence  Followup with me in 6 months    Signed, Fransico Him, MD [facility] 10/17/2014 1:22 PM

## 2014-10-17 NOTE — Patient Instructions (Signed)
Your physician recommends that you continue on your current medications as directed. Please refer to the Current Medication list given to you today.  Your physician wants you to follow-up in: 6 months with Dr Turner You will receive a reminder letter in the mail two months in advance. If you don't receive a letter, please call our office to schedule the follow-up appointment.  

## 2014-10-31 DIAGNOSIS — L218 Other seborrheic dermatitis: Secondary | ICD-10-CM | POA: Diagnosis not present

## 2014-10-31 DIAGNOSIS — L570 Actinic keratosis: Secondary | ICD-10-CM | POA: Diagnosis not present

## 2014-10-31 DIAGNOSIS — L821 Other seborrheic keratosis: Secondary | ICD-10-CM | POA: Diagnosis not present

## 2014-12-26 DIAGNOSIS — H25033 Anterior subcapsular polar age-related cataract, bilateral: Secondary | ICD-10-CM | POA: Diagnosis not present

## 2015-01-18 ENCOUNTER — Encounter: Payer: Self-pay | Admitting: Cardiology

## 2015-02-18 DIAGNOSIS — M47812 Spondylosis without myelopathy or radiculopathy, cervical region: Secondary | ICD-10-CM | POA: Diagnosis not present

## 2015-02-22 DIAGNOSIS — M542 Cervicalgia: Secondary | ICD-10-CM | POA: Diagnosis not present

## 2015-02-23 DIAGNOSIS — M542 Cervicalgia: Secondary | ICD-10-CM | POA: Diagnosis not present

## 2015-02-28 DIAGNOSIS — M542 Cervicalgia: Secondary | ICD-10-CM | POA: Diagnosis not present

## 2015-03-02 DIAGNOSIS — R42 Dizziness and giddiness: Secondary | ICD-10-CM | POA: Diagnosis not present

## 2015-03-02 DIAGNOSIS — M542 Cervicalgia: Secondary | ICD-10-CM | POA: Diagnosis not present

## 2015-03-07 DIAGNOSIS — H8111 Benign paroxysmal vertigo, right ear: Secondary | ICD-10-CM | POA: Diagnosis not present

## 2015-03-07 DIAGNOSIS — R42 Dizziness and giddiness: Secondary | ICD-10-CM | POA: Diagnosis not present

## 2015-03-07 DIAGNOSIS — M542 Cervicalgia: Secondary | ICD-10-CM | POA: Diagnosis not present

## 2015-03-09 DIAGNOSIS — R42 Dizziness and giddiness: Secondary | ICD-10-CM | POA: Diagnosis not present

## 2015-03-09 DIAGNOSIS — M542 Cervicalgia: Secondary | ICD-10-CM | POA: Diagnosis not present

## 2015-03-09 DIAGNOSIS — H8111 Benign paroxysmal vertigo, right ear: Secondary | ICD-10-CM | POA: Diagnosis not present

## 2015-03-13 DIAGNOSIS — R42 Dizziness and giddiness: Secondary | ICD-10-CM | POA: Diagnosis not present

## 2015-03-13 DIAGNOSIS — M542 Cervicalgia: Secondary | ICD-10-CM | POA: Diagnosis not present

## 2015-03-13 DIAGNOSIS — H8111 Benign paroxysmal vertigo, right ear: Secondary | ICD-10-CM | POA: Diagnosis not present

## 2015-03-17 DIAGNOSIS — R42 Dizziness and giddiness: Secondary | ICD-10-CM | POA: Diagnosis not present

## 2015-03-17 DIAGNOSIS — H8111 Benign paroxysmal vertigo, right ear: Secondary | ICD-10-CM | POA: Diagnosis not present

## 2015-03-17 DIAGNOSIS — M542 Cervicalgia: Secondary | ICD-10-CM | POA: Diagnosis not present

## 2015-03-21 DIAGNOSIS — H8111 Benign paroxysmal vertigo, right ear: Secondary | ICD-10-CM | POA: Diagnosis not present

## 2015-03-21 DIAGNOSIS — M542 Cervicalgia: Secondary | ICD-10-CM | POA: Diagnosis not present

## 2015-03-21 DIAGNOSIS — R42 Dizziness and giddiness: Secondary | ICD-10-CM | POA: Diagnosis not present

## 2015-03-27 DIAGNOSIS — M542 Cervicalgia: Secondary | ICD-10-CM | POA: Diagnosis not present

## 2015-03-30 DIAGNOSIS — H8111 Benign paroxysmal vertigo, right ear: Secondary | ICD-10-CM | POA: Diagnosis not present

## 2015-03-30 DIAGNOSIS — H9313 Tinnitus, bilateral: Secondary | ICD-10-CM | POA: Diagnosis not present

## 2015-03-30 DIAGNOSIS — H903 Sensorineural hearing loss, bilateral: Secondary | ICD-10-CM | POA: Diagnosis not present

## 2015-04-03 DIAGNOSIS — R42 Dizziness and giddiness: Secondary | ICD-10-CM | POA: Diagnosis not present

## 2015-04-03 DIAGNOSIS — M542 Cervicalgia: Secondary | ICD-10-CM | POA: Diagnosis not present

## 2015-04-03 DIAGNOSIS — H8111 Benign paroxysmal vertigo, right ear: Secondary | ICD-10-CM | POA: Diagnosis not present

## 2015-04-19 NOTE — Progress Notes (Signed)
Cardiology Office Note   Date:  04/20/2015   ID:  David Wright, DOB 05-09-1946, MRN [id number]  PCP:  No primary care provider on file.    Chief Complaint  Patient presents with  . Follow-up      History of Present Illness: David Wright is a 69 y.o. male with a history of atrial fibrillation, atrial tachycardia and PAC's who presents today for followup.  He denies any chest pain, SOB, DOE, LE edema, palpitations or syncope.    Past Medical History  Diagnosis Date  . Anginal pain     has been worked-up but all neg.  . Asthma   . Hypercholesteremia   . Arthritis   . Paroxysmal atrial fibrillation     atril fib  . PAC (premature atrial contraction)   . Atrial tachycardia, paroxysmal   . Atrial fibrillation   . Supraventricular premature beats     Past Surgical History  Procedure Laterality Date  . Perianal fistula    . Eye surgery    . Tonsillectomy    . 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 aspirin 81 MG tablet Take 1 tablet (81 mg total) by mouth 2 (two) times daily at 10 AM and 5 PM. 30 tablet 1  . flecainide (TAMBOCOR) 50 MG tablet Take 1 tablet (50 mg total) by mouth 2 (two) times daily. 180 tablet 3  . fluticasone (FLONASE) 50 MCG/ACT nasal spray Place 2 sprays into the nose daily.    . Fluticasone-Salmeterol (ADVAIR DISKUS) 100-50 MCG/DOSE AEPB Inhale 1 puff into the lungs daily.    . metoprolol tartrate (LOPRESSOR) 25 MG tablet Take 0.5 tablets (12.5 mg total) by mouth 2 (two) times daily. 90 tablet 3   No current facility-administered medications for this visit.    Allergies:   Review of patient's allergies indicates no known allergies.    Social History:  The patient  reports  that he has never smoked. He has never used smokeless tobacco. He reports that he drinks alcohol. He reports that he does not use illicit drugs.   Family History:  The patient's family history includes Heart attack in his father and mother; Heart disease in his father and mother; Pancreatic cancer in his sister.    ROS:  Please see the history of present illness.   Otherwise, review of systems are positive for none.   All other systems are reviewed and negative.    PHYSICAL EXAM: VS:  BP 110/60 mmHg  Pulse 57  Ht 5\' 9"  (1.753 m)  Wt 166 lb 9.6 oz (75.569 kg)  BMI 24.59 kg/m2 , BMI Body mass index is 24.59 kg/(m^2). GEN: Well nourished, well developed, in no acute distress HEENT: normal Neck: no JVD, carotid bruits, or masses Cardiac: RRR; no murmurs, rubs, or gallops,no edema  Respiratory:  clear to auscultation bilaterally, normal work of breathing GI: soft, nontender, nondistended, + BS MS: no deformity or atrophy Skin: warm and dry, no rash Neuro:  Strength and sensation are intact Psych: euthymic mood, full affect   EKG:  EKG was ordered today showed sinus bradycardia at 57bpm with low voltage and no ST changes    Recent Labs: No results found for requested labs  within last 365 days.    Lipid Panel    Component Value Date/Time   CHOL * 01/24/2011 1714    232        ATP III CLASSIFICATION:  <200     mg/dL   Desirable  200-239  mg/dL   Borderline High  >=240    mg/dL   High          TRIG 172* 01/24/2011 1714   HDL 58 01/24/2011 1714   CHOLHDL 4.0 01/24/2011 1714   VLDL 34 01/24/2011 1714   LDLCALC * 01/24/2011 1714    140        Total Cholesterol/HDL:CHD Risk Coronary Heart Disease Risk Table                     Men   Women  1/2 Average Risk   3.4   3.3  Average Risk       5.0   4.4  2 X Average Risk   9.6   7.1  3 X Average Risk  23.4   11.0        Use the calculated Patient Ratio above and the CHD Risk Table to determine the patient's CHD Risk.          ATP III CLASSIFICATION (LDL):  <100     mg/dL   Optimal  100-129  mg/dL   Near or Above                    Optimal  130-159  mg/dL   Borderline  160-189  mg/dL   High  >190     mg/dL   Very High      Wt Readings from Last 3 Encounters:  04/20/15 166 lb 9.6 oz (75.569 kg)  10/17/14 163 lb (73.936 kg)  03/17/14 160 lb 12.8 oz (72.938 kg)    ASSESSMENT AND PLAN:  1. Paroxysmal atrial fibrillation maintaining NSR. - continue ASA/flecainide/metoprolol/ASA. His CHADS2VASC score is 1 (age>70) 2. PAC's 3. Nonsustained atrial tachycardia with no reoccurence    Current medicines are reviewed at length with the patient today.  The patient does not have concerns regarding medicines.  The following changes have been made:  no change  Labs/ tests ordered today include: see above assessment and plan  Orders Placed This Encounter  Procedures  . EKG 12-Lead     Disposition:   FU with me in 1 year   Signed, Sueanne Margarita, MD  04/20/2015 8:35 AM    [facility] [place], [place], [place]  [address] Phone: [phone]; Fax: [phone]

## 2015-04-20 ENCOUNTER — Encounter: Payer: Self-pay | Admitting: Cardiology

## 2015-04-20 ENCOUNTER — Ambulatory Visit (INDEPENDENT_AMBULATORY_CARE_PROVIDER_SITE_OTHER): Payer: Medicare Other | Admitting: Cardiology

## 2015-04-20 VITALS — BP 110/60 | HR 57 | Ht 69.0 in | Wt 166.6 lb

## 2015-04-20 DIAGNOSIS — I480 Paroxysmal atrial fibrillation: Secondary | ICD-10-CM | POA: Diagnosis not present

## 2015-04-20 DIAGNOSIS — I491 Atrial premature depolarization: Secondary | ICD-10-CM | POA: Diagnosis not present

## 2015-04-20 DIAGNOSIS — I471 Supraventricular tachycardia: Secondary | ICD-10-CM

## 2015-04-20 NOTE — Patient Instructions (Signed)

## 2015-05-19 DIAGNOSIS — R42 Dizziness and giddiness: Secondary | ICD-10-CM | POA: Diagnosis not present

## 2015-05-19 DIAGNOSIS — M47812 Spondylosis without myelopathy or radiculopathy, cervical region: Secondary | ICD-10-CM | POA: Diagnosis not present

## 2015-05-24 ENCOUNTER — Other Ambulatory Visit: Payer: Self-pay | Admitting: Orthopaedic Surgery

## 2015-05-24 DIAGNOSIS — M542 Cervicalgia: Secondary | ICD-10-CM

## 2015-05-27 ENCOUNTER — Ambulatory Visit
Admission: RE | Admit: 2015-05-27 | Discharge: 2015-05-27 | Disposition: A | Discharge status: Home / Self Care | Payer: Medicare Other | Source: Ambulatory Visit | Attending: Orthopaedic Surgery | Admitting: Orthopaedic Surgery

## 2015-05-27 DIAGNOSIS — M542 Cervicalgia: Secondary | ICD-10-CM

## 2015-05-27 DIAGNOSIS — M5022 Other cervical disc displacement, mid-cervical region: Secondary | ICD-10-CM | POA: Diagnosis not present

## 2015-05-27 DIAGNOSIS — M5032 Other cervical disc degeneration, mid-cervical region: Secondary | ICD-10-CM | POA: Diagnosis not present

## 2015-05-27 DIAGNOSIS — M47812 Spondylosis without myelopathy or radiculopathy, cervical region: Secondary | ICD-10-CM | POA: Diagnosis not present

## 2015-05-28 ENCOUNTER — Other Ambulatory Visit: Payer: TRICARE For Life (TFL)

## 2015-06-09 DIAGNOSIS — M47812 Spondylosis without myelopathy or radiculopathy, cervical region: Secondary | ICD-10-CM | POA: Diagnosis not present

## 2015-06-19 DIAGNOSIS — R202 Paresthesia of skin: Secondary | ICD-10-CM | POA: Diagnosis not present

## 2015-06-20 DIAGNOSIS — M542 Cervicalgia: Secondary | ICD-10-CM | POA: Diagnosis not present

## 2015-06-27 DIAGNOSIS — M542 Cervicalgia: Secondary | ICD-10-CM | POA: Diagnosis not present

## 2015-06-29 DIAGNOSIS — M542 Cervicalgia: Secondary | ICD-10-CM | POA: Diagnosis not present

## 2015-07-03 DIAGNOSIS — M542 Cervicalgia: Secondary | ICD-10-CM | POA: Diagnosis not present

## 2015-07-06 DIAGNOSIS — M542 Cervicalgia: Secondary | ICD-10-CM | POA: Diagnosis not present

## 2015-07-11 DIAGNOSIS — M542 Cervicalgia: Secondary | ICD-10-CM | POA: Diagnosis not present

## 2015-07-14 DIAGNOSIS — M542 Cervicalgia: Secondary | ICD-10-CM | POA: Diagnosis not present

## 2015-07-18 DIAGNOSIS — M542 Cervicalgia: Secondary | ICD-10-CM | POA: Diagnosis not present

## 2015-07-20 DIAGNOSIS — M542 Cervicalgia: Secondary | ICD-10-CM | POA: Diagnosis not present

## 2015-09-18 ENCOUNTER — Other Ambulatory Visit: Payer: Self-pay

## 2015-09-18 ENCOUNTER — Ambulatory Visit (HOSPITAL_COMMUNITY)
Admission: RE | Admit: 2015-09-18 | Discharge: 2015-09-18 | Disposition: A | Discharge status: Home / Self Care | Payer: Medicare Other | Source: Ambulatory Visit | Attending: Nurse Practitioner | Admitting: Nurse Practitioner

## 2015-09-18 ENCOUNTER — Telehealth: Payer: Self-pay | Admitting: Cardiology

## 2015-09-18 VITALS — BP 114/68 | HR 62 | Ht 69.0 in | Wt 166.4 lb

## 2015-09-18 DIAGNOSIS — I4891 Unspecified atrial fibrillation: Secondary | ICD-10-CM | POA: Diagnosis present

## 2015-09-18 DIAGNOSIS — R0683 Snoring: Secondary | ICD-10-CM | POA: Diagnosis not present

## 2015-09-18 DIAGNOSIS — E7800 Pure hypercholesterolemia, unspecified: Secondary | ICD-10-CM | POA: Insufficient documentation

## 2015-09-18 DIAGNOSIS — J45909 Unspecified asthma, uncomplicated: Secondary | ICD-10-CM | POA: Insufficient documentation

## 2015-09-18 DIAGNOSIS — Z7982 Long term (current) use of aspirin: Secondary | ICD-10-CM | POA: Insufficient documentation

## 2015-09-18 DIAGNOSIS — Z79899 Other long term (current) drug therapy: Secondary | ICD-10-CM | POA: Insufficient documentation

## 2015-09-18 DIAGNOSIS — I480 Paroxysmal atrial fibrillation: Secondary | ICD-10-CM | POA: Diagnosis not present

## 2015-09-18 DIAGNOSIS — Z8249 Family history of ischemic heart disease and other diseases of the circulatory system: Secondary | ICD-10-CM | POA: Diagnosis not present

## 2015-09-18 LAB — COMPREHENSIVE METABOLIC PANEL
ALBUMIN: 4.3 g/dL (ref 3.5–5.0)
ALT: 20 U/L (ref 17–63)
ANION GAP: 9 (ref 5–15)
AST: 23 U/L (ref 15–41)
Alkaline Phosphatase: 50 U/L (ref 38–126)
BUN: 17 mg/dL (ref 6–20)
CHLORIDE: 104 mmol/L (ref 101–111)
CO2: 26 mmol/L (ref 22–32)
Calcium: 9.3 mg/dL (ref 8.9–10.3)
Creatinine, Ser: 1.07 mg/dL (ref 0.61–1.24)
GFR calc non Af Amer: >60 mL/min (ref >60)
GLUCOSE: 96 mg/dL (ref 65–99)
POTASSIUM: 4.2 mmol/L (ref 3.5–5.1)
SODIUM: 139 mmol/L (ref 135–145)
Total Bilirubin: 0.7 mg/dL (ref 0.3–1.2)
Total Protein: 7.1 g/dL (ref 6.5–8.1)

## 2015-09-18 LAB — TSH: TSH: 1.102 u[IU]/mL (ref 0.350–4.500)

## 2015-09-18 LAB — CBC
HCT: 46 % (ref 39.0–52.0)
HEMOGLOBIN: 16 g/dL (ref 13.0–17.0)
MCH: 32.1 pg (ref 26.0–34.0)
MCHC: 34.8 g/dL (ref 30.0–36.0)
MCV: 92.4 fL (ref 78.0–100.0)
PLATELETS: 210 10*3/uL (ref 150–400)
RBC: 4.98 MIL/uL (ref 4.22–5.81)
RDW: 12.8 % (ref 11.5–15.5)
WBC: 6.5 10*3/uL (ref 4.0–10.5)

## 2015-09-18 NOTE — Telephone Encounter (Signed)
Patient c/o Palpitations:  High priority if patient c/o lightheadedness and shortness of breath.  1. How long have you been having palpitations? Friday night   2. Are you currently experiencing lightheadedness and shortness of breath?No  3. Have you checked your BP and heart rate? (document readings) 10.21.16     90/60  When he was in afib but now it is 110/80  4. Are you experiencing any other symptoms? Pt stated he isn't feel right.   Pt want to come in office to be seen.

## 2015-09-18 NOTE — Telephone Encounter (Signed)
Patient st Friday night at 1900, he went into Afib. He took a extra dose of flecainide at this time. About 0100 Saturday morning, the Afib persisted and he took another extra dose and went to sleep. Around 0600 Saturday morning, he woke up and was still in Afib. He took his regular scheduled dose of flecainide and the Afib stopped.  Since then, he has been in a normal rhythm, but st "he just doesn't feel right." He has been jittery and a little short-winded since.  Today, his BP is 110/80. Per Dr. Radford Pax, patient to be seen in the Afib Clinic for evaluation.  Scheduled patient today with Roderic Palau at 1530. Patient understands directions to Afib Clinic and phone number provided for assistance if he needs it.  Patient grateful for callback.

## 2015-09-18 NOTE — Telephone Encounter (Signed)
F/u       Pt calling back to f/u on previous message. Please call cell phone.

## 2015-09-19 ENCOUNTER — Encounter (HOSPITAL_COMMUNITY): Payer: Self-pay | Admitting: Nurse Practitioner

## 2015-09-19 NOTE — Progress Notes (Signed)
Patient ID: David Wright, male   DOB: September 12, 1946, 69 y.o.   MRN: [id number]     Primary Care Physician: No primary care provider on file. Referring Physician: Dr. Talmadge Chad Cormier is a 69 y.o. male with a h/o PAF, atrial tachycardia, PAC's, pt of Dr. Ashok Norris, being seen in  [place] clinic. He has been on flecainide for several years and usually maintains SR. He developed afib, Friday pm at 7 pm. He took 100 mg flecainide( usual dose 50 mg) and went to sleep around 11 pm, At 1 am, he woke up and was still in afib and took another 50 mg. Saturday am, he was still in afib and took his usual flecainide and toprol and within a few hours went back into SR. No further afib since then but feels tired. He only reports one other afib breakthrough in September for 3-5 hours that returned to [place] without intervention. He has a chadsvasc score of 1, for age and continues on asa. He does not know of any triggers. Snoring with possible apnea spells but has not had a sleep study. No alcohol or excessive caffeine.  Wife mentions two episodes over the last 2 weeks where the pt has had brief memory lapses, one time he did not remember going to the doctor the day before and a few days later, could not remember what TV shows corresponded to what TV channels. Both of these episodes cleared after 15 minutes. The pt refused to go to the doctor but, episodes are troubling for TIA's, if so, he would need to be on blood thinner by guidelines. The wife sees her neurologist tomorrow and she was encouraged to make him an appointment to have these episodes evaluated.  Today, he denies symptoms of palpitations, chest pain, shortness of breath, orthopnea, PND, lower extremity edema, dizziness, presyncope, syncope, or neurologic sequela. The patient is tolerating medications without difficulties and is otherwise without complaint today.   Past Medical History  Diagnosis Date  . Anginal pain ([place])     has been worked-up but all neg.   . Asthma   . Hypercholesteremia   . Arthritis   . Paroxysmal atrial fibrillation (HCC)     atril fib  . PAC (premature atrial contraction)   . Atrial tachycardia, paroxysmal ([place])   . Atrial fibrillation ([place])   . Supraventricular premature beats    Past Surgical History  Procedure Laterality Date  . Perianal fistula    . Eye surgery    . Tonsillectomy    . 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 Total hip arthroplasty  10/27/2012    Procedure: TOTAL HIP ARTHROPLASTY ANTERIOR APPROACH;  Surgeon: Hessie Dibble, MD;  Location: [facility];  Service: Orthopedics;  Laterality: Right;    Current Outpatient Prescriptions  Medication Sig Dispense Refill  . aspirin 81 MG tablet Take 1 tablet (81 mg total) by mouth 2 (two) times daily at 10 AM and 5 PM. (Patient taking differently: Take 81 mg by mouth daily. ) 30 tablet 1  . flecainide (TAMBOCOR) 50 MG tablet Take 1 tablet (50 mg total) by mouth 2 (two) times daily. 180 tablet 3  . fluticasone (FLONASE) 50 MCG/ACT nasal spray Place 2 sprays into the nose daily.    . Fluticasone-Salmeterol (ADVAIR DISKUS) 100-50 MCG/DOSE AEPB Inhale 1 puff into the lungs daily.    . metoprolol tartrate (LOPRESSOR) 25 MG tablet Take 0.5 tablets (12.5 mg total) by mouth 2 (two)  times daily. 90 tablet 3   No current facility-administered medications for this encounter.    No Known Allergies  Social History   Social History  . Marital Status: Married    Spouse Name: N/A  . Number of Children: N/A  . Years of Education: N/A   Occupational History  . Not on file.   Social History Main Topics  . Smoking status: Never Smoker   . Smokeless tobacco: Never Used  . Alcohol Use: Yes     Comment: occasionally  . Drug Use: No  . Sexual Activity: Not on file   Other Topics Concern  . Not on file   Social History Narrative    Family History  Problem Relation Age of Onset  . Heart attack Mother   . Heart disease Mother   . Heart attack  Father   . Heart disease Father   . Pancreatic cancer Sister     ROS- All systems are reviewed and negative except as per the HPI above  Physical Exam: Filed Vitals:   09/18/15 1551  BP: 114/68  Pulse: 62  Height: 5\' 9"  (1.753 m)  Weight: 166 lb 6.4 oz (75.479 kg)    GEN- The patient is well appearing, alert and oriented x 3 today.   Head- normocephalic, atraumatic Eyes-  Sclera clear, conjunctiva pink Ears- hearing intact Oropharynx- clear Neck- supple, no JVP Lymph- no cervical lymphadenopathy Lungs- Clear to ausculation bilaterally, normal work of breathing Heart- Regular rate and rhythm, no murmurs, rubs or gallops, PMI not laterally displaced GI- soft, NT, ND, + BS Extremities- no clubbing, cyanosis, or edema MS- no significant deformity or atrophy Skin- no rash or lesion Psych- euthymic mood, full affect Neuro- strength and sensation are intact  EKG-NSR 62 bpm, pr int 74 ms, qrs int 432 ms, qtc 432 ms Epic records reviewed Echo- 2013, LVF normal  Assessment and Plan: 1.PAF Resolved Continue flecainide/toprol No labs on file since 2013, cmet, cbc, tsh  2. Chadsvasc score of 1(age) On ASA by guidelines but had two transient worrisome episodes recently involving memory loss Wife is going to see about scheduling pt an appointment tomorrow when she sees her neurologist If determined that these are TIA's, he will meet criteria for being on a blood thinner  3. Intermittent Snoring/possible apnea Can talk to Dr. Radford Pax re sleep study on next visit  F/u with Dr. Radford Pax as on recall, afib clinic as needed  Geroge Baseman. Ciro Tashiro, [facility] [address] [place], [place] [address] [phone]

## 2015-10-16 DIAGNOSIS — Z91013 Allergy to seafood: Secondary | ICD-10-CM | POA: Diagnosis not present

## 2015-10-16 DIAGNOSIS — J301 Allergic rhinitis due to pollen: Secondary | ICD-10-CM | POA: Diagnosis not present

## 2015-10-16 DIAGNOSIS — J3089 Other allergic rhinitis: Secondary | ICD-10-CM | POA: Diagnosis not present

## 2015-10-16 DIAGNOSIS — J4530 Mild persistent asthma, uncomplicated: Secondary | ICD-10-CM | POA: Diagnosis not present

## 2015-10-23 ENCOUNTER — Encounter (HOSPITAL_COMMUNITY): Payer: Self-pay | Admitting: Nurse Practitioner

## 2015-10-23 ENCOUNTER — Ambulatory Visit (HOSPITAL_COMMUNITY)
Admission: RE | Admit: 2015-10-23 | Discharge: 2015-10-23 | Disposition: A | Discharge status: Home / Self Care | Payer: Medicare Other | Source: Ambulatory Visit | Attending: Nurse Practitioner | Admitting: Nurse Practitioner

## 2015-10-23 ENCOUNTER — Ambulatory Visit (HOSPITAL_COMMUNITY): Payer: TRICARE For Life (TFL) | Admitting: Nurse Practitioner

## 2015-10-23 VITALS — BP 116/76 | HR 55 | Ht 69.0 in | Wt 168.8 lb

## 2015-10-23 DIAGNOSIS — R0683 Snoring: Secondary | ICD-10-CM | POA: Insufficient documentation

## 2015-10-23 DIAGNOSIS — I480 Paroxysmal atrial fibrillation: Secondary | ICD-10-CM | POA: Insufficient documentation

## 2015-10-23 MED ORDER — METOPROLOL TARTRATE 25 MG PO TABS: 12.5000 mg | ORAL_TABLET | Freq: Two times a day (BID) | ORAL | Status: DC

## 2015-10-23 MED ORDER — FLECAINIDE ACETATE 50 MG PO TABS: 50.0000 mg | ORAL_TABLET | Freq: Two times a day (BID) | ORAL | Status: DC

## 2015-10-23 NOTE — Progress Notes (Signed)
Patient ID: David Wright, male   DOB: May 02, 1946, 69 y.o.   MRN: [id number]      Primary Care Physician: No primary care provider on file. Referring Physician: Dr. Talmadge Chad Wright is a 69 y.o. male with a h/o PAF, atrial tachycardia, PAC's, pt of David Wright, being seen in  [place] clinic. He has been on flecainide for several years and usually maintains SR. He developed afib, Friday pm at 7 pm. He took 100 mg flecainide( usual dose 50 mg) and went to sleep around 11 pm, At 1 am, he woke up and was still in afib and took another 50 mg. Saturday am, he was still in afib and took his usual flecainide and toprol and within a few hours went back into SR. No further afib since then but feels tired. He only reports one other afib breakthrough in September for 3-5 hours that returned to David Wright without intervention. He has a chadsvasc score of 1, for age and continues on asa. He does not know of any triggers. Snoring with possible apnea spells but has not had a sleep study. No alcohol or excessive caffeine.  Wife mentioned two episodes over the last 2 weeks where the pt has had brief memory lapses, one time he did not remember going to the doctor the day before and a few days later, could not remember what TV shows corresponded to what TV channels. Both of these episodes cleared after 15 minutes. The pt refused to go to the doctor but, episodes are troubling for TIA's, if so, he would need to be on blood thinner by guidelines. The wife sees her neurologist tomorrow and she was encouraged to make him an appointment to have these episodes evaluated.  Pt returns to the afib clinic 11/28 and has no further issues with afib or memory laspes, everything has been back to normal. He feels well. He has a chadsvasc score of 1, so he chooses to continue with asa, according to guidelines, this is acceptable. Continues on flecainide. The wife cancelled her neuro appointment so did not mention to her doactor her husbands two  memory lapses, but they have not reoccurred.  Today, he denies symptoms of palpitations, chest pain, shortness of breath, orthopnea, PND, lower extremity edema, dizziness, presyncope, syncope, or neurologic sequela. The patient is tolerating medications without difficulties and is otherwise without complaint today.   Past Medical History  Diagnosis Date  . Anginal pain ([place])     has been worked-up but all neg.  . Asthma   . Hypercholesteremia   . Arthritis   . Paroxysmal atrial fibrillation (HCC)     atril fib  . PAC (premature atrial contraction)   . Atrial tachycardia, paroxysmal ([place])   . Atrial fibrillation ([place])   . Supraventricular premature beats    Past Surgical History  Procedure Laterality Date  . Perianal fistula    . Eye surgery    . Tonsillectomy    . 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 Total hip arthroplasty  10/27/2012    Procedure: TOTAL HIP ARTHROPLASTY ANTERIOR APPROACH;  Surgeon: Hessie Dibble, MD;  Location: [facility];  Service: Orthopedics;  Laterality: Right;    Current Outpatient Prescriptions  Medication Sig Dispense Refill  . aspirin 81 MG tablet Take 1 tablet (81 mg total) by mouth 2 (two) times daily at 10 AM and 5 PM. (Patient taking differently: Take 81 mg by mouth daily. ) 30 tablet 1  .  flecainide (TAMBOCOR) 50 MG tablet Take 1 tablet (50 mg total) by mouth 2 (two) times daily. 180 tablet 3  . fluticasone (FLONASE) 50 MCG/ACT nasal spray Place 2 sprays into the nose daily.    . Fluticasone-Salmeterol (ADVAIR DISKUS) 100-50 MCG/DOSE AEPB Inhale 1 puff into the lungs daily.    . metoprolol tartrate (LOPRESSOR) 25 MG tablet Take 0.5 tablets (12.5 mg total) by mouth 2 (two) times daily. 90 tablet 3   No current facility-administered medications for this encounter.    No Known Allergies  Social History   Social History  . Marital Status: Married    Spouse Name: N/A  . Number of Children: N/A  . Years of Education: N/A    Occupational History  . Not on file.   Social History Main Topics  . Smoking status: Never Smoker   . Smokeless tobacco: Never Used  . Alcohol Use: Yes     Comment: occasionally  . Drug Use: No  . Sexual Activity: Not on file   Other Topics Concern  . Not on file   Social History Narrative    Family History  Problem Relation Age of Onset  . Heart attack Mother   . Heart disease Mother   . Heart attack Father   . Heart disease Father   . Pancreatic cancer Sister     ROS- All systems are reviewed and negative except as per the HPI above  Physical Exam: Filed Vitals:   10/23/15 1103  BP: 116/76  Pulse: 55  Height: 5\' 9"  (1.753 m)  Weight: 168 lb 12.8 oz (76.567 kg)    GEN- The patient is well appearing, alert and oriented x 3 today.   Head- normocephalic, atraumatic Eyes-  Sclera clear, conjunctiva pink Ears- hearing intact Oropharynx- clear Neck- supple, no JVP Lymph- no cervical lymphadenopathy Lungs- Clear to ausculation bilaterally, normal work of breathing Heart- Regular rate and rhythm, no murmurs, rubs or gallops, PMI not laterally displaced GI- soft, NT, ND, + BS Extremities- no clubbing, cyanosis, or edema MS- no significant deformity or atrophy Skin- no rash or lesion Psych- euthymic mood, full affect Neuro- strength and sensation are intact  EKG-NSR 62 bpm, pr int 74 ms, qrs int 432 ms, qtc 432 ms Epic records reviewed Echo- 2013, LVF normal  Assessment and Plan: 1.PAF Resolved Continue flecainide/toprol  cmet, cbc, tsh drawn on last visit wnl.  2. Chadsvasc score of 1(age) Continue on asa.  3. Intermittent Snoring/possible apnea Can talk to David Wright re sleep study on next visit  F/u afib clinic in 3 months, sooner if needed.  David Wright, David Wright [address] [place], [place] [address] [phone]

## 2016-01-23 ENCOUNTER — Ambulatory Visit (HOSPITAL_COMMUNITY)
Admission: RE | Admit: 2016-01-23 | Discharge: 2016-01-23 | Disposition: A | Discharge status: Home / Self Care | Payer: Medicare Other | Source: Ambulatory Visit | Attending: Nurse Practitioner | Admitting: Nurse Practitioner

## 2016-01-23 VITALS — BP 128/78 | HR 56 | Ht 69.0 in | Wt 166.8 lb

## 2016-01-23 DIAGNOSIS — Z79899 Other long term (current) drug therapy: Secondary | ICD-10-CM | POA: Diagnosis not present

## 2016-01-23 DIAGNOSIS — Z8249 Family history of ischemic heart disease and other diseases of the circulatory system: Secondary | ICD-10-CM | POA: Diagnosis not present

## 2016-01-23 DIAGNOSIS — J45909 Unspecified asthma, uncomplicated: Secondary | ICD-10-CM | POA: Insufficient documentation

## 2016-01-23 DIAGNOSIS — E7800 Pure hypercholesterolemia, unspecified: Secondary | ICD-10-CM | POA: Diagnosis not present

## 2016-01-23 DIAGNOSIS — I480 Paroxysmal atrial fibrillation: Secondary | ICD-10-CM | POA: Diagnosis not present

## 2016-01-23 DIAGNOSIS — Z7982 Long term (current) use of aspirin: Secondary | ICD-10-CM | POA: Diagnosis not present

## 2016-01-23 NOTE — Progress Notes (Signed)
Patient ID: David Wright, male   DOB: 10-23-1946, 70 y.o.   MRN: [id number]      Primary Care Physician: No primary care provider on file. Referring Physician: Dr. Talmadge Chad Fandrich is a 70 y.o. male with a h/o PAF, atrial tachycardia, PAC's, pt of Dr. Ashok Norris, being seen in  [place] clinic. He has been on flecainide for several years and usually maintains SR. He developed afib, last October.. He took 100 mg flecainide( usual dose 50 mg) and went to sleep around 11 pm, At 1 am, he woke up and was still in afib and took another 50 mg. Saturday am, he was still in afib and took his usual flecainide and toprol and within a few hours went back into SR. No further afib since then but feels tired. He only reports one other afib breakthrough in September for 3-5 hours that returned to [place] without intervention. He has a chadsvasc score of 1, for age and continues on asa. He does not know of any triggers. Snoring with possible apnea spells but has not had a sleep study. No alcohol or excessive caffeine.  Pt returns to the afib clinic 11/28 and has no further issues with afib , everything has been back to normal. He feels well. He has a chadsvasc score of 1, so he chooses to continue with asa, according to guidelines, this is acceptable. Continues on flecainide. The wife cancelled her neuro appointment so did not mention to her doactor her husbands two memory lapses, but they have not reoccurred.  In afib clinic 2/28, he has only had one further episode of afib , 8 hours last week. He took one extra 50 mg flecainide and converted within several hours. So, overall about 2-3 breakthrough episodes over one year on flecainide which is very reasonable. The pt would like appointment with Dr. Rayann Heman within the next few months to establish in case afib burden worsens and at that point, he would like to be considered for ablation.  Today, he denies symptoms of palpitations, chest pain, shortness of breath, orthopnea,  PND, lower extremity edema, dizziness, presyncope, syncope, or neurologic sequela. The patient is tolerating medications without difficulties and is otherwise without complaint today.   Past Medical History  Diagnosis Date  . Anginal pain ([place])     has been worked-up but all neg.  . Asthma   . Hypercholesteremia   . Arthritis   . Paroxysmal atrial fibrillation (HCC)     atril fib  . PAC (premature atrial contraction)   . Atrial tachycardia, paroxysmal ([place])   . Atrial fibrillation ([place])   . Supraventricular premature beats    Past Surgical History  Procedure Laterality Date  . Perianal fistula    . Eye surgery    . Tonsillectomy    . 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 Total hip arthroplasty  10/27/2012    Procedure: TOTAL HIP ARTHROPLASTY ANTERIOR APPROACH;  Surgeon: Hessie Dibble, MD;  Location: [facility];  Service: Orthopedics;  Laterality: Right;    Current Outpatient Prescriptions  Medication Sig Dispense Refill  . aspirin 81 MG tablet Take 1 tablet (81 mg total) by mouth 2 (two) times daily at 10 AM and 5 PM. (Patient taking differently: Take 81 mg by mouth daily. ) 30 tablet 1  . flecainide (TAMBOCOR) 50 MG tablet Take 1 tablet (50 mg total) by mouth 2 (two) times daily. 180 tablet 3  . fluticasone (FLONASE) 50 MCG/ACT  nasal spray Place 2 sprays into the nose daily.    . Fluticasone-Salmeterol (ADVAIR DISKUS) 100-50 MCG/DOSE AEPB Inhale 1 puff into the lungs daily.    . metoprolol tartrate (LOPRESSOR) 25 MG tablet Take 0.5 tablets (12.5 mg total) by mouth 2 (two) times daily. 90 tablet 3   No current facility-administered medications for this encounter.    No Known Allergies  Social History   Social History  . Marital Status: Married    Spouse Name: N/A  . Number of Children: N/A  . Years of Education: N/A   Occupational History  . Not on file.   Social History Main Topics  . Smoking status: Never Smoker   . Smokeless tobacco: Never Used  .  Alcohol Use: Yes     Comment: occasionally  . Drug Use: No  . Sexual Activity: Not on file   Other Topics Concern  . Not on file   Social History Narrative    Family History  Problem Relation Age of Onset  . Heart attack Mother   . Heart disease Mother   . Heart attack Father   . Heart disease Father   . Pancreatic cancer Sister     ROS- All systems are reviewed and negative except as per the HPI above  Physical Exam: Filed Vitals:   01/23/16 1038  BP: 128/78  Pulse: 56  Height: 5\' 9"  (1.753 m)  Weight: 166 lb 12.8 oz (75.66 kg)    GEN- The patient is well appearing, alert and oriented x 3 today.   Head- normocephalic, atraumatic Eyes-  Sclera clear, conjunctiva pink Ears- hearing intact Oropharynx- clear Neck- supple, no JVP Lymph- no cervical lymphadenopathy Lungs- Clear to ausculation bilaterally, normal work of breathing Heart- Regular rate and rhythm, no murmurs, rubs or gallops, PMI not laterally displaced GI- soft, NT, ND, + BS Extremities- no clubbing, cyanosis, or edema MS- no significant deformity or atrophy Skin- no rash or lesion Psych- euthymic mood, full affect Neuro- strength and sensation are intact  EKG-NSR 56  bpm, pr int 74 ms, qrs int 88 ms, qtc 423 ms Epic records reviewed Echo- 2013, LVF normal, left atrium normal  Assessment and Plan: 1.PAF with low afib burden Continue flecainide/toprol  2. Chadsvasc score of 1(age) Continue on asa.  Appointment requested   for pt to get established with Dr. Rayann Heman F/u afib clinic as needed  Butch Penny C. Carroll, [facility] [address] [place], [place] [address] [phone]

## 2016-02-15 DIAGNOSIS — H25033 Anterior subcapsular polar age-related cataract, bilateral: Secondary | ICD-10-CM | POA: Diagnosis not present

## 2016-04-17 ENCOUNTER — Institutional Professional Consult (permissible substitution): Payer: TRICARE For Life (TFL) | Admitting: Internal Medicine

## 2016-04-29 ENCOUNTER — Ambulatory Visit (INDEPENDENT_AMBULATORY_CARE_PROVIDER_SITE_OTHER): Payer: Medicare Other | Admitting: Internal Medicine

## 2016-04-29 ENCOUNTER — Encounter: Payer: Self-pay | Admitting: Internal Medicine

## 2016-04-29 ENCOUNTER — Telehealth: Payer: Self-pay | Admitting: Internal Medicine

## 2016-04-29 VITALS — BP 112/78 | HR 57 | Ht 69.0 in | Wt 168.2 lb

## 2016-04-29 DIAGNOSIS — R0683 Snoring: Secondary | ICD-10-CM

## 2016-04-29 DIAGNOSIS — I480 Paroxysmal atrial fibrillation: Secondary | ICD-10-CM

## 2016-04-29 NOTE — Patient Instructions (Addendum)
Medication Instructions:  Your physician recommends that you continue on your current medications as directed. Please refer to the Current Medication list given to you today.   Labwork: None ordered   Testing/Procedures: Your physician has requested that you have an echocardiogram. Echocardiography is a painless test that uses sound waves to create images of your heart. It provides your doctor with information about the size and shape of your heart and how well your heart's chambers and valves are working. This procedure takes approximately one hour. There are no restrictions for this procedure.    Follow-Up: You have been referred to Dr Elgie Collard study)  Your physician wants you to follow-up in: 6 months with Roderic Palau, NP You will receive a reminder letter in the mail two months in advance. If you don't receive a letter, please call our office to schedule the follow-up appointment.    Any Other Special Instructions Will Be Listed Below (If Applicable).     If you need a refill on your cardiac medications before your next appointment, please call your pharmacy.

## 2016-04-29 NOTE — Progress Notes (Signed)
Electrophysiology Office Note   Date:  04/29/2016   ID:  David Wright, DOB 01-Mar-1946, MRN [id number]  PCP:  Dr Seward Carol Cardiologist:  Dr Radford Pax Primary Electrophysiologist: Thompson Grayer, MD    Chief Complaint  Patient presents with  . Atrial Fibrillation     History of Present Illness: David Wright is a 70 y.o. male who presents today for electrophysiology evaluation.   He reports having atrial fibrillation initially diagnosed 01/24/2011.  He was exercising and at the [facility] and developed tachypalpitations.  He felt "weak and tired".  He was evaluated by [facility] and found to have atrial fibrillation with RVR.  He was admitted to [facility] and started on metoprolol 25mg  BID.  He did well until 08/19/11 when he had recurrent afib with RVR.  He converted to sinus with IV diltiazem.  He was discharged with cardizem CD 180mg  daily which he currently does not take.  He was placed on flecainide 50mg  BID at that time.  He has done well.  He has had no 4-5 episodes in the past 5 years.  His most recent episode was a month ago.  He is unaware of triggers/ precipitants for his afib.  Episodes will last 1-2 hours.  He has begun taking an additional 50mg  tablet of flecainide when needed.    Today, he denies symptoms of chest pain, shortness of breath, orthopnea, PND, lower extremity edema, claudication, dizziness, presyncope, syncope, bleeding, or neurologic sequela. The patient is tolerating medications without difficulties and is otherwise without complaint today.    Past Medical History  Diagnosis Date  . Anginal pain ([place])     has been worked-up but all neg.  . Asthma   . Hypercholesteremia     pt reports that his cholesterol is normal  . Arthritis   . Paroxysmal atrial fibrillation (HCC)   . PAC (premature atrial contraction)   . Atrial tachycardia, paroxysmal ([place])   . Snores    Past Surgical History  Procedure Laterality Date  . Perianal fistula    . Eye surgery    .  Tonsillectomy    . Total hip arthroplasty  10/27/2012    RIGHT HIP     Current Outpatient Prescriptions  Medication Sig Dispense Refill  . aspirin 81 MG tablet Take 81 mg by mouth daily.    . flecainide (TAMBOCOR) 50 MG tablet Take 1 tablet (50 mg total) by mouth 2 (two) times daily. 180 tablet 3  . fluticasone (FLONASE) 50 MCG/ACT nasal spray Place 2 sprays into the nose daily.    . Fluticasone-Salmeterol (ADVAIR DISKUS) 100-50 MCG/DOSE AEPB Inhale 1 puff into the lungs daily.    . metoprolol tartrate (LOPRESSOR) 25 MG tablet Take 0.5 tablets (12.5 mg total) by mouth 2 (two) times daily. 90 tablet 3   No current facility-administered medications for this visit.    Allergies:   Review of patient's allergies indicates no known allergies.   Social History:  The patient  reports that he has never smoked. He has never used smokeless tobacco. He reports that he drinks alcohol. He reports that he does not use illicit drugs.   Family History:  The patient's  family history includes Heart attack in his father and mother; Heart disease in his father and mother; Pancreatic cancer in his sister.    ROS:  Please see the history of present illness.   All other systems are reviewed and negative.    PHYSICAL EXAM: VS:  BP 112/78 mmHg  Pulse  57  Ht 5\' 9"  (1.753 m)  Wt 168 lb 4 oz (76.318 kg)  BMI 24.83 kg/m2 , BMI Body mass index is 24.83 kg/(m^2). GEN: Well nourished, well developed, in no acute distress HEENT: normal Neck: no JVD, carotid bruits, or masses Cardiac: RRR; no murmurs, rubs, or gallops,no edema  Respiratory:  clear to auscultation bilaterally, normal work of breathing GI: soft, nontender, nondistended, + BS MS: no deformity or atrophy Skin: warm and dry  Neuro:  Strength and sensation are intact Psych: euthymic mood, full affect  EKG:  EKG is ordered today. The ekg ordered today shows sinus rhythm 57 bpm, PR 154 msec, QRS 92 msec, QTc 438 msec   Recent Labs: 09/18/2015:  ALT 20; BUN 17; Creatinine, Ser 1.07; Hemoglobin 16.0; Platelets 210; Potassium 4.2; Sodium 139; TSH 1.102    Lipid Panel     Component Value Date/Time   CHOL * 01/24/2011 1714    232        ATP III CLASSIFICATION:  <200     mg/dL   Desirable  200-239  mg/dL   Borderline High  >=240    mg/dL   High          TRIG 172* 01/24/2011 1714   HDL 58 01/24/2011 1714   CHOLHDL 4.0 01/24/2011 1714   VLDL 34 01/24/2011 1714   LDLCALC * 01/24/2011 1714    140        Total Cholesterol/HDL:CHD Risk Coronary Heart Disease Risk Table                     Men   Women  1/2 Average Risk   3.4   3.3  Average Risk       5.0   4.4  2 X Average Risk   9.6   7.1  3 X Average Risk  23.4   11.0        Use the calculated Patient Ratio above and the CHD Risk Table to determine the patient's CHD Risk.        ATP III CLASSIFICATION (LDL):  <100     mg/dL   Optimal  100-129  mg/dL   Near or Above                    Optimal  130-159  mg/dL   Borderline  160-189  mg/dL   High  >190     mg/dL   Very High     Wt Readings from Last 3 Encounters:  04/29/16 168 lb 4 oz (76.318 kg)  01/23/16 166 lb 12.8 oz (75.66 kg)  10/23/15 168 lb 12.8 oz (76.567 kg)      Other studies Reviewed: Additional studies/ records that were reviewed today include: AF clinic notes, prior hospital records   ASSESSMENT AND PLAN:  1.  Paroxysmal atrial fibrillation Doing well with flecainide Lifestyle modification encouraged Update echo Given snoring, will refer to Dr Maxwell Caul (knows patient) for sleep study chads2vasc score is 1.  Continue xarelto Therapeutic strategies for afib including medicine and ablation were discussed in detail with the patient today. Risk, benefits, and alternatives to EP study and radiofrequency ablation for afib were also discussed in detail today. At this time, he would prefer to continue medical therapy. If afib advances, he may be more willing to consider ablation Though notes describe atach,  I only see afib documented  2. Snoring As above  Follow-up with Butch Penny in the AF clinic every 6 months I will  see when needed  Current medicines are reviewed at length with the patient today.   The patient does not have concerns regarding his medicines.  The following changes were made today:  none   Signed, Thompson Grayer, MD  04/29/2016 12:02 PM     [place] [address] [phone] (office) [phone] (fax)

## 2016-05-16 ENCOUNTER — Other Ambulatory Visit: Payer: Self-pay

## 2016-05-16 ENCOUNTER — Ambulatory Visit (HOSPITAL_COMMUNITY): Payer: Medicare Other | Attending: Cardiology

## 2016-05-16 DIAGNOSIS — I517 Cardiomegaly: Secondary | ICD-10-CM | POA: Diagnosis not present

## 2016-05-16 DIAGNOSIS — I4891 Unspecified atrial fibrillation: Secondary | ICD-10-CM | POA: Diagnosis present

## 2016-05-16 DIAGNOSIS — E785 Hyperlipidemia, unspecified: Secondary | ICD-10-CM | POA: Diagnosis not present

## 2016-05-16 DIAGNOSIS — I340 Nonrheumatic mitral (valve) insufficiency: Secondary | ICD-10-CM | POA: Insufficient documentation

## 2016-05-16 DIAGNOSIS — I77810 Thoracic aortic ectasia: Secondary | ICD-10-CM | POA: Diagnosis not present

## 2016-05-16 DIAGNOSIS — I480 Paroxysmal atrial fibrillation: Secondary | ICD-10-CM

## 2016-05-16 LAB — ECHOCARDIOGRAM COMPLETE
Ao-asc: 35 cm
CHL CUP MV DEC (S): 246
CHL CUP PV REG GRAD DIAS: 7 mmHg
CHL CUP REG VEL DIAS: 129 cm/s
CHL CUP RV SYS PRESS: 33 mmHg
E decel time: 246 msec
E/e' ratio: 12.09
FS: 37 % (ref 28–44)
IVS/LV PW RATIO, ED: 0.96
LA diam end sys: 40 mm
LA vol A4C: 51 ml
LA vol index: 28.2 mL/m2
LA vol: 54.6 mL
LADIAMINDEX: 2.07 cm/m2
LASIZE: 40 mm
LV PW d: 8.11 mm — AB (ref 0.6–1.1)
LV TDI E'LATERAL: 8.27
LV TDI E'MEDIAL: 7.62
LVEEAVG: 12.09
LVEEMED: 12.09
LVELAT: 8.27 cm/s
LVOT VTI: 23.2 cm
LVOT area: 3.8 cm2
LVOTD: 22 mm
LVOTPV: 103 cm/s
LVOTSV: 88 mL
MV pk A vel: 106 m/s
MVPG: 4 mmHg
MVPKEVEL: 100 m/s
RV LATERAL S' VELOCITY: 10.4 cm/s
Reg peak vel: 252 cm/s
TRMAXVEL: 252 cm/s

## 2016-05-20 ENCOUNTER — Telehealth: Payer: Self-pay | Admitting: Internal Medicine

## 2016-05-20 NOTE — Telephone Encounter (Signed)
Returned call to patient and informed of results

## 2016-05-20 NOTE — Telephone Encounter (Signed)
Pt calling for results from Echo 05-16-16

## 2016-06-13 DIAGNOSIS — Z1389 Encounter for screening for other disorder: Secondary | ICD-10-CM | POA: Diagnosis not present

## 2016-06-13 DIAGNOSIS — E782 Mixed hyperlipidemia: Secondary | ICD-10-CM | POA: Diagnosis not present

## 2016-06-13 DIAGNOSIS — Z23 Encounter for immunization: Secondary | ICD-10-CM | POA: Diagnosis not present

## 2016-06-13 DIAGNOSIS — Z6826 Body mass index (BMI) 26.0-26.9, adult: Secondary | ICD-10-CM | POA: Diagnosis not present

## 2016-06-13 DIAGNOSIS — Z0000 Encounter for general adult medical examination without abnormal findings: Secondary | ICD-10-CM | POA: Diagnosis not present

## 2016-06-13 DIAGNOSIS — Z1211 Encounter for screening for malignant neoplasm of colon: Secondary | ICD-10-CM | POA: Diagnosis not present

## 2016-07-26 ENCOUNTER — Other Ambulatory Visit: Payer: Self-pay | Admitting: Gastroenterology

## 2016-09-16 ENCOUNTER — Encounter (HOSPITAL_COMMUNITY): Payer: Self-pay | Admitting: *Deleted

## 2016-09-23 ENCOUNTER — Encounter (HOSPITAL_COMMUNITY)
Admission: RE | Disposition: A | Discharge status: Home / Self Care | Payer: Self-pay | Source: Ambulatory Visit | Attending: Gastroenterology

## 2016-09-23 ENCOUNTER — Encounter (HOSPITAL_COMMUNITY): Payer: Self-pay

## 2016-09-23 ENCOUNTER — Ambulatory Visit (HOSPITAL_COMMUNITY): Payer: Medicare Other | Admitting: Anesthesiology

## 2016-09-23 ENCOUNTER — Ambulatory Visit (HOSPITAL_COMMUNITY)
Admission: RE | Admit: 2016-09-23 | Discharge: 2016-09-23 | Disposition: A | Discharge status: Home / Self Care | Payer: Medicare Other | Source: Ambulatory Visit | Attending: Gastroenterology | Admitting: Gastroenterology

## 2016-09-23 DIAGNOSIS — I480 Paroxysmal atrial fibrillation: Secondary | ICD-10-CM | POA: Insufficient documentation

## 2016-09-23 DIAGNOSIS — J45909 Unspecified asthma, uncomplicated: Secondary | ICD-10-CM | POA: Diagnosis not present

## 2016-09-23 DIAGNOSIS — D649 Anemia, unspecified: Secondary | ICD-10-CM | POA: Insufficient documentation

## 2016-09-23 DIAGNOSIS — M1990 Unspecified osteoarthritis, unspecified site: Secondary | ICD-10-CM | POA: Insufficient documentation

## 2016-09-23 DIAGNOSIS — Z1211 Encounter for screening for malignant neoplasm of colon: Secondary | ICD-10-CM | POA: Insufficient documentation

## 2016-09-23 DIAGNOSIS — Z79899 Other long term (current) drug therapy: Secondary | ICD-10-CM | POA: Diagnosis not present

## 2016-09-23 DIAGNOSIS — K5730 Diverticulosis of large intestine without perforation or abscess without bleeding: Secondary | ICD-10-CM | POA: Insufficient documentation

## 2016-09-23 DIAGNOSIS — D124 Benign neoplasm of descending colon: Secondary | ICD-10-CM | POA: Diagnosis not present

## 2016-09-23 DIAGNOSIS — E7800 Pure hypercholesterolemia, unspecified: Secondary | ICD-10-CM | POA: Diagnosis not present

## 2016-09-23 HISTORY — DX: Adverse effect of unspecified anesthetic, initial encounter: T41.45XA

## 2016-09-23 HISTORY — DX: Headache, unspecified: R51.9

## 2016-09-23 HISTORY — PX: COLONOSCOPY WITH PROPOFOL: SHX5780

## 2016-09-23 HISTORY — DX: Headache: R51

## 2016-09-23 HISTORY — DX: Other complications of anesthesia, initial encounter: T88.59XA

## 2016-09-23 SURGERY — COLONOSCOPY WITH PROPOFOL
Anesthesia: Monitor Anesthesia Care

## 2016-09-23 MED ORDER — PROPOFOL 10 MG/ML IV BOLUS
INTRAVENOUS | Status: AC
  Filled 2016-09-23: qty 20

## 2016-09-23 MED ORDER — LACTATED RINGERS IV SOLN
INTRAVENOUS | Status: DC | PRN
  Administered 2016-09-23: 09:00:00 via INTRAVENOUS

## 2016-09-23 MED ORDER — PROPOFOL 10 MG/ML IV BOLUS
INTRAVENOUS | Status: DC | PRN
  Administered 2016-09-23 (×2): 100 mg via INTRAVENOUS
  Administered 2016-09-23: 50 mg via INTRAVENOUS

## 2016-09-23 SURGICAL SUPPLY — 21 items

## 2016-09-23 NOTE — Discharge Instructions (Signed)

## 2016-09-23 NOTE — Anesthesia Preprocedure Evaluation (Signed)
Anesthesia Evaluation  Patient identified by MRN, date of birth, ID band Patient awake    Reviewed: Allergy & Precautions, NPO status , Patient's Chart, lab work & pertinent test results  Airway Mallampati: I  TM Distance: >3 FB     Dental  (+) Teeth Intact   Pulmonary    breath sounds clear to auscultation       Cardiovascular + PND  + dysrhythmias  Rhythm:Regular Rate:Normal  HR controlled now    Neuro/Psych    GI/Hepatic negative GI ROS, Neg liver ROS,   Endo/Other  negative endocrine ROS  Renal/GU negative Renal ROS     Musculoskeletal  (+) Arthritis ,   Abdominal   Peds  Hematology  (+) anemia ,   Anesthesia Other Findings   Reproductive/Obstetrics                             Anesthesia Physical Anesthesia Plan  ASA: II  Anesthesia Plan: MAC   Post-op Pain Management:    Induction: Intravenous  Airway Management Planned: Simple Face Mask and Natural Airway  Additional Equipment:   Intra-op Plan:   Post-operative Plan:   Informed Consent: I have reviewed the patients History and Physical, chart, labs and discussed the procedure including the risks, benefits and alternatives for the proposed anesthesia with the patient or authorized representative who has indicated his/her understanding and acceptance.     Plan Discussed with: CRNA  Anesthesia Plan Comments:         Anesthesia Quick Evaluation

## 2016-09-23 NOTE — H&P (Signed)
Procedure: Baseline screening colonoscopy. No family history of colon cancer.  History: The patient is a 70 year old male born [birth year]. He is scheduled to undergo his first screening colonoscopy with polypectomy to prevent colon cancer.  Past medical history: Eye surgery following trauma.. Anal fistula surgery. Right total hip replacement surgery. Asthma. Paroxysmal atrial fibrillation.  Medication allergies: None  Exam: The patient is alert and lying comfortably on the endoscopy stretcher. Abdomen is soft and nontender to palpation. Lungs are clear to auscultation. Cardiac exam reveals a regular rhythm.  Plan: Proceed with screening colonoscopy

## 2016-09-23 NOTE — Op Note (Signed)
[facility] Patient Name: David Wright Procedure Date: 09/23/2016 MRN: [id number] Attending MD: Garlan Fair , MD Date of Birth: 1946/06/08 CSN: [id number] Age: 70 Admit Type: Outpatient Procedure:                Colonoscopy Indications:              Screening for colorectal malignant neoplasm Providers:                Garlan Fair, MD, Dustin Flock RN, RN, Despina Pole Tech, Technician, Lajuana Carry, CRNA Referring MD:              Medicines:                Propofol per Anesthesia Complications:            No immediate complications. Estimated Blood Loss:     Estimated blood loss: none. Procedure:                Pre-Anesthesia Assessment:                           - Prior to the procedure, a History and Physical                            was performed, and patient medications and                            allergies were reviewed. The patient's tolerance of                            previous anesthesia was also reviewed. The risks                            and benefits of the procedure and the sedation                            options and risks were discussed with the patient.                            All questions were answered, and informed consent                            was obtained. Prior Anticoagulants: The patient has                            taken aspirin, last dose was 2 days prior to                            procedure. ASA Grade Assessment: II - A patient                            with mild systemic disease. After reviewing the  risks and benefits, the patient was deemed in                            satisfactory condition to undergo the procedure.                           After obtaining informed consent, the colonoscope                            was passed under direct vision. Throughout the                            procedure, the patient's blood pressure, pulse, and                     oxygen saturations were monitored continuously. The                            [id number]) scope was introduced through                            the anus and advanced to the the cecum, identified                            by appendiceal orifice and ileocecal valve. The                            colonoscopy was performed without difficulty. The                            patient tolerated the procedure well. The quality                            of the bowel preparation was good. The appendiceal                            orifice and the rectum were photographed. Scope In: 9:43:08 AM Scope Out: 10:00:52 AM Scope Withdrawal Time: 0 hours 11 minutes 16 seconds  Total Procedure Duration: 0 hours 17 minutes 44 seconds  Findings:      The perianal and digital rectal examinations were normal.      A 6 mm polyp was found in the mid descending colon. The polyp was       pedunculated. The polyp was removed with a hot snare. Resection and       retrieval were complete.      A few small-mouthed diverticula were found in the sigmoid colon.      The exam was otherwise without abnormality. Impression:               - One 6 mm polyp in the mid descending colon,                            removed with a hot snare. Resected and retrieved.                           -  Diverticulosis in the sigmoid colon.                           - The examination was otherwise normal. Moderate Sedation:      N/A- Per Anesthesia Care Recommendation:           - Patient has a contact number available for                            emergencies. The signs and symptoms of potential                            delayed complications were discussed with the                            patient. Return to normal activities tomorrow.                            Written discharge instructions were provided to the                            patient.                           - Repeat colonoscopy date to be  determined after                            pending pathology results are reviewed for                            surveillance.                           - Resume previous diet.                           - Continue present medications. Procedure Code(s):        --- Professional ---                           [phone], Colonoscopy, flexible; with removal of                            tumor(s), polyp(s), or other lesion(s) by snare                            technique Diagnosis Code(s):        --- Professional ---                           Z12.11, Encounter for screening for malignant                            neoplasm of colon                           D12.4, Benign neoplasm of descending colon  K57.30, Diverticulosis of large intestine without                            perforation or abscess without bleeding CPT copyright 2016 American Medical Association. All rights reserved. The codes documented in this report are preliminary and upon coder review may  be revised to meet current compliance requirements. Earle Gell, MD Garlan Fair, MD 09/23/2016 10:07:28 AM This report has been signed electronically. Number of Addenda: 0

## 2016-09-23 NOTE — Transfer of Care (Signed)
Immediate Anesthesia Transfer of Care Note  Patient: David Wright  Procedure(s) Performed: Procedure(s): COLONOSCOPY WITH PROPOFOL (N/A)  Patient Location: PACU  Anesthesia Type:MAC  Level of Consciousness:  sedated, patient cooperative and responds to stimulation  Airway & Oxygen Therapy:Patient Spontanous Breathing   Post-op Assessment:  Report given to PACU RN and Post -op Vital signs reviewed and stable  Post vital signs:  Reviewed and stable  Last Vitals:  Vitals:   09/23/16 0911  BP: (!) 155/79  Pulse: (!) 55  Resp: 14  Temp: [id number] C    Complications: No apparent anesthesia complications

## 2016-09-23 NOTE — Anesthesia Postprocedure Evaluation (Signed)
Anesthesia Post Note  Patient: David Wright  Procedure(s) Performed: Procedure(s) (LRB): COLONOSCOPY WITH PROPOFOL (N/A)  Patient location during evaluation: Endoscopy Anesthesia Type: MAC Level of consciousness: awake and alert Pain management: pain level controlled Vital Signs Assessment: post-procedure vital signs reviewed and stable Respiratory status: spontaneous breathing, nonlabored ventilation, respiratory function stable and patient connected to nasal cannula oxygen Cardiovascular status: stable and blood pressure returned to baseline Anesthetic complications: no    Last Vitals:  Vitals:   09/23/16 1008 09/23/16 1010  BP: (!) 73/45 (!) 86/40  Pulse: (!) 56 (!) 54  Resp: 14 16  Temp:      Last Pain:  Vitals:   09/23/16 1007  TempSrc: Oral                 Coley Kulikowski,JAMES TERRILL

## 2016-09-24 ENCOUNTER — Encounter (HOSPITAL_COMMUNITY): Payer: Self-pay | Admitting: Gastroenterology

## 2016-09-26 DIAGNOSIS — K4090 Unilateral inguinal hernia, without obstruction or gangrene, not specified as recurrent: Secondary | ICD-10-CM | POA: Diagnosis not present

## 2016-10-04 DIAGNOSIS — K4090 Unilateral inguinal hernia, without obstruction or gangrene, not specified as recurrent: Secondary | ICD-10-CM | POA: Diagnosis not present

## 2016-10-14 DIAGNOSIS — Z91013 Allergy to seafood: Secondary | ICD-10-CM | POA: Diagnosis not present

## 2016-10-14 DIAGNOSIS — J3089 Other allergic rhinitis: Secondary | ICD-10-CM | POA: Diagnosis not present

## 2016-10-14 DIAGNOSIS — J301 Allergic rhinitis due to pollen: Secondary | ICD-10-CM | POA: Diagnosis not present

## 2016-10-14 DIAGNOSIS — J4530 Mild persistent asthma, uncomplicated: Secondary | ICD-10-CM | POA: Diagnosis not present

## 2016-11-01 ENCOUNTER — Encounter: Payer: Self-pay | Admitting: Internal Medicine

## 2016-11-05 ENCOUNTER — Other Ambulatory Visit: Payer: Self-pay | Admitting: Surgery

## 2016-11-05 DIAGNOSIS — K4090 Unilateral inguinal hernia, without obstruction or gangrene, not specified as recurrent: Secondary | ICD-10-CM | POA: Diagnosis not present

## 2016-11-07 ENCOUNTER — Ambulatory Visit (HOSPITAL_COMMUNITY)
Admission: RE | Admit: 2016-11-07 | Discharge: 2016-11-07 | Disposition: A | Discharge status: Home / Self Care | Payer: Medicare Other | Source: Ambulatory Visit | Attending: Internal Medicine | Admitting: Internal Medicine

## 2016-11-07 ENCOUNTER — Encounter (HOSPITAL_COMMUNITY): Payer: Self-pay | Admitting: Nurse Practitioner

## 2016-11-07 ENCOUNTER — Ambulatory Visit: Payer: TRICARE For Life (TFL) | Admitting: Internal Medicine

## 2016-11-07 VITALS — BP 132/84 | HR 58 | Ht 69.0 in | Wt 167.0 lb

## 2016-11-07 DIAGNOSIS — I471 Supraventricular tachycardia: Secondary | ICD-10-CM | POA: Diagnosis not present

## 2016-11-07 DIAGNOSIS — Z96641 Presence of right artificial hip joint: Secondary | ICD-10-CM | POA: Insufficient documentation

## 2016-11-07 DIAGNOSIS — I491 Atrial premature depolarization: Secondary | ICD-10-CM | POA: Insufficient documentation

## 2016-11-07 DIAGNOSIS — I480 Paroxysmal atrial fibrillation: Secondary | ICD-10-CM

## 2016-11-07 DIAGNOSIS — R001 Bradycardia, unspecified: Secondary | ICD-10-CM | POA: Insufficient documentation

## 2016-11-07 DIAGNOSIS — Z7982 Long term (current) use of aspirin: Secondary | ICD-10-CM | POA: Diagnosis not present

## 2016-11-07 DIAGNOSIS — J45909 Unspecified asthma, uncomplicated: Secondary | ICD-10-CM | POA: Diagnosis not present

## 2016-11-07 DIAGNOSIS — E7800 Pure hypercholesterolemia, unspecified: Secondary | ICD-10-CM | POA: Diagnosis not present

## 2016-11-07 MED ORDER — METOPROLOL TARTRATE 25 MG PO TABS: 12.5000 mg | ORAL_TABLET | Freq: Two times a day (BID) | ORAL | 3 refills | Status: DC

## 2016-11-07 MED ORDER — FLECAINIDE ACETATE 50 MG PO TABS: 50.0000 mg | ORAL_TABLET | Freq: Two times a day (BID) | ORAL | 3 refills | Status: DC

## 2016-11-07 NOTE — Progress Notes (Signed)
Patient ID: David Wright, male   DOB: 1946-02-07, 70 y.o.   MRN: [id number]      Primary Care Physician: Kandice Hams, MD Referring Physician: Dr. Talmadge Chad Fort is a 70 y.o. male with a h/o PAF, atrial tachycardia, PAC's, pt of Dr. Ashok Norris, being seen in  [facility]. He has been on flecainide for several years and usually maintains SR. He developed afib, last October.. He took 100 mg flecainide( usual dose 50 mg) and went to sleep around 11 pm, At 1 am, he woke up and was still in afib and took another 50 mg. Saturday am, he was still in afib and took his usual flecainide and toprol and within a few hours went back into SR. No further afib since then but feels tired. He only reports one other afib breakthrough in September for 3-5 hours that returned to [place] without intervention. He has a chadsvasc score of 1, for age and continues on asa. He does not know of any triggers. Snoring with possible apnea spells but has not had a sleep study. No alcohol or excessive caffeine.  Pt returns to the afib clinic 11/28 and has no further issues with afib , everything has been back to normal. He feels well. He has a chadsvasc score of 1, so he chooses to continue with asa, according to guidelines, this is acceptable. Continues on flecainide. The wife cancelled her neuro appointment so did not mention to her doactor her husbands two memory lapses, but they have not reoccurred.  In afib clinic 2/28, he has only had one further episode of afib , 8 hours last week. He took one extra 50 mg flecainide and converted within several hours. So, overall about 2-3 breakthrough episodes over one year on flecainide which is very reasonable. The pt would like appointment with Dr. Rayann Heman within the next few months to establish in case afib burden worsens and at that point, he would like to be considered for ablation.  F/u in afib clinc 12/14. He feels well. Very low afib burden. Continues on flecainide.On asa with  chadsvasc score of 1(age)  Today, he denies symptoms of palpitations, chest pain, shortness of breath, orthopnea, PND, lower extremity edema, dizziness, presyncope, syncope, or neurologic sequela. The patient is tolerating medications without difficulties and is otherwise without complaint today.   Past Medical History:  Diagnosis Date  . Anginal pain ([place])    has been worked-up but all neg.  . Arthritis   . Asthma   . Atrial tachycardia, paroxysmal ([place])   . Complication of anesthesia 40 yrs.ago   spinal headache  . Dysrhythmia    A-Fibrillation  . Headache   . Hypercholesteremia    pt reports that his cholesterol is normal  . PAC (premature atrial contraction)   . Paroxysmal atrial fibrillation ([facility])   . Snores    Past Surgical History:  Procedure Laterality Date  . COLONOSCOPY WITH PROPOFOL N/A 09/23/2016   Procedure: COLONOSCOPY WITH PROPOFOL;  Surgeon: Garlan Fair, MD;  Location: [facility];  Service: Endoscopy;  Laterality: N/A;  . EYE SURGERY     age 20  . JOINT REPLACEMENT    . perianal fistula     had spinal anesthesia and had spinal headache after surgery  . TONSILLECTOMY    . TOTAL HIP ARTHROPLASTY  10/27/2012   RIGHT HIP    Current Outpatient Prescriptions  Medication Sig Dispense Refill  . aspirin EC 81 MG tablet Take 81 mg by mouth  daily.    . flecainide (TAMBOCOR) 50 MG tablet Take 1 tablet (50 mg total) by mouth 2 (two) times daily. 180 tablet 3  . fluticasone (FLONASE) 50 MCG/ACT nasal spray Place 2 sprays into the nose daily.    . Fluticasone-Salmeterol (ADVAIR DISKUS) 100-50 MCG/DOSE AEPB Inhale 1 puff into the lungs daily.    . metoprolol tartrate (LOPRESSOR) 25 MG tablet Take 0.5 tablets (12.5 mg total) by mouth 2 (two) times daily. 90 tablet 3   No current facility-administered medications for this encounter.     No Known Allergies  Social History   Social History  . Marital status: Married    Spouse name: N/A  . Number of children:  N/A  . Years of education: N/A   Occupational History  . Not on file.   Social History Main Topics  . Smoking status: Never Smoker  . Smokeless tobacco: Never Used  . Alcohol use 0.0 oz/week     Comment: occasionally a drink per week or less  . Drug use: No  . Sexual activity: Not on file   Other Topics Concern  . Not on file   Social History Narrative   Pt lives in [place] with spouse.   Retired [employer]       Family History  Problem Relation Age of Onset  . Heart attack Mother   . Heart disease Mother   . Heart attack Father   . Heart disease Father   . Pancreatic cancer Sister     ROS- All systems are reviewed and negative except as per the HPI above  Physical Exam: Vitals:   11/07/16 1426  BP: 132/84  Pulse: (!) 58  Weight: 167 lb (75.8 kg)  Height: 5\' 9"  (1.753 m)    GEN- The patient is well appearing, alert and oriented x 3 today.   Head- normocephalic, atraumatic Eyes-  Sclera clear, conjunctiva pink Ears- hearing intact Oropharynx- clear Neck- supple, no JVP Lymph- no cervical lymphadenopathy Lungs- Clear to ausculation bilaterally, normal work of breathing Heart- Regular rate and rhythm, no murmurs, rubs or gallops, PMI not laterally displaced GI- soft, NT, ND, + BS Extremities- no clubbing, cyanosis, or edema MS- no significant deformity or atrophy Skin- no rash or lesion Psych- euthymic mood, full affect Neuro- strength and sensation are intact  EKG-Sinus brady @ 58 bpm, pr int 164 ms, qrs int 84 ms, qtc 420 ms Epic records reviewed Echo- 2013, LVF normal, left atrium normal  Assessment and Plan: 1.PAF with low afib burden Continue flecainide/ metoprolol  2. Chadsvasc score of 1(age) Continue on asa.  F/u afib clinic  In 6 months  Geroge Baseman. Danila Eddie, [facility] [address] [place],  [address] [phone]

## 2016-11-27 NOTE — Pre-Procedure Instructions (Signed)
David Wright  11/27/2016      [id number] [place], [place] [id number] [place] [id number] Phone: [phone] Fax: [phone]  [place], [place] [address] [place] [address] Phone: [phone] Fax: [phone]  [place], [place] [address] Phone: [phone] Fax: [phone]  [facility] [place], [address] [facility] [address] AT [facility] [address] [place] [address] [place] [address] Phone: [phone] Fax: [phone]    Your procedure is scheduled on Wednesday January 10.  Report to [facility] Admitting at 9:45 A.M.  Call this number if you have problems the morning of surgery:  [phone]   Remember:  Do not eat food or drink liquids after midnight.  Take these medicines the morning of surgery with A SIP OF WATER: flecainide (tambocor), metoprolol (lopressor), Advair  7 days prior to surgery STOP taking any Aspirin, Aleve, Naproxen, Ibuprofen, Motrin, Advil, Goody's, BC's, all herbal medications, fish oil, and all vitamins    Do not wear jewelry, make-up or nail polish.  Do not wear lotions, powders, or perfumes, or deoderant.  Do not shave 48 hours prior to surgery.  Men may shave face and neck.  Do not bring valuables to the hospital.  [facility] is not responsible for any belongings or valuables.  Contacts, dentures or bridgework may not be worn into surgery.  Leave your suitcase in the car.  After surgery it may be brought to your room.  For patients admitted to the hospital, discharge time will be determined by your treatment team.  Patients discharged the day of surgery will not be allowed to drive home.  Special instructions:    [facility]- Preparing For Surgery  Before  surgery, you can play an important role. Because skin is not sterile, your skin needs to be as free of germs as possible. You can reduce the number of germs on your skin by washing with CHG (chlorahexidine gluconate) Soap before surgery.  CHG is an antiseptic cleaner which kills germs and bonds with the skin to continue killing germs even after washing.  Please do not use if you have an allergy to CHG or antibacterial soaps. If your skin becomes reddened/irritated stop using the CHG.  Do not shave (including legs and underarms) for at least 48 hours prior to first CHG shower. It is OK to shave your face.  Please follow these instructions carefully.   1. Shower the NIGHT BEFORE SURGERY and the MORNING OF SURGERY with CHG.   2. If you chose to wash your hair, wash your hair first as usual with your normal shampoo.  3. After you shampoo, rinse your hair and body thoroughly to remove the shampoo.  4. Use CHG as you would any other liquid soap. You can apply CHG directly to the skin and wash gently with a scrungie or a clean washcloth.   5. Apply the CHG Soap to your body ONLY FROM THE NECK DOWN.  Do not use on open wounds or open sores. Avoid contact with your eyes, ears, mouth and genitals (private parts). Wash genitals (private parts) with your normal soap.  6. Wash thoroughly, paying special attention to the area where your surgery will be performed.  7. Thoroughly rinse your  body with warm water from the neck down.  8. DO NOT shower/wash with your normal soap after using and rinsing off the CHG Soap.  9. Pat yourself dry with a CLEAN TOWEL.   10. Wear CLEAN PAJAMAS   11. Place CLEAN SHEETS on your bed the night of your first shower and DO NOT SLEEP WITH PETS.    Day of Surgery: Do not apply any deodorants/lotions. Please wear clean clothes to the hospital/surgery center.

## 2016-11-28 ENCOUNTER — Encounter (HOSPITAL_COMMUNITY): Payer: Self-pay

## 2016-11-28 ENCOUNTER — Encounter (HOSPITAL_COMMUNITY)
Admission: RE | Admit: 2016-11-28 | Discharge: 2016-11-28 | Disposition: A | Discharge status: Home / Self Care | Payer: Medicare Other | Source: Ambulatory Visit | Attending: Surgery | Admitting: Surgery

## 2016-11-28 DIAGNOSIS — Z01818 Encounter for other preprocedural examination: Secondary | ICD-10-CM | POA: Insufficient documentation

## 2016-11-28 DIAGNOSIS — K4090 Unilateral inguinal hernia, without obstruction or gangrene, not specified as recurrent: Secondary | ICD-10-CM | POA: Diagnosis not present

## 2016-11-28 HISTORY — DX: Other reaction to spinal and lumbar puncture: G97.1

## 2016-11-28 LAB — CBC
HCT: 46.4 % (ref 39.0–52.0)
Hemoglobin: 16.1 g/dL (ref 13.0–17.0)
MCH: 32.2 pg (ref 26.0–34.0)
MCHC: 34.7 g/dL (ref 30.0–36.0)
MCV: 92.8 fL (ref 78.0–100.0)
PLATELETS: 224 10*3/uL (ref 150–400)
RBC: 5 MIL/uL (ref 4.22–5.81)
RDW: 12.9 % (ref 11.5–15.5)
WBC: 5.9 10*3/uL (ref 4.0–10.5)

## 2016-11-28 LAB — BASIC METABOLIC PANEL
Anion gap: 7 (ref 5–15)
BUN: 11 mg/dL (ref 6–20)
CHLORIDE: 106 mmol/L (ref 101–111)
CO2: 25 mmol/L (ref 22–32)
Calcium: 9.4 mg/dL (ref 8.9–10.3)
Creatinine, Ser: 1.03 mg/dL (ref 0.61–1.24)
GFR calc Af Amer: >60 mL/min (ref >60)
GFR calc non Af Amer: >60 mL/min (ref >60)
Glucose, Bld: 101 mg/dL — ABNORMAL HIGH (ref 65–99)
Potassium: 4.2 mmol/L (ref 3.5–5.1)
Sodium: 138 mmol/L (ref 135–145)

## 2016-11-28 MED ORDER — CHLORHEXIDINE GLUCONATE CLOTH 2 % EX PADS: 6.0000 | MEDICATED_PAD | Freq: Once | CUTANEOUS | Status: DC

## 2016-11-28 NOTE — Progress Notes (Addendum)
PCP: Seward Carol Cardiologist: pt sees Laroy Apple at the [facility] clinic for Afib, has seen Dr. Rayann Heman as well. Pt reports stress test  4-5 years ago for left arm numbness. No issues with this since then.   ECHO: 05/16/16 Stress test:Pt states everything was fine. 2013 with Dr. Radford Pax at [facility]- report in epic under media tab EKG: 11/07/16

## 2016-11-29 NOTE — Progress Notes (Signed)
Anesthesia chart review: Patient is a 71 year old male scheduled for laparoscopic right inguinal hernia repair with mesh on 12/04/2016 by Dr. Coralie Keens.   History includes nonsmoker, spinal headache, paroxysmal atrial fibrillation and atrial tachycardia, asthma, chest pain '13 (negative ETT), right THA 2013, perianal fistula surgery, tonsillectomy, snoring, hypercholesterolemia (denied).   PCP is Dr. Cecile Sheerer. Cardiologist is Dr. Aggie Cosier, NP ([facility]). Last visit 11/07/16. He was referred by Dr. Fransico Him. Six month follow-up recommended. CHADSVASC score of 1, so only ASA for anti-coagulation therapy.   Meds include aspirin 81 mg, flecainide, Flonase, Advair, Lopressor.  BP 133/72   Pulse (!) 55   Temp 36.7 C   Resp 20   Ht 5\' 9"  (1.753 m)   Wt 168 lb 14.4 oz (76.6 kg)   SpO2 97%   BMI 24.94 kg/m   EKG 11/07/16: Sinus bradycardia at 58 bpm.  Echo 05/16/16: Impressions: - Normal LV size with EF 55%. Mild LV hypertrophy. Normal RV size and systolic function. No significant valvular abnormalities. 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  ETT 06/08/12: Target heart rate achieved. No symptoms. No ectopy. No EKG changes to suggest ischemia.  Preoperative labs noted.  If no acute changes then I anticipate that he can proceed as planned.  [facility]/Anesthesiology Phone [phone] 11/29/2016 5:23 PM

## 2016-12-03 NOTE — H&P (Signed)
David Wright 11/05/2016 9:17 AM Location: [facility] Patient #: [id number] DOB: 07/08/1946 Married / Language: English / Race: White Male   History of Present Illness David Wright A. Ninfa Linden MD; 11/05/2016 9:37 AM) The patient is a 71 year old male who presents with an incisional hernia. This gentleman wanted a second opinion regarding laparoscopic versus open inguinal hernia repair. He is really interested in having hernia fixed laparoscopically because he knows several people that have had it done this way including patients I have operated on. His right inguinal hernia is getting larger and causing him have more symptoms of discomfort but remains easily reducible.   Allergies Nance Pear, [place]; 11/05/2016 9:17 AM) No Known Drug Allergies 10/04/2016  Medication History Nance Pear, [place]; 11/05/2016 9:17 AM) Advair Diskus (100-50MCG/DOSE Aero Pow Br Act, Inhalation) Active. Flecainide Acetate (50MG  Tablet, Oral) Active. Fluticasone Propionate (50MCG/ACT Suspension, Nasal) Active. Metoprolol Tartrate (25MG  Tablet, Oral) Active. Medications Reconciled Other Problems [name], CMA; 10/04/2016 9:23 AM) Asthma  Atrial Fibrillation   Past Surgical History Anal Fissure Repair  Colon Polyp Removal - Open  Hip Surgery  Right.  Diagnostic Studies History Colonoscopy  within last year  Allergies  No Known Drug Allergies     Social History  Alcohol use  Occasional alcohol use. Caffeine use  Carbonated beverages, Coffee. No drug use  Tobacco use  Never smoker.  Family History  Arthritis  Mother. Heart Disease  Father. Malignant Neoplasm Of Pancreas  Sister.    Review of Systems General Not Present- Appetite Loss, Chills, Fatigue, Fever, Night Sweats, Weight Gain and Weight Loss. Skin Not Present- Change in Wart/Mole, Dryness, Hives, Jaundice, New Lesions, Non-Healing Wounds, Rash and Ulcer. HEENT Present- Ringing in the Ears and Wears  glasses/contact lenses. Not Present- Earache, Hearing Loss, Hoarseness, Nose Bleed, Oral Ulcers, Seasonal Allergies, Sinus Pain, Sore Throat, Visual Disturbances and Yellow Eyes. Respiratory Present- Snoring. Not Present- Bloody sputum, Chronic Cough, Difficulty Breathing and Wheezing. Breast Not Present- Breast Mass, Breast Pain, Nipple Discharge and Skin Changes. Cardiovascular Not Present- Chest Pain, Difficulty Breathing Lying Down, Leg Cramps, Palpitations, Rapid Heart Rate, Shortness of Breath and Swelling of Extremities. Gastrointestinal Not Present- Abdominal Pain, Bloating, Bloody Stool, Change in Bowel Habits, Chronic diarrhea, Constipation, Difficulty Swallowing, Excessive gas, Gets full quickly at meals, Hemorrhoids, Indigestion, Nausea, Rectal Pain and Vomiting. Male Genitourinary Not Present- Blood in Urine, Change in Urinary Stream, Frequency, Impotence, Nocturia, Painful Urination, Urgency and Urine Leakage. Musculoskeletal Present- Joint Stiffness. Not Present- Back Pain, Joint Pain, Muscle Pain, Muscle Weakness and Swelling of Extremities. Neurological Present- Headaches. Not Present- Decreased Memory, Fainting, Numbness, Seizures, Tingling, Tremor, Trouble walking and Weakness. Psychiatric Not Present- Anxiety, Bipolar, Change in Sleep Pattern, Depression, Fearful and Frequent crying. Endocrine Not Present- Cold Intolerance, Excessive Hunger, Hair Changes, Heat Intolerance, Hot flashes and New Diabetes. Hematology Not Present- Blood Thinners, Easy Bruising, Excessive bleeding, Gland problems, HIV and Persistent Infections.   Vitals [name] CMA; 11/05/2016 9:18 AM) 11/05/2016 9:17 AM Weight: 166.6 lb Height: 69in Body Surface Area: 1.91 m Body Mass Index: 24.6 kg/m  Temp.: 98.24F  Pulse: 53 (Regular)  BP: 120/64 (Sitting, Left Arm, Standard)     Physical Exam (Izaiyah Kleinman A. Ninfa Linden MD; 11/05/2016 9:37 AM) The physical exam findings are as  follows: Note:Again on exam, he has a moderate-sized right inguinal hernia which is easily reducible. There is no evidence of left inguinal hernia or umbilical hernia Lungs clear CV RRR Skin without rashes Neuro grossly intact Generally well in appearance  Assessment & Plan ([name] A. Ninfa Linden MD; 11/05/2016 9:38 AM)  RIGHT INGUINAL HERNIA (Principal Diagnosis) (K40.90)  Impression: I again had a long discussion with him regarding laparoscopic versus open inguinal hernia repair. I believe it is reasonable to do the right inguinal hernia laparoscopic if it is his desire. I again discussed risk of surgery which includes but is not limited to bleeding, infection, nerve entrapment, chronic pain, recurrence, etc. I also discussed postoperative recovery. He understands and wishes to proceed with a laparoscopic right inguinal hernia repair with mesh

## 2016-12-04 ENCOUNTER — Ambulatory Visit (HOSPITAL_COMMUNITY): Payer: Medicare Other | Admitting: Vascular Surgery

## 2016-12-04 ENCOUNTER — Encounter (HOSPITAL_COMMUNITY): Payer: Self-pay | Admitting: Surgery

## 2016-12-04 ENCOUNTER — Encounter (HOSPITAL_COMMUNITY)
Admission: RE | Disposition: A | Discharge status: Home / Self Care | Payer: Self-pay | Source: Ambulatory Visit | Attending: Surgery

## 2016-12-04 ENCOUNTER — Ambulatory Visit (HOSPITAL_COMMUNITY): Payer: Medicare Other | Admitting: Critical Care Medicine

## 2016-12-04 ENCOUNTER — Ambulatory Visit (HOSPITAL_COMMUNITY)
Admission: RE | Admit: 2016-12-04 | Discharge: 2016-12-04 | Disposition: A | Discharge status: Home / Self Care | Payer: Medicare Other | Source: Ambulatory Visit | Attending: Surgery | Admitting: Surgery

## 2016-12-04 DIAGNOSIS — Z79899 Other long term (current) drug therapy: Secondary | ICD-10-CM | POA: Insufficient documentation

## 2016-12-04 DIAGNOSIS — J45909 Unspecified asthma, uncomplicated: Secondary | ICD-10-CM | POA: Insufficient documentation

## 2016-12-04 DIAGNOSIS — D649 Anemia, unspecified: Secondary | ICD-10-CM | POA: Diagnosis not present

## 2016-12-04 DIAGNOSIS — K4090 Unilateral inguinal hernia, without obstruction or gangrene, not specified as recurrent: Secondary | ICD-10-CM | POA: Insufficient documentation

## 2016-12-04 DIAGNOSIS — Z7951 Long term (current) use of inhaled steroids: Secondary | ICD-10-CM | POA: Diagnosis not present

## 2016-12-04 DIAGNOSIS — I4891 Unspecified atrial fibrillation: Secondary | ICD-10-CM | POA: Diagnosis not present

## 2016-12-04 DIAGNOSIS — K432 Incisional hernia without obstruction or gangrene: Secondary | ICD-10-CM | POA: Diagnosis present

## 2016-12-04 HISTORY — PX: INGUINAL HERNIA REPAIR: SHX194

## 2016-12-04 HISTORY — PX: INSERTION OF MESH: SHX5868

## 2016-12-04 SURGERY — REPAIR, HERNIA, INGUINAL, LAPAROSCOPIC
Anesthesia: General | Site: Groin | Laterality: Right

## 2016-12-04 MED ORDER — CEFAZOLIN SODIUM-DEXTROSE 2-4 GM/100ML-% IV SOLN
2.0000 g | INTRAVENOUS | Status: AC
  Administered 2016-12-04: 2 g via INTRAVENOUS
  Filled 2016-12-04: qty 100

## 2016-12-04 MED ORDER — FENTANYL CITRATE (PF) 100 MCG/2ML IJ SOLN
INTRAMUSCULAR | Status: AC
  Filled 2016-12-04: qty 4

## 2016-12-04 MED ORDER — ONDANSETRON HCL 4 MG/2ML IJ SOLN: 4.0000 mg | Freq: Four times a day (QID) | INTRAMUSCULAR | Status: DC | PRN

## 2016-12-04 MED ORDER — OXYCODONE HCL 5 MG PO TABS: 5.0000 mg | ORAL_TABLET | Freq: Once | ORAL | Status: DC | PRN

## 2016-12-04 MED ORDER — MIDAZOLAM HCL 5 MG/5ML IJ SOLN
INTRAMUSCULAR | Status: DC | PRN
  Administered 2016-12-04: 2 mg via INTRAVENOUS

## 2016-12-04 MED ORDER — LIDOCAINE HCL (CARDIAC) 20 MG/ML IV SOLN
INTRAVENOUS | Status: DC | PRN
  Administered 2016-12-04: 60 mg via INTRAVENOUS

## 2016-12-04 MED ORDER — OXYCODONE HCL 5 MG/5ML PO SOLN: 5.0000 mg | Freq: Once | ORAL | Status: DC | PRN

## 2016-12-04 MED ORDER — PROPOFOL 10 MG/ML IV BOLUS
INTRAVENOUS | Status: AC
  Filled 2016-12-04: qty 20

## 2016-12-04 MED ORDER — DEXAMETHASONE SODIUM PHOSPHATE 10 MG/ML IJ SOLN
INTRAMUSCULAR | Status: DC | PRN
  Administered 2016-12-04: 10 mg via INTRAVENOUS

## 2016-12-04 MED ORDER — FENTANYL CITRATE (PF) 100 MCG/2ML IJ SOLN
INTRAMUSCULAR | Status: DC | PRN
  Administered 2016-12-04: 150 ug via INTRAVENOUS
  Administered 2016-12-04: 50 ug via INTRAVENOUS

## 2016-12-04 MED ORDER — ROCURONIUM BROMIDE 100 MG/10ML IV SOLN
INTRAVENOUS | Status: DC | PRN
  Administered 2016-12-04: 30 mg via INTRAVENOUS

## 2016-12-04 MED ORDER — LACTATED RINGERS IV SOLN: INTRAVENOUS | Status: DC

## 2016-12-04 MED ORDER — BUPIVACAINE HCL (PF) 0.25 % IJ SOLN
INTRAMUSCULAR | Status: AC
  Filled 2016-12-04: qty 30

## 2016-12-04 MED ORDER — MIDAZOLAM HCL 2 MG/2ML IJ SOLN
INTRAMUSCULAR | Status: AC
  Filled 2016-12-04: qty 2

## 2016-12-04 MED ORDER — LACTATED RINGERS IV SOLN
INTRAVENOUS | Status: DC | PRN
  Administered 2016-12-04: 10:00:00 via INTRAVENOUS

## 2016-12-04 MED ORDER — SUGAMMADEX SODIUM 200 MG/2ML IV SOLN
INTRAVENOUS | Status: DC | PRN
  Administered 2016-12-04: 200 mg via INTRAVENOUS

## 2016-12-04 MED ORDER — ONDANSETRON HCL 4 MG/2ML IJ SOLN
INTRAMUSCULAR | Status: DC | PRN
  Administered 2016-12-04: 4 mg via INTRAVENOUS

## 2016-12-04 MED ORDER — DEXAMETHASONE SODIUM PHOSPHATE 10 MG/ML IJ SOLN
INTRAMUSCULAR | Status: AC
  Filled 2016-12-04: qty 1

## 2016-12-04 MED ORDER — EPHEDRINE 5 MG/ML INJ
INTRAVENOUS | Status: AC
  Filled 2016-12-04: qty 10

## 2016-12-04 MED ORDER — HYDROMORPHONE HCL 1 MG/ML IJ SOLN
INTRAMUSCULAR | Status: AC
  Administered 2016-12-04: 0.25 mg via INTRAVENOUS
  Filled 2016-12-04: qty 1

## 2016-12-04 MED ORDER — HYDROCODONE-ACETAMINOPHEN 5-325 MG PO TABS: 1.0000 | ORAL_TABLET | ORAL | 0 refills | Status: DC | PRN

## 2016-12-04 MED ORDER — PROPOFOL 10 MG/ML IV BOLUS
INTRAVENOUS | Status: DC | PRN
  Administered 2016-12-04: 150 mg via INTRAVENOUS

## 2016-12-04 MED ORDER — 0.9 % SODIUM CHLORIDE (POUR BTL) OPTIME
TOPICAL | Status: DC | PRN
  Administered 2016-12-04: 1000 mL

## 2016-12-04 MED ORDER — EPHEDRINE SULFATE 50 MG/ML IJ SOLN
INTRAMUSCULAR | Status: DC | PRN
  Administered 2016-12-04: 5 mg via INTRAVENOUS

## 2016-12-04 MED ORDER — HYDROMORPHONE HCL 1 MG/ML IJ SOLN
0.2500 mg | INTRAMUSCULAR | Status: DC | PRN
  Administered 2016-12-04: 0.25 mg via INTRAVENOUS
  Administered 2016-12-04: 0.5 mg via INTRAVENOUS
  Administered 2016-12-04: 0.25 mg via INTRAVENOUS

## 2016-12-04 MED ORDER — LACTATED RINGERS IV SOLN
INTRAVENOUS | Status: DC
  Administered 2016-12-04: 10:00:00 via INTRAVENOUS

## 2016-12-04 MED ORDER — ONDANSETRON HCL 4 MG/2ML IJ SOLN
INTRAMUSCULAR | Status: AC
  Filled 2016-12-04: qty 2

## 2016-12-04 MED ORDER — BUPIVACAINE HCL (PF) 0.25 % IJ SOLN
INTRAMUSCULAR | Status: DC | PRN
  Administered 2016-12-04: 20 mL

## 2016-12-04 SURGICAL SUPPLY — 32 items
APPLIER CLIP LOGIC TI 5 (MISCELLANEOUS) IMPLANT
BLADE SURG ROTATE 9660 (MISCELLANEOUS) IMPLANT
CANISTER SUCTION 2500CC (MISCELLANEOUS) IMPLANT
CHLORAPREP W/TINT 26ML (MISCELLANEOUS) ×2 IMPLANT
COVER SURGICAL LIGHT HANDLE (MISCELLANEOUS) ×2 IMPLANT
DERMABOND ADVANCED (GAUZE/BANDAGES/DRESSINGS) ×1
DERMABOND ADVANCED .7 DNX12 (GAUZE/BANDAGES/DRESSINGS) ×1 IMPLANT
DEVICE SECURE STRAP 25 ABSORB (INSTRUMENTS) ×2 IMPLANT
DISSECT BALLN SPACEMKR + OVL (BALLOONS) ×2
DISSECTOR BALLN SPACEMKR + OVL (BALLOONS) ×1 IMPLANT
DISSECTOR BLUNT TIP ENDO 5MM (MISCELLANEOUS) IMPLANT
DRAPE LAPAROSCOPIC ABDOMINAL (DRAPES) ×2 IMPLANT
ELECT REM PT RETURN 9FT ADLT (ELECTROSURGICAL) ×2
ELECTRODE REM PT RTRN 9FT ADLT (ELECTROSURGICAL) ×1 IMPLANT
GLOVE SURG SIGNA 7.5 PF LTX (GLOVE) ×2 IMPLANT
GOWN STRL REUS W/ TWL LRG LVL3 (GOWN DISPOSABLE) ×2 IMPLANT
GOWN STRL REUS W/ TWL XL LVL3 (GOWN DISPOSABLE) ×1 IMPLANT
GOWN STRL REUS W/TWL LRG LVL3 (GOWN DISPOSABLE) ×2
GOWN STRL REUS W/TWL XL LVL3 (GOWN DISPOSABLE) ×1
KIT BASIN OR (CUSTOM PROCEDURE TRAY) ×2 IMPLANT
KIT ROOM TURNOVER OR (KITS) ×2 IMPLANT
MESH 3DMAX 4X6 RT LRG (Mesh General) ×2 IMPLANT
NS IRRIG 1000ML POUR BTL (IV SOLUTION) ×2 IMPLANT
PAD ARMBOARD 7.5X6 YLW CONV (MISCELLANEOUS) ×2 IMPLANT
SET IRRIG TUBING LAPAROSCOPIC (IRRIGATION / IRRIGATOR) IMPLANT
SET TROCAR LAP APPLE-HUNT 5MM (ENDOMECHANICALS) ×2 IMPLANT
SUT MNCRL AB 4-0 PS2 18 (SUTURE) ×2 IMPLANT
TOWEL OR 17X24 6PK STRL BLUE (TOWEL DISPOSABLE) ×2 IMPLANT
TOWEL OR 17X26 10 PK STRL BLUE (TOWEL DISPOSABLE) ×2 IMPLANT
TRAY FOLEY CATH 16FR SILVER (SET/KITS/TRAYS/PACK) ×2 IMPLANT
TRAY LAPAROSCOPIC MC (CUSTOM PROCEDURE TRAY) ×2 IMPLANT
TUBING INSUFFLATION (TUBING) ×2 IMPLANT

## 2016-12-04 NOTE — Anesthesia Procedure Notes (Signed)
Procedure Name: Intubation Date/Time: 12/04/2016 10:55 AM Performed by: Rebekah Chesterfield L Pre-anesthesia Checklist: Patient identified, Emergency Drugs available, Suction available, Patient being monitored and Timeout performed Patient Re-evaluated:Patient Re-evaluated prior to inductionOxygen Delivery Method: Circle System Utilized Preoxygenation: Pre-oxygenation with 100% oxygen Intubation Type: IV induction Ventilation: Mask ventilation without difficulty Laryngoscope Size: Mac and 4 Grade View: Grade II Tube type: Oral Tube size: 7.5 mm Number of attempts: 1 Airway Equipment and Method: Stylet and Oral airway Placement Confirmation: ETT inserted through vocal cords under direct vision,  positive ETCO2 and breath sounds checked- equal and bilateral Secured at: 22 cm Tube secured with: Tape Dental Injury: Teeth and Oropharynx as per pre-operative assessment

## 2016-12-04 NOTE — Discharge Instructions (Signed)
[facility]  UMBILICAL OR INGUINAL HERNIA REPAIR: POST OP INSTRUCTIONS  Always review your discharge instruction sheet given to you by the facility where your surgery was performed. IF YOU HAVE DISABILITY OR FAMILY LEAVE FORMS, YOU MUST BRING THEM TO THE OFFICE FOR PROCESSING.   DO NOT GIVE THEM TO YOUR DOCTOR.  1. A  prescription for pain medication may be given to you upon discharge.  Take your pain medication as prescribed, if needed.  If narcotic pain medicine is not needed, then you may take acetaminophen (Tylenol) or ibuprofen (Advil) as needed. 2. Take your usually prescribed medications unless otherwise directed. If you need a refill on your pain medication, please contact your pharmacy.  They will contact our office to request authorization. Prescriptions will not be filled after 5 pm or on week-ends. 3. You should follow a light diet the first 24 hours after arrival home, such as soup and crackers, etc.  Be sure to include lots of fluids daily.  Resume your normal diet the day after surgery. 4.Most patients will experience some swelling and bruising around the umbilicus or in the groin and scrotum.  Ice packs and reclining will help.  Swelling and bruising can take several days to resolve.  6. It is common to experience some constipation if taking pain medication after surgery.  Increasing fluid intake and taking a stool softener (such as Colace) will usually help or prevent this problem from occurring.  A mild laxative (Milk of Magnesia or Miralax) should be taken according to package directions if there are no bowel movements after 48 hours. 7. Unless discharge instructions indicate otherwise, you may remove your bandages 24-48 hours after surgery, and you may shower at that time.  You may have steri-strips (small skin tapes) in place directly over the incision.  These strips should be left on the skin for 7-10 days.  If your surgeon used skin glue on the  incision, you may shower in 24 hours.  The glue will flake off over the next 2-3 weeks.  Any sutures or staples will be removed at the office during your follow-up visit. 8. ACTIVITIES:  You may resume regular (light) daily activities beginning the next day--such as daily self-care, walking, climbing stairs--gradually increasing activities as tolerated.  You may have sexual intercourse when it is comfortable.  Refrain from any heavy lifting or straining until approved by your doctor.  a.You may drive when you are no longer taking prescription pain medication, you can comfortably wear a seatbelt, and you can safely maneuver your car and apply brakes. b.RETURN TO WORK:   _____________________________________________  9.You should see your doctor in the office for a follow-up appointment approximately 2-3 weeks after your surgery.  Make sure that you call for this appointment within a day or two after you arrive home to insure a convenient appointment time. 10.OTHER INSTRUCTIONS: ___NO LIFTING MORE THAN 15 POUNDS FOR 3 WEEK OK TO SHOWER TOMORROW ICE PACK AND IBUPROFEN ALSO FOR PAIN______________________    _____________________________________  WHEN TO CALL YOUR DOCTOR: 1. Fever over 101.0 2. Inability to urinate 3. Nausea and/or vomiting 4. Extreme swelling or bruising 5. Continued bleeding from incision. 6. Increased pain, redness, or drainage from the incision  The clinic staff is available to answer your questions during regular business hours.  Please dont hesitate to call and ask to speak to one of the nurses for clinical concerns.  If you have a medical emergency, go to the nearest emergency room  or call 911.  A surgeon from [facility] is always on call at the hospital   [address], [place], [place], [place]  [address] ?  [address], [place], [place]   [address] [phone] ? [phone] ? FAX [phone] Web site: [email or web address]

## 2016-12-04 NOTE — Anesthesia Preprocedure Evaluation (Signed)
Anesthesia Evaluation  Patient identified by MRN, date of birth, ID band Patient awake    Reviewed: Allergy & Precautions, H&P , NPO status , Patient's Chart, lab work & pertinent test results  History of Anesthesia Complications (+) POST - OP SPINAL HEADACHE and history of anesthetic complications  Airway Mallampati: II   Neck ROM: full    Dental   Pulmonary asthma ,    breath sounds clear to auscultation       Cardiovascular + angina + dysrhythmias Atrial Fibrillation  Rhythm:regular Rate:Normal     Neuro/Psych  Headaches,    GI/Hepatic   Endo/Other    Renal/GU      Musculoskeletal  (+) Arthritis ,   Abdominal   Peds  Hematology  (+) anemia ,   Anesthesia Other Findings   Reproductive/Obstetrics                             Anesthesia Physical Anesthesia Plan  ASA: II  Anesthesia Plan: General   Post-op Pain Management:    Induction: Intravenous  Airway Management Planned: Oral ETT  Additional Equipment:   Intra-op Plan:   Post-operative Plan: Extubation in OR  Informed Consent: I have reviewed the patients History and Physical, chart, labs and discussed the procedure including the risks, benefits and alternatives for the proposed anesthesia with the patient or authorized representative who has indicated his/her understanding and acceptance.     Plan Discussed with: CRNA, Anesthesiologist and Surgeon  Anesthesia Plan Comments:         Anesthesia Quick Evaluation

## 2016-12-04 NOTE — Anesthesia Postprocedure Evaluation (Signed)
Anesthesia Post Note  Patient: David Wright  Procedure(s) Performed: Procedure(s) (LRB): LAPAROSCOPIC RIGHT INGUINAL HERNIA WITH MESH (Right) INSERTION OF MESH (Right)  Patient location during evaluation: PACU Anesthesia Type: General Level of consciousness: awake and alert and patient cooperative Pain management: pain level controlled Vital Signs Assessment: post-procedure vital signs reviewed and stable Respiratory status: spontaneous breathing and respiratory function stable Cardiovascular status: stable Anesthetic complications: no       Last Vitals:  Vitals:   12/04/16 1133 12/04/16 1147  BP: 110/65 108/63  Pulse: (!) 57 (!) 52  Resp: 10 10  Temp: 36.4 C     Last Pain:  Vitals:   12/04/16 0924  TempSrc: Oral                 Shahid Flori S

## 2016-12-04 NOTE — Op Note (Signed)
LAPAROSCOPIC RIGHT INGUINAL HERNIA WITH MESH, INSERTION OF MESH  Procedure Note  David Wright 12/04/2016   Pre-op Diagnosis: RIGHT INGUINAL HERNIA     Post-op Diagnosis: same  Procedure(s): LAPAROSCOPIC RIGHT INGUINAL HERNIA WITH MESH INSERTION OF MESH  Surgeon(s): Coralie Keens, MD  Anesthesia: General  Staff:  Circulator: Rosanne Sack, RN Scrub Person: Susann Givens, RN; Jim Like Leggio Circulator Assistant: Susann Givens, RN  Estimated Blood Loss: Minimal               Findings: Patient had a moderate sized indirect right inguinal hernia.  It was repaired with a piece of Prolene Bard 3-D max mesh  Procedure: The patient was brought to the operating room and identified as the correct patient. He was placed supine on the operating room table and general anesthesia was induced. His abdomen was then prepped and draped in the usual sterile fashion. I made a small incision just below the umbilicus with a scalpel. I carried this down to the fascia which was just opened to the right of the midline. I elevated the rectus muscle. I then passed the dissecting balloon underneath the rectus muscle toward the pubis. The dissecting balloon was then insufflated under direct vision dissecting out the preperitoneal space. The dissecting balloon was then removed and insufflation was begun with carbon dioxide. I then placed two 5 mm trochars in the patient's lower midline both under direct vision. I evaluated the right inguinal area. The patient had an indirect hernia sac. The right inguinal floor appeared slightly weakened. I was able to easily defy Cooper's ligament. I was able to reduce the hernia sac from the cord structures of fairly easily. I then looked at the left inguinal area and saw no evidence of hernia there. Next a piece of Bard Prolene 3-D max mesh is brought to the field. I placed it through the umbilical trocar and open as an on lay on the right inguinal floor. I then  tacked to Cooper's ligament of the medial abdominal wall is slightly laterally with the absorbable tacker. Good coverage of the abdominal wall and cord structures appear to be achieved. Hemostasis also appeared to be achieved. I then removed the midline trochars under direct vision and deflated the preperitoneal space. The mesh was seen to lie in the appropriate location. I then removed the trocar the umbilicus. I repaired the fascia at the umbilicus with figure-of-eight 0 Vicryl suture. I then anesthetized all incisions with Marcaine and performed a right ilioinguinal nerve block with Marcaine as well. I then closed all skin incisions with 4-0 Monocryl and Dermabond. The patient tolerated procedure well. All the counts were correct at the end of procedure. The patient was an exudate in the operating room and taken in a stable condition to the recovery room.          Lillard Bailon A   Date: 12/04/2016  Time: 11:25 AM

## 2016-12-04 NOTE — Transfer of Care (Signed)
Immediate Anesthesia Transfer of Care Note  Patient: David Wright  Procedure(s) Performed: Procedure(s): LAPAROSCOPIC RIGHT INGUINAL HERNIA WITH MESH (Right) INSERTION OF MESH (Right)  Patient Location: PACU  Anesthesia Type:General  Level of Consciousness: awake, alert  and oriented  Airway & Oxygen Therapy: Patient Spontanous Breathing and Patient connected to nasal cannula oxygen  Post-op Assessment: Report given to RN  Post vital signs: Reviewed and stable  Last Vitals:  Vitals:   12/04/16 0924 12/04/16 1133  BP: (!) 167/82 (P) 110/65  Pulse: 63   Resp: 18   Temp: 36.8 C (P) 36.4 C    Last Pain:  Vitals:   12/04/16 0924  TempSrc: Oral      Patients Stated Pain Goal: 3 ([id number])  Complications: No apparent anesthesia complications

## 2016-12-05 ENCOUNTER — Encounter (HOSPITAL_COMMUNITY): Payer: Self-pay | Admitting: Surgery

## 2016-12-17 DIAGNOSIS — E7800 Pure hypercholesterolemia, unspecified: Secondary | ICD-10-CM | POA: Diagnosis not present

## 2017-04-04 DIAGNOSIS — H2513 Age-related nuclear cataract, bilateral: Secondary | ICD-10-CM | POA: Diagnosis not present

## 2017-04-18 ENCOUNTER — Encounter (HOSPITAL_COMMUNITY): Payer: Self-pay | Admitting: Nurse Practitioner

## 2017-04-18 ENCOUNTER — Telehealth: Payer: Self-pay | Admitting: Internal Medicine

## 2017-04-18 ENCOUNTER — Ambulatory Visit (HOSPITAL_COMMUNITY)
Admission: RE | Admit: 2017-04-18 | Discharge: 2017-04-18 | Disposition: A | Discharge status: Home / Self Care | Payer: Medicare Other | Source: Ambulatory Visit | Attending: Nurse Practitioner | Admitting: Nurse Practitioner

## 2017-04-18 VITALS — BP 136/84 | HR 54 | Ht 69.0 in | Wt 168.6 lb

## 2017-04-18 DIAGNOSIS — Z96641 Presence of right artificial hip joint: Secondary | ICD-10-CM | POA: Diagnosis not present

## 2017-04-18 DIAGNOSIS — Z8249 Family history of ischemic heart disease and other diseases of the circulatory system: Secondary | ICD-10-CM | POA: Diagnosis not present

## 2017-04-18 DIAGNOSIS — I480 Paroxysmal atrial fibrillation: Secondary | ICD-10-CM | POA: Diagnosis not present

## 2017-04-18 DIAGNOSIS — R531 Weakness: Secondary | ICD-10-CM | POA: Diagnosis not present

## 2017-04-18 DIAGNOSIS — Z800 Family history of malignant neoplasm of digestive organs: Secondary | ICD-10-CM | POA: Diagnosis not present

## 2017-04-18 DIAGNOSIS — J45909 Unspecified asthma, uncomplicated: Secondary | ICD-10-CM | POA: Diagnosis not present

## 2017-04-18 DIAGNOSIS — Z7982 Long term (current) use of aspirin: Secondary | ICD-10-CM | POA: Diagnosis not present

## 2017-04-18 DIAGNOSIS — R0602 Shortness of breath: Secondary | ICD-10-CM | POA: Diagnosis not present

## 2017-04-18 DIAGNOSIS — Z79899 Other long term (current) drug therapy: Secondary | ICD-10-CM | POA: Insufficient documentation

## 2017-04-18 DIAGNOSIS — Z7951 Long term (current) use of inhaled steroids: Secondary | ICD-10-CM | POA: Insufficient documentation

## 2017-04-18 LAB — TSH: TSH: 0.873 u[IU]/mL (ref 0.350–4.500)

## 2017-04-18 LAB — BASIC METABOLIC PANEL
ANION GAP: 7 (ref 5–15)
BUN: 17 mg/dL (ref 6–20)
CALCIUM: 8.9 mg/dL (ref 8.9–10.3)
CO2: 22 mmol/L (ref 22–32)
Chloride: 109 mmol/L (ref 101–111)
Creatinine, Ser: 0.93 mg/dL (ref 0.61–1.24)
GFR calc Af Amer: >60 mL/min (ref >60)
GLUCOSE: 94 mg/dL (ref 65–99)
Potassium: 4.2 mmol/L (ref 3.5–5.1)
Sodium: 138 mmol/L (ref 135–145)

## 2017-04-18 LAB — CBC
HCT: 45.3 % (ref 39.0–52.0)
Hemoglobin: 15.5 g/dL (ref 13.0–17.0)
MCH: 31.8 pg (ref 26.0–34.0)
MCHC: 34.2 g/dL (ref 30.0–36.0)
MCV: 92.8 fL (ref 78.0–100.0)
Platelets: 190 10*3/uL (ref 150–400)
RBC: 4.88 MIL/uL (ref 4.22–5.81)
RDW: 12.9 % (ref 11.5–15.5)
WBC: 6.3 10*3/uL (ref 4.0–10.5)

## 2017-04-18 MED ORDER — METOPROLOL SUCCINATE ER 25 MG PO TB24: 12.5000 mg | ORAL_TABLET | Freq: Every day | ORAL | 6 refills | Status: DC

## 2017-04-18 NOTE — Telephone Encounter (Signed)
Close encounter 

## 2017-04-18 NOTE — Patient Instructions (Addendum)
We will be in touch as results from testing come through.   Scheduler will be in touch to schedule testing.  Your physician has recommended you make the following change in your medication:  1)Stop metoprolol (lopressor) 2)Start Metoprolol (toprol) 12.5mg  once a day at bedtime (1/2 of a 25mg  tablet)

## 2017-04-18 NOTE — Progress Notes (Signed)
Patient ID: David Wright, male   DOB: [birth year], 71 y.o.   MRN: [id number]      Primary Care Physician: Seward Carol, MD Referring Physician: Dr. Radford Pax EP: Dr. Ronna Polio [name] is a 71 y.o. male with a h/o PAF, atrial tachycardia, PAC's, pt of Dr. Ashok Norris, being seen in  [place] clinic today for c/o's of fatigue/ weakness. He has also noted that when he is exertional he will be winded and at times some chest pressure. He has his usual afib episodes which have not changed in frequency. He can take an extra flecainide and the episode of afib is over within 5 hours. When he has recently felt weak, he has been in [place]. This has been more noticeable for the last few weeks.  Today, he denies symptoms of palpitations, chest pain, shortness of breath, orthopnea, PND, lower extremity edema, dizziness, presyncope, syncope, or neurologic sequela.  Periods of weak with some exertional weakness/chest pressureThe patient is tolerating medications without difficulties and is otherwise without complaint today.   Past Medical History:  Diagnosis Date  . Anginal pain ([place])    has been worked-up but all neg.  . Arthritis   . Asthma    well controlled  . Atrial tachycardia, paroxysmal ([place])   . Complication of anesthesia 40 yrs.ago   spinal headache  . Dysrhythmia    A-Fibrillation  . Headache   . Hypercholesteremia    pt reports that his cholesterol is normal  . PAC (premature atrial contraction)   . Paroxysmal atrial fibrillation (H[facility])   . Snores   . Spinal headache    40 years ago   Past Surgical History:  Procedure Laterality Date  . COLONOSCOPY WITH PROPOFOL N/A 09/23/2016   Procedure: COLONOSCOPY WITH PROPOFOL;  Surgeon: Garlan Fair, MD;  [facility]: [facility];  Service: Endoscopy;  Laterality: N/A;  . EYE SURGERY     age 29  . INGUINAL HERNIA REPAIR Right 12/04/2016   Procedure: LAPAROSCOPIC RIGHT INGUINAL HERNIA WITH MESH;  Surgeon: Coralie Keens, MD;  Location: [facility];   Service: General;  Laterality: Right;  . INSERTION OF MESH Right 12/04/2016   Procedure: INSERTION OF MESH;  Surgeon: Coralie Keens, MD;  Location: [facility];  Service: General;  Laterality: Right;  . JOINT REPLACEMENT    . perianal fistula     had spinal anesthesia and had spinal headache after surgery, 40 years ago  . TONSILLECTOMY    . TOTAL HIP ARTHROPLASTY  10/27/2012   RIGHT HIP    Current Outpatient Prescriptions  Medication Sig Dispense Refill  . aspirin EC 81 MG tablet Take 81 mg by mouth every other day.     . flecainide (TAMBOCOR) 50 MG tablet Take 1 tablet (50 mg total) by mouth 2 (two) times daily. 180 tablet 3  . fluticasone (FLONASE) 50 MCG/ACT nasal spray Place 2 sprays into the nose daily.    . Fluticasone-Salmeterol (ADVAIR DISKUS) 100-50 MCG/DOSE AEPB Inhale 1 puff into the lungs daily.    Marland Kitchen HYDROcodone-acetaminophen (NORCO/VICODIN) 5-325 MG tablet Take 1-2 tablets by mouth every 4 (four) hours as needed for moderate pain or severe pain. 40 tablet 0  . metoprolol succinate (TOPROL XL) 25 MG 24 hr tablet Take 0.5 tablets (12.5 mg total) by mouth at bedtime. 15 tablet 6   No current facility-administered medications for this encounter.     Allergies  Allergen Reactions  . No Known Allergies     Social History   Social History  .  Marital status: Married    Spouse name: N/A  . Number of children: N/A  . Years of education: N/A   Occupational History  . Not on file.   Social History Main Topics  . Smoking status: Never Smoker  . Smokeless tobacco: Never Used  . Alcohol use 0.0 oz/week     Comment: occasionally a drink per week or less  . Drug use: No  . Sexual activity: Not on file   Other Topics Concern  . Not on file   Social History Narrative   Pt lives in [place] with spouse.   Retired [employer] projects       Family History  Problem Relation Age of Onset  . Heart attack Mother   . Heart disease Mother   .  Heart attack Father   . Heart disease Father   . Pancreatic cancer Sister     ROS- All systems are reviewed and negative except as per the HPI above  Physical Exam: Vitals:   04/18/17 1122  BP: 136/84  Pulse: (!) 54  Weight: 168 lb 9.6 oz (76.5 kg)  Height: 5\' 9"  (1.753 m)    GEN- The patient is well appearing, alert and oriented x 3 today.   Head- normocephalic, atraumatic Eyes-  Sclera clear, conjunctiva pink Ears- hearing intact Oropharynx- clear Neck- supple, no JVP Lymph- no cervical lymphadenopathy Lungs- Clear to ausculation bilaterally, normal work of breathing Heart- Regular rate and rhythm, no murmurs, rubs or gallops, PMI not laterally displaced GI- soft, NT, ND, + BS Extremities- no clubbing, cyanosis, or edema MS- no significant deformity or atrophy Skin- no rash or lesion Psych- euthymic mood, full affect Neuro- strength and sensation are intact  EKG-Sinus brady @ 54 bpm, pr int 164 ms, qrs int 84 ms, qtc 420 ms Epic records reviewed Echo- 2017-Study Conclusions  - Left ventricle: The cavity size was normal. Wall thickness was   increased in a pattern of mild LVH. The estimated ejection   fraction was 55%. Wall motion was normal; there were no regional   wall motion abnormalities. Doppler parameters are consistent with   abnormal left ventricular relaxation (grade 1 diastolic   dysfunction). - Aortic valve: There was no stenosis. 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 Aortic   root dimension: 38 mm (ED). Ascending aortic diameter: 38 mm (S). - Mitral valve: Mildly calcified annulus. There was trivial   regurgitation. - Right ventricle: The cavity size was normal. Systolic function   was normal. - Tricuspid valve: Peak RV-RA gradient (S): 25 mm Hg. - Pulmonary arteries: PA peak pressure: 28 mm Hg (S). - Inferior vena cava: The vessel was normal in size. The   respirophasic diameter changes were in the normal range (>= 50%),    consistent with normal central venous pressure.  Impressions:  - Normal LV size with EF 55%. Mild LV hypertrophy. Normal RV size   and systolic function. No significant valvular abnormalities.  Assessment and Plan: 1.PAF with low afib burden Continue flecainide/ metoprolol  2. Weakness Change lopressor 12.5 mg bid to metoprolol ER 25 mg 1/2 tab qd at bedtime Cbc,bmet,tsh today 30 day event monitor Stress myoview  2. Chadsvasc score of 1(age) Continue on asa.  F/u afib clinic  In 6 weeks  Geroge Baseman. Jakeem Grape, [facility] [address] [place], [place] [address] [phone]

## 2017-04-23 ENCOUNTER — Telehealth (HOSPITAL_COMMUNITY): Payer: Self-pay | Admitting: *Deleted

## 2017-04-23 NOTE — Telephone Encounter (Signed)
Patient given detailed instructions per Myocardial Perfusion Study Information Sheet for the test on 04/28/17 at 0745. Patient notified to arrive 15 minutes early and that it is imperative to arrive on time for appointment to keep from having the test rescheduled.  If you need to cancel or reschedule your appointment, please call the office within 24 hours of your appointment. . Patient verbalized understanding.Lindsey Demonte, [facility] [place]

## 2017-04-25 ENCOUNTER — Inpatient Hospital Stay (HOSPITAL_COMMUNITY)
Admission: RE | Admit: 2017-04-25 | Payer: TRICARE For Life (TFL) | Source: Ambulatory Visit | Admitting: Nurse Practitioner

## 2017-04-28 ENCOUNTER — Ambulatory Visit (HOSPITAL_COMMUNITY): Payer: Medicare Other | Attending: Cardiovascular Disease

## 2017-04-28 DIAGNOSIS — R531 Weakness: Secondary | ICD-10-CM | POA: Diagnosis not present

## 2017-04-28 DIAGNOSIS — R0602 Shortness of breath: Secondary | ICD-10-CM | POA: Insufficient documentation

## 2017-04-28 DIAGNOSIS — I480 Paroxysmal atrial fibrillation: Secondary | ICD-10-CM | POA: Diagnosis not present

## 2017-04-28 LAB — MYOCARDIAL PERFUSION IMAGING
CHL CUP NUCLEAR SSS: 4
CSEPEDS: 30 s
CSEPEW: 10.1 METS
Exercise duration (min): 8 min
LHR: 0.37
LV sys vol: 17 mL
LVDIAVOL: 63 mL (ref 62–150)
MPHR: 148 {beats}/min
Peak HR: 130 {beats}/min
Percent HR: 87 %
Rest HR: 60 {beats}/min
SDS: 0
SRS: 7
TID: 0.84

## 2017-04-28 MED ORDER — TECHNETIUM TC 99M TETROFOSMIN IV KIT
10.5000 | PACK | Freq: Once | INTRAVENOUS | Status: AC | PRN
  Administered 2017-04-28: 10.5 via INTRAVENOUS
  Filled 2017-04-28: qty 11

## 2017-04-28 MED ORDER — TECHNETIUM TC 99M TETROFOSMIN IV KIT
32.7000 | PACK | Freq: Once | INTRAVENOUS | Status: AC | PRN
  Administered 2017-04-28: 32.7 via INTRAVENOUS
  Filled 2017-04-28: qty 33

## 2017-05-02 ENCOUNTER — Ambulatory Visit (INDEPENDENT_AMBULATORY_CARE_PROVIDER_SITE_OTHER): Payer: Medicare Other

## 2017-05-02 ENCOUNTER — Encounter (HOSPITAL_COMMUNITY): Payer: TRICARE For Life (TFL)

## 2017-05-02 DIAGNOSIS — I480 Paroxysmal atrial fibrillation: Secondary | ICD-10-CM

## 2017-05-02 DIAGNOSIS — R531 Weakness: Secondary | ICD-10-CM | POA: Diagnosis not present

## 2017-05-06 ENCOUNTER — Telehealth: Payer: Self-pay | Admitting: Cardiology

## 2017-05-06 NOTE — Telephone Encounter (Signed)
[facility] called stating that patient had new onset atrial fibrillation with HR 100-120bpm.  Patient complained of palpitations.  Patient has already taken a second flecainide and he is feeling better.  He was instructed to call the office in the am to speak with Roderic Palau, NP for further instructions.

## 2017-05-06 NOTE — Telephone Encounter (Signed)
Talked with patient today. He was in afib about 2 hours. He took his normal dose of flecainide at 6pm then took another 50mg  at 11pm and again at 12am. He is in NSR now. His CHADVASC score is 1 and is on aspirin only. Discussed with Roderic Palau NP at this time will not make any medication adjustments. Instructed patient on proper use of extra flecainide.  Pt will call if further issues but will continue to monitor at this time. Pt verbalized understanding.

## 2017-06-05 ENCOUNTER — Ambulatory Visit (HOSPITAL_COMMUNITY)
Admission: RE | Admit: 2017-06-05 | Discharge: 2017-06-05 | Disposition: A | Discharge status: Home / Self Care | Payer: Medicare Other | Source: Ambulatory Visit | Attending: Nurse Practitioner | Admitting: Nurse Practitioner

## 2017-06-05 ENCOUNTER — Encounter (HOSPITAL_COMMUNITY): Payer: Self-pay | Admitting: Nurse Practitioner

## 2017-06-05 VITALS — BP 126/74 | HR 65 | Ht 69.0 in | Wt 169.0 lb

## 2017-06-05 DIAGNOSIS — J45909 Unspecified asthma, uncomplicated: Secondary | ICD-10-CM | POA: Diagnosis not present

## 2017-06-05 DIAGNOSIS — Z7982 Long term (current) use of aspirin: Secondary | ICD-10-CM | POA: Diagnosis not present

## 2017-06-05 DIAGNOSIS — R531 Weakness: Secondary | ICD-10-CM | POA: Insufficient documentation

## 2017-06-05 DIAGNOSIS — Z79899 Other long term (current) drug therapy: Secondary | ICD-10-CM | POA: Insufficient documentation

## 2017-06-05 DIAGNOSIS — I480 Paroxysmal atrial fibrillation: Secondary | ICD-10-CM | POA: Diagnosis not present

## 2017-06-05 MED ORDER — METOPROLOL SUCCINATE ER 25 MG PO TB24: 12.5000 mg | ORAL_TABLET | Freq: Every day | ORAL | 2 refills | Status: DC

## 2017-06-05 NOTE — Progress Notes (Signed)
Patient ID: David Wright, male   DOB: 1946/09/20, 71 y.o.   MRN: [id number]      Primary Care Physician: Seward Carol, MD Referring Physician: Dr. Radford Pax EP: Dr. Ronna Polio Autrey is a 71 y.o. male with a h/o PAF, atrial tachycardia, PAC's, pt of Dr. Ashok Norris, being seen in  [place] clinic today for c/o's of fatigue/ weakness. He has also noted that when he is exertional, he will be winded and at times some chest pressure. He has his usual afib episodes which have not changed in frequency. He can take an extra flecainide and the episode of afib is over within 5 hours. When he has recently felt weak, he has been in [place]. This has been more noticeable for the last few weeks.  F/u in afib clinic, 7/12. He wore an event monitor for the last month which showed two episodes of afib for a burden of 2%. No significant brady, tachy or pauses. The weakness he was experiencing  last month  has resolved. Stress Myoview was done and normal.  Today, he denies symptoms of palpitations, chest pain, shortness of breath, orthopnea, PND, lower extremity edema, dizziness, presyncope, syncope, or neurologic sequela. The patient is tolerating medications without difficulties and is otherwise without complaint today.   Past Medical History:  Diagnosis Date  . Anginal pain ([place])    has been worked-up but all neg.  . Arthritis   . Asthma    well controlled  . Atrial tachycardia, paroxysmal ([place])   . Complication of anesthesia 40 yrs.ago   spinal headache  . Dysrhythmia    A-Fibrillation  . Headache   . Hypercholesteremia    pt reports that his cholesterol is normal  . PAC (premature atrial contraction)   . Paroxysmal atrial fibrillation ([facility])   . Snores   . Spinal headache    40 years ago   Past Surgical History:  Procedure Laterality Date  . COLONOSCOPY WITH PROPOFOL N/A 09/23/2016   Procedure: COLONOSCOPY WITH PROPOFOL;  Surgeon: Garlan Fair, MD;  [facility];  Service: Endoscopy;   Laterality: N/A;  . EYE SURGERY     age 29  . INGUINAL HERNIA REPAIR Right 12/04/2016   Procedure: LAPAROSCOPIC RIGHT INGUINAL HERNIA WITH MESH;  Surgeon: Coralie Keens, MD;  Location: [facility];  Service: General;  Laterality: Right;  . INSERTION OF MESH Right 12/04/2016   Procedure: INSERTION OF MESH;  Surgeon: Coralie Keens, MD;  Location: [facility];  Service: General;  Laterality: Right;  . JOINT REPLACEMENT    . perianal fistula     had spinal anesthesia and had spinal headache after surgery, 40 years ago  . TONSILLECTOMY    . TOTAL HIP ARTHROPLASTY  10/27/2012   RIGHT HIP    Current Outpatient Prescriptions  Medication Sig Dispense Refill  . aspirin EC 81 MG tablet Take 81 mg by mouth every other day.     . flecainide (TAMBOCOR) 50 MG tablet Take 1 tablet (50 mg total) by mouth 2 (two) times daily. 180 tablet 3  . fluticasone (FLONASE) 50 MCG/ACT nasal spray Place 2 sprays into the nose daily.    . Fluticasone-Salmeterol (ADVAIR DISKUS) 100-50 MCG/DOSE AEPB Inhale 1 puff into the lungs daily.    Marland Kitchen HYDROcodone-acetaminophen (NORCO/VICODIN) 5-325 MG tablet Take 1-2 tablets by mouth every 4 (four) hours as needed for moderate pain or severe pain. 40 tablet 0  . metoprolol succinate (TOPROL XL) 25 MG 24 hr tablet Take 0.5 tablets (12.5  mg total) by mouth at bedtime. 45 tablet 2   No current facility-administered medications for this encounter.     Allergies  Allergen Reactions  . No Known Allergies     Social History   Social History  . Marital status: Married    Spouse name: N/A  . Number of children: N/A  . Years of education: N/A   Occupational History  . Not on file.   Social History Main Topics  . Smoking status: Never Smoker  . Smokeless tobacco: Never Used  . Alcohol use 0.0 oz/week     Comment: occasionally a drink per week or less  . Drug use: No  . Sexual activity: Not on file   Other Topics Concern  . Not on file   Social History Narrative   Pt lives in  [place] with spouse.   Retired [employer] projects       Family History  Problem Relation Age of Onset  . Heart attack Mother   . Heart disease Mother   . Heart attack Father   . Heart disease Father   . Pancreatic cancer Sister     ROS- All systems are reviewed and negative except as per the HPI above  Physical Exam: Vitals:   06/05/17 1021  BP: 126/74  Pulse: 65  Weight: 169 lb (76.7 kg)  Height: 5\' 9"  (1.753 m)    GEN- The patient is well appearing, alert and oriented x 3 today.   Head- normocephalic, atraumatic Eyes-  Sclera clear, conjunctiva pink Ears- hearing intact Oropharynx- clear Neck- supple, no JVP Lymph- no cervical lymphadenopathy Lungs- Clear to ausculation bilaterally, normal work of breathing Heart- Regular rate and rhythm, no murmurs, rubs or gallops, PMI not laterally displaced GI- soft, NT, ND, + BS Extremities- no clubbing, cyanosis, or edema MS- no significant deformity or atrophy Skin- no rash or lesion Psych- euthymic mood, full affect Neuro- strength and sensation are intact  EKG-NSR at 65 bpm, pr int 168 ms, qrs int 78 ms, qtc 420 ms Epic records reviewed 30 day holter monitor reviewed but final interpretation by MD pending Afib burden 2% Echo- 2017-Study Conclusions  - Left ventricle: The cavity size was normal. Wall thickness was   increased in a pattern of mild LVH. The estimated ejection   fraction was 55%. Wall motion was normal; there were no regional   wall motion abnormalities. Doppler parameters are consistent with   abnormal left ventricular relaxation (grade 1 diastolic   dysfunction). - Aortic valve: There was no stenosis. 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 Aortic   root dimension: 38 mm (ED). Ascending aortic diameter: 38 mm (S). - Mitral valve: Mildly calcified annulus. There was trivial   regurgitation. - Right ventricle: The cavity size was normal. Systolic  function   was normal. - Tricuspid valve: Peak RV-RA gradient (S): 25 mm Hg. - Pulmonary arteries: PA peak pressure: 28 mm Hg (S). - Inferior vena cava: The vessel was normal in size. The   respirophasic diameter changes were in the normal range (>= 50%),   consistent with normal central venous pressure.  Impressions:  - Normal LV size with EF 55%. Mild LV hypertrophy. Normal RV size   and systolic function. No significant valvular abnormalities.  Stress myoview-Study Highlights     Nuclear stress EF: 73%.  Blood pressure demonstrated a normal response to exercise.  There was no ST segment deviation noted during stress.  The study is normal.  This is a low risk study.  The left ventricular ejection fraction is hyperdynamic (>65%).   Normal exercise nuclear stress test with no evidence for prior infarct or ischemia. Excellent exercise tolerance.  Normal BP response to stress.      Assessment and Plan:  1.PAF with low afib burden per recent 30 day monitor(2%) Continue flecainide/ metoprolol Discussed with pt if afib burden increases, he can be considered for an ablation or change in antiarrythmic For now he is happy with flecainide   2. Weakness Resolved Continue metoprolol ER 25 mg 1/2 tab qd at bedtime  3. Chadsvasc score of 1(age) Continue on asa.  F/u in 3 months with Dr. Marlou Porch at pt request afib clinic as needed  Geroge Baseman. Tee Richeson, [facility] [address] [place], [place] [address] [phone]

## 2017-06-20 DIAGNOSIS — Z1389 Encounter for screening for other disorder: Secondary | ICD-10-CM | POA: Diagnosis not present

## 2017-06-20 DIAGNOSIS — Z0000 Encounter for general adult medical examination without abnormal findings: Secondary | ICD-10-CM | POA: Diagnosis not present

## 2017-08-08 DIAGNOSIS — H16221 Keratoconjunctivitis sicca, not specified as Sjogren's, right eye: Secondary | ICD-10-CM | POA: Diagnosis not present

## 2017-08-08 DIAGNOSIS — H25043 Posterior subcapsular polar age-related cataract, bilateral: Secondary | ICD-10-CM | POA: Diagnosis not present

## 2017-08-08 DIAGNOSIS — H2513 Age-related nuclear cataract, bilateral: Secondary | ICD-10-CM | POA: Diagnosis not present

## 2017-08-08 DIAGNOSIS — H25013 Cortical age-related cataract, bilateral: Secondary | ICD-10-CM | POA: Diagnosis not present

## 2017-09-15 ENCOUNTER — Encounter: Payer: Self-pay | Admitting: Cardiology

## 2017-09-15 ENCOUNTER — Ambulatory Visit (INDEPENDENT_AMBULATORY_CARE_PROVIDER_SITE_OTHER): Payer: Medicare Other | Admitting: Cardiology

## 2017-09-15 VITALS — BP 132/80 | HR 64 | Ht 69.0 in | Wt 165.0 lb

## 2017-09-15 DIAGNOSIS — I480 Paroxysmal atrial fibrillation: Secondary | ICD-10-CM

## 2017-09-15 NOTE — Patient Instructions (Signed)

## 2017-09-15 NOTE — Progress Notes (Signed)
Cardiology Office Note:    Date:  09/15/2017   ID:  David Wright, DOB May 12, 1946, MRN [id number]  PCP:  Seward Carol, MD  Cardiologist:  Candee Furbish, MD    Referring MD: Seward Carol, MD     History of Present Illness:    David Wright is a 71 y.o. male former patient of Dr. Theodosia Blender, current patient of Dr. Rayann Heman here for follow-up.  He has a history of paroxysmal atrial fibrillation, atrial tachycardia, PACs.  He previously has had exertional fatigue, chest pressure, winded.  Atrial fibrillation episodes still do occur mostly at night.  He does snore.  We did discuss how this could be associated with sleep apnea.  Does not wish to proceed with study at this time.  His wife did not tolerate CPAP mask.  Occasionally he will take an extra flecainide and this episode may last approximately 4 hours.  The weakness however has occurred when he has been in sinus.  No significant bradycardia, pauses were noted during July event monitor.  Stress test was done and was normal.  His wife was present.  She had had an SVT ablation done at the [facility] clinic.   Past Medical History:  Diagnosis Date  . Anginal pain ([place])    has been worked-up but all neg.  . Arthritis   . Asthma    well controlled  . Atrial tachycardia, paroxysmal ([place])   . Complication of anesthesia 40 yrs.ago   spinal headache  . Dysrhythmia    A-Fibrillation  . Headache   . Hypercholesteremia    pt reports that his cholesterol is normal  . PAC (premature atrial contraction)   . Paroxysmal atrial fibrillation (H[facility])   . Snores   . Spinal headache    40 years ago    Past Surgical History:  Procedure Laterality Date  . COLONOSCOPY WITH PROPOFOL N/A 09/23/2016   Procedure: COLONOSCOPY WITH PROPOFOL;  Surgeon: Garlan Fair, MD;  [facility];  Service: Endoscopy;  Laterality: N/A;  . EYE SURGERY     age 60  . INGUINAL HERNIA REPAIR Right 12/04/2016   Procedure: LAPAROSCOPIC RIGHT INGUINAL HERNIA  WITH MESH;  Surgeon: Coralie Keens, MD;  Location: [facility];  Service: General;  Laterality: Right;  . INSERTION OF MESH Right 12/04/2016   Procedure: INSERTION OF MESH;  Surgeon: Coralie Keens, MD;  Location: [facility];  Service: General;  Laterality: Right;  . JOINT REPLACEMENT    . perianal fistula     had spinal anesthesia and had spinal headache after surgery, 40 years ago  . TONSILLECTOMY    . TOTAL HIP ARTHROPLASTY  10/27/2012   RIGHT HIP    Current Medications: Current Meds  Medication Sig  . aspirin EC 81 MG tablet Take 81 mg by mouth every other day.   . flecainide (TAMBOCOR) 50 MG tablet Take 1 tablet (50 mg total) by mouth 2 (two) times daily.  . fluticasone (FLONASE) 50 MCG/ACT nasal spray Place 2 sprays into the nose daily.  . Fluticasone-Salmeterol (ADVAIR DISKUS) 100-50 MCG/DOSE AEPB Inhale 1 puff into the lungs daily.  . metoprolol succinate (TOPROL XL) 25 MG 24 hr tablet Take 0.5 tablets (12.5 mg total) by mouth at bedtime.  . [DISCONTINUED] HYDROcodone-acetaminophen (NORCO/VICODIN) 5-325 MG tablet Take 1-2 tablets by mouth every 4 (four) hours as needed for moderate pain or severe pain.     Allergies:   No known allergies   Social History   Social History  . Marital status: Married  Spouse name: N/A  . Number of children: N/A  . Years of education: N/A   Social History Main Topics  . Smoking status: Never Smoker  . Smokeless tobacco: Never Used  . Alcohol use 0.0 oz/week     Comment: occasionally a drink per week or less  . Drug use: No  . Sexual activity: Not Asked   Other Topics Concern  . None   Social History Narrative   Pt lives in [place] with spouse.   Retired [employer] projects        Family History: The patient's family history includes Heart attack in his father and mother; Heart disease in his father and mother; Pancreatic cancer in his sister. ROS:   Please see the history of present illness.    No  bleeding, no syncope, no orthopnea, no PND. all other systems reviewed and are negative.  EKGs/Labs/Other Studies Reviewed:    The following studies were reviewed today:  Echocardiogram 2017-EF 55%, mild LVH, aortic root 38 mm.  Nuclear stress test 04/28/17- normal EF, low risk, no ischemia.  EKG: No new EKG.  Recent Labs: 04/18/2017: BUN 17; Creatinine, Ser 0.93; Hemoglobin 15.5; Platelets 190; Potassium 4.2; Sodium 138; TSH 0.873  Total cholesterol 242, LDL 154, triglycerides 172, HDL 54 hemoglobin 15, TSH 0.8 platelets 190  Recent Lipid Panel    Component Value Date/Time   CHOL (H) 01/24/2011 1714    232        ATP III CLASSIFICATION:  <200     mg/dL   Desirable  200-239  mg/dL   Borderline High  >=240    mg/dL   High          TRIG 172 (H) 01/24/2011 1714   HDL 58 01/24/2011 1714   CHOLHDL 4.0 01/24/2011 1714   VLDL 34 01/24/2011 1714   LDLCALC (H) 01/24/2011 1714    140        Total Cholesterol/HDL:CHD Risk Coronary Heart Disease Risk Table                     Men   Women  1/2 Average Risk   3.4   3.3  Average Risk       5.0   4.4  2 X Average Risk   9.6   7.1  3 X Average Risk  23.4   11.0        Use the calculated Patient Ratio above and the CHD Risk Table to determine the patient's CHD Risk.        ATP III CLASSIFICATION (LDL):  <100     mg/dL   Optimal  100-129  mg/dL   Near or Above                    Optimal  130-159  mg/dL   Borderline  160-189  mg/dL   High  >190     mg/dL   Very High    Physical Exam:    VS:  BP 132/80   Pulse 64   Ht 5\' 9"  (1.753 m)   Wt 165 lb (74.8 kg)   BMI 24.37 kg/m     Wt Readings from Last 3 Encounters:  09/15/17 165 lb (74.8 kg)  06/05/17 169 lb (76.7 kg)  04/28/17 168 lb (76.2 kg)     GEN:  Well nourished, well developed in no acute distress HEENT: Normal NECK: No JVD; No carotid bruits LYMPHATICS: No lymphadenopathy CARDIAC: RRR, no  murmurs, rubs, gallops RESPIRATORY:  Clear to auscultation without rales,  wheezing or rhonchi  ABDOMEN: Soft, non-tender, non-distended MUSCULOSKELETAL:  No edema; No deformity  SKIN: Warm and dry NEUROLOGIC:  Alert and oriented x 3 PSYCHIATRIC:  Normal affect   ASSESSMENT:    1. Paroxysmal atrial fibrillation (HCC)    PLAN:    In order of problems listed above:  Paroxysmal atrial fibrillation ablation burden on event monitor - Flecainide, metoprolol.  Ablation could be an option in the future.  His burden is very low, 2%.  It also could be associated with sleep apnea since that usually happens at night.  Not interested in taking a sleep study.  Weakness seems to have resolved.  Continue with metoprolol.  On aspirin only, score is 1 for age.  We discussed how this does not actually reduce the risk of stroke and atrial fibrillation.  He has not had any history of stroke.     Medication Adjustments/Labs and Tests Ordered: Current medicines are reviewed at length with the patient today.  Concerns regarding medicines are outlined above.  No orders of the defined types were placed in this encounter.  No orders of the defined types were placed in this encounter.   Signed, Candee Furbish, MD  09/15/2017 3:05 PM    [place]

## 2017-10-27 DIAGNOSIS — J301 Allergic rhinitis due to pollen: Secondary | ICD-10-CM | POA: Diagnosis not present

## 2017-10-27 DIAGNOSIS — Z91013 Allergy to seafood: Secondary | ICD-10-CM | POA: Diagnosis not present

## 2017-10-27 DIAGNOSIS — J3089 Other allergic rhinitis: Secondary | ICD-10-CM | POA: Diagnosis not present

## 2017-10-27 DIAGNOSIS — J4530 Mild persistent asthma, uncomplicated: Secondary | ICD-10-CM | POA: Diagnosis not present

## 2017-11-02 ENCOUNTER — Other Ambulatory Visit (HOSPITAL_COMMUNITY): Payer: Self-pay | Admitting: Nurse Practitioner

## 2017-12-30 DIAGNOSIS — H02839 Dermatochalasis of unspecified eye, unspecified eyelid: Secondary | ICD-10-CM | POA: Diagnosis not present

## 2017-12-30 DIAGNOSIS — H25043 Posterior subcapsular polar age-related cataract, bilateral: Secondary | ICD-10-CM | POA: Diagnosis not present

## 2017-12-30 DIAGNOSIS — H2513 Age-related nuclear cataract, bilateral: Secondary | ICD-10-CM | POA: Diagnosis not present

## 2017-12-30 DIAGNOSIS — H25013 Cortical age-related cataract, bilateral: Secondary | ICD-10-CM | POA: Diagnosis not present

## 2018-01-13 ENCOUNTER — Other Ambulatory Visit (HOSPITAL_COMMUNITY): Payer: Self-pay | Admitting: *Deleted

## 2018-01-13 MED ORDER — METOPROLOL SUCCINATE ER 25 MG PO TB24: 12.5000 mg | ORAL_TABLET | Freq: Every day | ORAL | 3 refills | Status: DC

## 2018-05-07 DIAGNOSIS — L738 Other specified follicular disorders: Secondary | ICD-10-CM | POA: Diagnosis not present

## 2018-05-07 DIAGNOSIS — L570 Actinic keratosis: Secondary | ICD-10-CM | POA: Diagnosis not present

## 2018-05-07 DIAGNOSIS — L821 Other seborrheic keratosis: Secondary | ICD-10-CM | POA: Diagnosis not present

## 2018-05-07 DIAGNOSIS — D485 Neoplasm of uncertain behavior of skin: Secondary | ICD-10-CM | POA: Diagnosis not present

## 2018-05-07 DIAGNOSIS — C44529 Squamous cell carcinoma of skin of other part of trunk: Secondary | ICD-10-CM | POA: Diagnosis not present

## 2018-06-23 DIAGNOSIS — I480 Paroxysmal atrial fibrillation: Secondary | ICD-10-CM | POA: Diagnosis not present

## 2018-06-23 DIAGNOSIS — E7800 Pure hypercholesterolemia, unspecified: Secondary | ICD-10-CM | POA: Diagnosis not present

## 2018-06-23 DIAGNOSIS — J45909 Unspecified asthma, uncomplicated: Secondary | ICD-10-CM | POA: Diagnosis not present

## 2018-06-23 DIAGNOSIS — Z0000 Encounter for general adult medical examination without abnormal findings: Secondary | ICD-10-CM | POA: Diagnosis not present

## 2018-06-23 DIAGNOSIS — Z1389 Encounter for screening for other disorder: Secondary | ICD-10-CM | POA: Diagnosis not present

## 2018-09-23 ENCOUNTER — Ambulatory Visit (INDEPENDENT_AMBULATORY_CARE_PROVIDER_SITE_OTHER): Payer: Medicare Other | Admitting: Cardiology

## 2018-09-23 ENCOUNTER — Encounter: Payer: Self-pay | Admitting: Cardiology

## 2018-09-23 VITALS — BP 126/76 | HR 59 | Ht 69.0 in | Wt 167.6 lb

## 2018-09-23 DIAGNOSIS — I480 Paroxysmal atrial fibrillation: Secondary | ICD-10-CM

## 2018-09-23 DIAGNOSIS — I491 Atrial premature depolarization: Secondary | ICD-10-CM | POA: Diagnosis not present

## 2018-09-23 DIAGNOSIS — Z7901 Long term (current) use of anticoagulants: Secondary | ICD-10-CM | POA: Diagnosis not present

## 2018-09-23 MED ORDER — APIXABAN 5 MG PO TABS: 5.0000 mg | ORAL_TABLET | Freq: Two times a day (BID) | ORAL | 3 refills | Status: DC

## 2018-09-23 MED ORDER — APIXABAN 5 MG PO TABS: 5.0000 mg | ORAL_TABLET | Freq: Two times a day (BID) | ORAL | 11 refills | Status: DC

## 2018-09-23 NOTE — Patient Instructions (Addendum)
Your physician has recommended you make the following change in your medication:  START ELIQUIS  5 MG [place]  Your physician recommends that you return for lab work in:  [place] ([place] November )  Your physician recommends that you schedule a follow-up appointment in:  [place] NP  [place]

## 2018-09-23 NOTE — Progress Notes (Signed)
Cardiology Office Note:    Date:  09/23/2018   ID:  David Wright, DOB 03-23-1946, MRN [id number]  PCP:  Seward Carol, MD  Cardiologist:  No primary care provider on file.  Electrophysiologist:  None   Referring MD: Seward Carol, MD     History of Present Illness:    David Wright is a 72 y.o. male here for follow-up of paroxysmal H fibrillation.  Fairly low PAF burden, may be 6 times over the past year or so.  Usually he is able to terminate this with taking an extra 50 mg of flecainide.  Today we talked about anticoagulation given his age very close to 38.  Many questions were answered.  Wife was present for discussion.  Overall, no chest pain fevers chills nausea vomiting syncope bleeding.  Past Medical History:  Diagnosis Date  . Anginal pain ([place])    has been worked-up but all neg.  . Arthritis   . Asthma    well controlled  . Atrial tachycardia, paroxysmal ([place])   . Complication of anesthesia 40 yrs.ago   spinal headache  . Dysrhythmia    A-Fibrillation  . Headache   . Hypercholesteremia    pt reports that his cholesterol is normal  . PAC (premature atrial contraction)   . Paroxysmal atrial fibrillation (HCC)   . Snores   . Spinal headache    40 years ago    Past Surgical History:  Procedure Laterality Date  . COLONOSCOPY WITH PROPOFOL N/A 09/23/2016   Procedure: COLONOSCOPY WITH PROPOFOL;  Surgeon: Garlan Fair, MD;  Location: [facility];  Service: Endoscopy;  Laterality: N/A;  . EYE SURGERY     age 11  . INGUINAL HERNIA REPAIR Right 12/04/2016   Procedure: LAPAROSCOPIC RIGHT INGUINAL HERNIA WITH MESH;  Surgeon: Coralie Keens, MD;  Location: [facility];  Service: General;  Laterality: Right;  . INSERTION OF MESH Right 12/04/2016   Procedure: INSERTION OF MESH;  Surgeon: Coralie Keens, MD;  Location: [facility];  Service: General;  Laterality: Right;  . JOINT REPLACEMENT    . perianal fistula     had spinal anesthesia and had spinal headache after  surgery, 40 years ago  . TONSILLECTOMY    . TOTAL HIP ARTHROPLASTY  10/27/2012   RIGHT HIP    Current Medications: Current Meds  Medication Sig  . flecainide (TAMBOCOR) 50 MG tablet TAKE 1 TABLET TWICE A DAY  . fluticasone (FLONASE) 50 MCG/ACT nasal spray Place 2 sprays into the nose daily.  . Fluticasone-Salmeterol (ADVAIR DISKUS) 100-50 MCG/DOSE AEPB Inhale 1 puff into the lungs daily.  . metoprolol succinate (TOPROL XL) 25 MG 24 hr tablet Take 0.5 tablets (12.5 mg total) by mouth at bedtime.     Allergies:   No known allergies   Social History   Socioeconomic History  . Marital status: Married    Spouse name: Not on file  . Number of children: Not on file  . Years of education: Not on file  . Highest education level: Not on file  Occupational History  . Not on file  Social Needs  . Financial resource strain: Not on file  . Food insecurity:    Worry: Not on file    Inability: Not on file  . Transportation needs:    Medical: Not on file    Non-medical: Not on file  Tobacco Use  . Smoking status: Never Smoker  . Smokeless tobacco: Never Used  Substance and Sexual Activity  . Alcohol use: Yes  Alcohol/week: 0.0 standard drinks    Comment: occasionally a drink per week or less  . Drug use: No  . Sexual activity: Not on file  Lifestyle  . Physical activity:    Days per week: Not on file    Minutes per session: Not on file  . Stress: Not on file  Relationships  . Social connections:    Talks on phone: Not on file    Gets together: Not on file    Attends religious service: Not on file    Active member of club or organization: Not on file    Attends meetings of clubs or organizations: Not on file    Relationship status: Not on file  Other Topics Concern  . Not on file  Social History Narrative   Pt lives in [place] with spouse.   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  ROS:   Please see the history of present illness.     all other systems reviewed and are negative.  EKGs/Labs/Other Studies Reviewed:    The following studies were reviewed today:   EKG:  EKG is  ordered today.  The ekg ordered today demonstrates Sinus bradycardia rate 59 with inferior infarct pattern personally reviewed, no significant change from prior.  QRS 86, QTc 423  Recent Labs: No results found for requested labs within last 8760 hours.  Recent Lipid Panel    Component Value Date/Time   CHOL (H) 01/24/2011 1714    232        ATP III CLASSIFICATION:  <200     mg/dL   Desirable  200-239  mg/dL   Borderline High  >=240    mg/dL   High          TRIG 172 (H) 01/24/2011 1714   HDL 58 01/24/2011 1714   CHOLHDL 4.0 01/24/2011 1714   VLDL 34 01/24/2011 1714   LDLCALC (H) 01/24/2011 1714    140        Total Cholesterol/HDL:CHD Risk Coronary Heart Disease Risk Table                     Men   Women  1/2 Average Risk   3.4   3.3  Average Risk       5.0   4.4  2 X Average Risk   9.6   7.1  3 X Average Risk  23.4   11.0        Use the calculated Patient Ratio above and the CHD Risk Table to determine the patient's CHD Risk.        ATP III CLASSIFICATION (LDL):  <100     mg/dL   Optimal  100-129  mg/dL   Near or Above                    Optimal  130-159  mg/dL   Borderline  160-189  mg/dL   High  >190     mg/dL   Very High    Physical Exam:    VS:  BP 126/76   Pulse (!) 59   Ht 5\' 9"  (1.753 m)   Wt 167 lb 9.6 oz (76 kg)   SpO2 98%   BMI 24.75 kg/m     Wt Readings from Last 3 Encounters:  09/23/18  167 lb 9.6 oz (76 kg)  09/15/17 165 lb (74.8 kg)  06/05/17 169 lb (76.7 kg)     GEN:  Well nourished, well developed in no acute distress HEENT: Normal NECK: No JVD; No carotid bruits LYMPHATICS: No lymphadenopathy CARDIAC: RRR, no murmurs, rubs,  gallops RESPIRATORY:  Clear to auscultation without rales, wheezing or rhonchi  ABDOMEN: Soft, non-tender, non-distended MUSCULOSKELETAL:  No edema; No deformity  SKIN: Warm and dry NEUROLOGIC:  Alert and oriented x 3 PSYCHIATRIC:  Normal affect   ASSESSMENT:    1. Paroxysmal atrial fibrillation (HCC)   2. PAC (premature atrial contraction)   3. Chronic anticoagulation    PLAN:    In order of problems listed above:  Paroxysmal atrial fibrillation -Low burden but highly symptomatic.  Continue with flecainide 50 mg twice a day. - We have decided to start Eliquis 5 mg twice a day given his age close to 19.  We discussed risks versus benefits of this medication.  He is not on aspirin currently.  He has seen Dr. Rayann Heman in the past as well.  Discussed the potential for ablative therapy if symptoms were to progress. CHADSVASC score is almost 2 for age 62.  I will have him check his blood work in 1 month, I will have him visit Roderic Palau in the [facility] clinic in 3 months and see me in 6 months.  Chronic anticoagulation -Discussed with patient.  New start Eliquis 5 mg twice a day.  Spent 25 minutes with him in counseling.   Medication Adjustments/Labs and Tests Ordered: Current medicines are reviewed at length with the patient today.  Concerns regarding medicines are outlined above.  Orders Placed This Encounter  Procedures  . Basic metabolic panel  . CBC  . EKG 12-Lead   Meds ordered this encounter  Medications  . DISCONTD: apixaban (ELIQUIS) 5 MG TABS tablet    Sig: Take 1 tablet (5 mg total) by mouth 2 (two) times daily.    Dispense:  60 tablet    Refill:  11  . apixaban (ELIQUIS) 5 MG TABS tablet    Sig: Take 1 tablet (5 mg total) by mouth 2 (two) times daily.    Dispense:  180 tablet    Refill:  3    Patient Instructions  Your physician has recommended you make the following change in your medication:  START ELIQUIS  5 MG TWICE DAILY  Your physician recommends  that you return for lab work in:  [place] (END OF November )  Your physician recommends that you schedule a follow-up appointment in:  [place] NP  6 MONTHS  DR Joylene Grapes, MD  09/23/2018 5:07 PM    [place]

## 2018-10-15 DIAGNOSIS — H9209 Otalgia, unspecified ear: Secondary | ICD-10-CM | POA: Diagnosis not present

## 2018-10-15 DIAGNOSIS — Z23 Encounter for immunization: Secondary | ICD-10-CM | POA: Diagnosis not present

## 2018-10-26 ENCOUNTER — Other Ambulatory Visit: Payer: Medicare Other | Admitting: *Deleted

## 2018-10-26 DIAGNOSIS — J3089 Other allergic rhinitis: Secondary | ICD-10-CM | POA: Diagnosis not present

## 2018-10-26 DIAGNOSIS — J4530 Mild persistent asthma, uncomplicated: Secondary | ICD-10-CM | POA: Diagnosis not present

## 2018-10-26 DIAGNOSIS — J301 Allergic rhinitis due to pollen: Secondary | ICD-10-CM | POA: Diagnosis not present

## 2018-10-26 DIAGNOSIS — Z91013 Allergy to seafood: Secondary | ICD-10-CM | POA: Diagnosis not present

## 2018-10-26 DIAGNOSIS — I480 Paroxysmal atrial fibrillation: Secondary | ICD-10-CM | POA: Diagnosis not present

## 2018-10-26 DIAGNOSIS — I491 Atrial premature depolarization: Secondary | ICD-10-CM

## 2018-10-27 ENCOUNTER — Other Ambulatory Visit (HOSPITAL_COMMUNITY): Payer: Self-pay | Admitting: Nurse Practitioner

## 2018-10-27 LAB — BASIC METABOLIC PANEL
BUN / CREAT RATIO: 15 (ref 10–24)
BUN: 15 mg/dL (ref 8–27)
CO2: 25 mmol/L (ref 20–29)
CREATININE: 1.01 mg/dL (ref 0.76–1.27)
Calcium: 9.5 mg/dL (ref 8.6–10.2)
Chloride: 101 mmol/L (ref 96–106)
GFR calc Af Amer: 84 mL/min/{1.73_m2} (ref >59)
GFR calc non Af Amer: 73 mL/min/{1.73_m2} (ref >59)
GLUCOSE: 126 mg/dL — AB (ref 65–99)
Potassium: 4 mmol/L (ref 3.5–5.2)
SODIUM: 142 mmol/L (ref 134–144)

## 2018-10-27 LAB — CBC
Hematocrit: 43.1 % (ref 37.5–51.0)
Hemoglobin: 15.7 g/dL (ref 13.0–17.7)
MCH: 32.9 pg (ref 26.6–33.0)
MCHC: 36.4 g/dL — AB (ref 31.5–35.7)
MCV: 90 fL (ref 79–97)
PLATELETS: 241 10*3/uL (ref 150–450)
RBC: 4.77 x10E6/uL (ref 4.14–5.80)
RDW: 12.3 % (ref 12.3–15.4)
WBC: 7 10*3/uL (ref 3.4–10.8)

## 2018-11-02 ENCOUNTER — Other Ambulatory Visit (HOSPITAL_COMMUNITY): Payer: Self-pay | Admitting: *Deleted

## 2018-11-02 MED ORDER — FLECAINIDE ACETATE 50 MG PO TABS: 50.0000 mg | ORAL_TABLET | Freq: Two times a day (BID) | ORAL | 3 refills | Status: DC

## 2018-11-30 DIAGNOSIS — L299 Pruritus, unspecified: Secondary | ICD-10-CM | POA: Insufficient documentation

## 2018-12-24 ENCOUNTER — Ambulatory Visit (HOSPITAL_COMMUNITY)
Admission: RE | Admit: 2018-12-24 | Discharge: 2018-12-24 | Disposition: A | Discharge status: Home / Self Care | Payer: Medicare Other | Source: Ambulatory Visit | Attending: Nurse Practitioner | Admitting: Nurse Practitioner

## 2018-12-24 ENCOUNTER — Encounter (HOSPITAL_COMMUNITY): Payer: Self-pay | Admitting: Nurse Practitioner

## 2018-12-24 VITALS — BP 142/78 | HR 63 | Ht 69.0 in | Wt 167.0 lb

## 2018-12-24 DIAGNOSIS — Z7901 Long term (current) use of anticoagulants: Secondary | ICD-10-CM | POA: Diagnosis not present

## 2018-12-24 DIAGNOSIS — Z8249 Family history of ischemic heart disease and other diseases of the circulatory system: Secondary | ICD-10-CM | POA: Diagnosis not present

## 2018-12-24 DIAGNOSIS — I480 Paroxysmal atrial fibrillation: Secondary | ICD-10-CM | POA: Insufficient documentation

## 2018-12-24 DIAGNOSIS — Z79899 Other long term (current) drug therapy: Secondary | ICD-10-CM | POA: Diagnosis not present

## 2018-12-24 DIAGNOSIS — Z96641 Presence of right artificial hip joint: Secondary | ICD-10-CM | POA: Insufficient documentation

## 2018-12-24 DIAGNOSIS — Z800 Family history of malignant neoplasm of digestive organs: Secondary | ICD-10-CM | POA: Insufficient documentation

## 2018-12-24 DIAGNOSIS — R9431 Abnormal electrocardiogram [ECG] [EKG]: Secondary | ICD-10-CM | POA: Insufficient documentation

## 2018-12-24 NOTE — Progress Notes (Signed)
Patient ID: David Wright, male   DOB: 10-28-1946, 73 y.o.   MRN: [id number]      Primary Care Physician: David Carol, MD Referring Physician: Dr. Radford Wright EP: Dr. Ronna Polio Wright is a 73 y.o. male with a h/o PAF, atrial tachycardia, PAC's, pt of Dr. Ashok Wright, [facility] for c/o's of fatigue/ weakness. He has also noted that when he is exertional, he will be winded and at times some chest pressure. He has his usual afib episodes which have not changed in frequency. He can take an extra flecainide and the episode of afib is over within 5 hours. When he has recently felt weak, he has been in [place]. This has been more noticeable for the last few weeks.  F/u in afib clinic, 7/12. He wore an event monitor for the last month which showed two episodes of afib for a burden of 2%. No significant brady, tachy or pauses. The weakness he was experiencing  last month  has resolved. Stress Myoview was done and normal.  F/u afib clinic 12/24/18. Patient reports that he has infrequent episodes of atrial fibrillation which resolve in a few hours with an extra dose of flecainide. He states this occurs 3-4 times per year. He is happy with his current therapy.  Today, he denies symptoms of palpitations, chest pain, shortness of breath, orthopnea, PND, lower extremity edema, dizziness, presyncope, syncope, or neurologic sequela. The patient is tolerating medications without difficulties and is otherwise without complaint today.   Past Medical History:  Diagnosis Date  . Anginal pain ([place])    has been worked-up but all neg.  . Arthritis   . Asthma    well controlled  . Atrial tachycardia, paroxysmal ([place])   . Complication of anesthesia 40 yrs.ago   spinal headache  . Dysrhythmia    A-Fibrillation  . Headache   . Hypercholesteremia    pt reports that his cholesterol is normal  . PAC (premature atrial contraction)   . Paroxysmal atrial fibrillation (HCC)   . Snores   . Spinal  headache    40 years ago   Past Surgical History:  Procedure Laterality Date  . COLONOSCOPY WITH PROPOFOL N/A 09/23/2016   Procedure: COLONOSCOPY WITH PROPOFOL;  Surgeon: David Fair, MD;  [facility];  Service: Endoscopy;  Laterality: N/A;  . EYE SURGERY     age 61  . INGUINAL HERNIA REPAIR Right 12/04/2016   Procedure: LAPAROSCOPIC RIGHT INGUINAL HERNIA WITH MESH;  Surgeon: David Keens, MD;  Location: [facility];  Service: General;  Laterality: Right;  . INSERTION OF MESH Right 12/04/2016   Procedure: INSERTION OF MESH;  Surgeon: David Keens, MD;  Location: [facility];  Service: General;  Laterality: Right;  . JOINT REPLACEMENT    . perianal fistula     had spinal anesthesia and had spinal headache after surgery, 40 years ago  . TONSILLECTOMY    . TOTAL HIP ARTHROPLASTY  10/27/2012   RIGHT HIP    Current Outpatient Medications  Medication Sig Dispense Refill  . apixaban (ELIQUIS) 5 MG TABS tablet Take 1 tablet (5 mg total) by mouth 2 (two) times daily. 180 tablet 3  . flecainide (TAMBOCOR) 50 MG tablet Take 1 tablet (50 mg total) by mouth 2 (two) times daily. 180 tablet 3  . fluticasone (FLONASE) 50 MCG/ACT nasal spray Place 2 sprays into the nose daily.    . Fluticasone-Salmeterol (ADVAIR DISKUS) 100-50 MCG/DOSE AEPB Inhale 1 puff into  the lungs daily.    . TOPROL XL 25 MG 24 hr tablet TAKE ONE-HALF (1/2) TABLET AT BEDTIME 45 tablet 4   No current facility-administered medications for this encounter.     Allergies  Allergen Reactions  . No Known Allergies     Social History   Socioeconomic History  . Marital status: Married    Spouse name: Not on file  . Number of children: Not on file  . Years of education: Not on file  . Highest education level: Not on file  Occupational History  . Not on file  Social Needs  . Financial resource strain: Not on file  . Food insecurity:    Worry: Not on file    Inability: Not on file  . Transportation needs:     Medical: Not on file    Non-medical: Not on file  Tobacco Use  . Smoking status: Never Smoker  . Smokeless tobacco: Never Used  Substance and Sexual Activity  . Alcohol use: Yes    Alcohol/week: 0.0 standard drinks    Comment: occasionally a drink per week or less  . Drug use: No  . Sexual activity: Not on file  Lifestyle  . Physical activity:    Days per week: Not on file    Minutes per session: Not on file  . Stress: Not on file  Relationships  . Social connections:    Talks on phone: Not on file    Gets together: Not on file    Attends religious service: Not on file    Active member of club or organization: Not on file    Attends meetings of clubs or organizations: Not on file    Relationship status: Not on file  . Intimate partner violence:    Fear of current or ex partner: Not on file    Emotionally abused: Not on file    Physically abused: Not on file    Forced sexual activity: Not on file  Other Topics Concern  . Not on file  Social History Narrative   Pt lives in [place] with spouse.   Retired Gaffer projects    Family History  Problem Relation Age of Onset  . Heart attack Mother   . Heart disease Mother   . Heart attack Father   . Heart disease Father   . Pancreatic cancer Sister     ROS- All systems are reviewed and negative except as per the HPI above  Physical Exam: Vitals:   12/24/18 0926  BP: (!) 142/78  Pulse: 63  Weight: 75.8 kg  Height: 5\' 9"  (1.753 m)    GEN- The patient is well appearing, alert and oriented x 3 today.   HEENT-head normocephalic, atraumatic, sclera clear, conjunctiva pink, hearing intact, trachea midline. Lungs- Clear to ausculation bilaterally, normal work of breathing Heart- Regular rate and rhythm, no murmurs, rubs or gallops  GI- soft, NT, ND, + BS Extremities- no clubbing, cyanosis, or edema MS- no significant deformity or atrophy Skin- no rash or lesion Psych- euthymic mood,  full affect Neuro- strength and sensation are intact   EKG-SR HR 63, PR 162, QRS 82, QTc 437  Epic records reviewed 30 day holter monitor reviewed but final interpretation by MD pending Afib burden 2% Echo- 2017-Study Conclusions  - Left ventricle: The cavity size was normal. Wall thickness was   increased in a pattern of mild LVH. The estimated ejection   fraction was 55%. Wall motion was normal; there  were no regional   wall motion abnormalities. Doppler parameters are consistent with   abnormal left ventricular relaxation (grade 1 diastolic   dysfunction). - Aortic valve: There was no stenosis. 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 Aortic   root dimension: 38 mm (ED). Ascending aortic diameter: 38 mm (S). - Mitral valve: Mildly calcified annulus. There was trivial   regurgitation. - Right ventricle: The cavity size was normal. Systolic function   was normal. - Tricuspid valve: Peak RV-RA gradient (S): 25 mm Hg. - Pulmonary arteries: PA peak pressure: 28 mm Hg (S). - Inferior vena cava: The vessel was normal in size. The   respirophasic diameter changes were in the normal range (>= 50%),   consistent with normal central venous pressure.  Impressions:  - Normal LV size with EF 55%. Mild LV hypertrophy. Normal RV size   and systolic function. No significant valvular abnormalities.  Stress myoview-Study Highlights     Nuclear stress EF: 73%.  Blood pressure demonstrated a normal response to exercise.  There was no ST segment deviation noted during stress.  The study is normal.  This is a low risk study.  The left ventricular ejection fraction is hyperdynamic (>65%).   Normal exercise nuclear stress test with no evidence for prior infarct or ischemia. Excellent exercise tolerance.  Normal BP response to stress.      Assessment and Plan:  1. Paroxysmal atrial fibrillation Patient has infrequent breakthrough episodes which resolve with  an extra PRN flecainide. Patient is satisfied with current therapy. We discussed that if breakthrough episodes become more frequent or last longer we could consider increasing daily flecainide, change to Tikosyn, or consider ablation. Continue Eliquis 5 mg BID for CHADS2VASC score of 2 (turning 73 soon). Continue Toprol 12.5 mg daily   F/u in May with Dr Marlou Porch as scheduled.  [facility] [address] [place], [place] [address] [phone]

## 2018-12-28 ENCOUNTER — Other Ambulatory Visit: Payer: Self-pay | Admitting: Sports Medicine

## 2018-12-28 ENCOUNTER — Encounter: Payer: Self-pay | Admitting: Sports Medicine

## 2018-12-28 ENCOUNTER — Ambulatory Visit (INDEPENDENT_AMBULATORY_CARE_PROVIDER_SITE_OTHER): Payer: Medicare Other

## 2018-12-28 ENCOUNTER — Ambulatory Visit (INDEPENDENT_AMBULATORY_CARE_PROVIDER_SITE_OTHER): Payer: Medicare Other | Admitting: Sports Medicine

## 2018-12-28 DIAGNOSIS — M2041 Other hammer toe(s) (acquired), right foot: Secondary | ICD-10-CM

## 2018-12-28 DIAGNOSIS — M779 Enthesopathy, unspecified: Secondary | ICD-10-CM

## 2018-12-28 DIAGNOSIS — M109 Gout, unspecified: Secondary | ICD-10-CM

## 2018-12-28 DIAGNOSIS — M79674 Pain in right toe(s): Secondary | ICD-10-CM | POA: Diagnosis not present

## 2018-12-28 MED ORDER — METHYLPREDNISOLONE 4 MG PO TBPK: ORAL_TABLET | ORAL | 0 refills | Status: DC

## 2018-12-28 NOTE — Progress Notes (Unsigned)
   Subjective:    Patient ID: David Wright, male    DOB: [birth year], 73 y.o.   MRN: [id number]  HPI    Review of Systems  All other systems reviewed and are negative.      Objective:   Physical Exam        Assessment & Plan:

## 2018-12-28 NOTE — Progress Notes (Signed)
Subjective: David Wright is a 73 y.o. male patient who presents to office for evaluation of Right foot pain. Patient complains of progressive pain especially over the last 2 months at the 3rd toe on right foot Ranks pain 1-2/10 now but with sharp episodes that lasts a few seconds that shoots to the toe that makes the pain level increase to 9/10. Patient has tried no treatment. Patient denies any other pedal complaints. Denies injury/trip/fall/sprain/any causative factors.   Review of Systems  All other systems reviewed and are negative.    Patient Active Problem List   Diagnosis Date Noted  . Ear itching 11/30/2018  . Snoring 04/29/2016  . Atrial tachycardia, paroxysmal ([place])   . Paroxysmal atrial fibrillation (HCC)   . Asthma   . Hypercholesteremia   . PAC (premature atrial contraction)   . Acute blood loss anemia 10/28/2012    Class: Acute  . DJD (degenerative joint disease) of hip 10/27/2012    Class: Chronic    Current Outpatient Medications on File Prior to Visit  Medication Sig Dispense Refill  . levalbuterol (XOPENEX HFA) 45 MCG/ACT inhaler     . NEOMYCIN-POLYMYXIN-HYDROCORTISONE (CORTISPORIN) 1 % SOLN OTIC solution INT 4 DROPS INTO AFFECTED EAR TID FOR 7 DAYS    . apixaban (ELIQUIS) 5 MG TABS tablet Take 1 tablet (5 mg total) by mouth 2 (two) times daily. 180 tablet 3  . flecainide (TAMBOCOR) 50 MG tablet Take 1 tablet (50 mg total) by mouth 2 (two) times daily. 180 tablet 3  . fluticasone (FLONASE) 50 MCG/ACT nasal spray Place 2 sprays into the nose daily.    . Fluticasone-Salmeterol (ADVAIR DISKUS) 100-50 MCG/DOSE AEPB Inhale 1 puff into the lungs daily.    . TOPROL XL 25 MG 24 hr tablet TAKE ONE-HALF (1/2) TABLET AT BEDTIME 45 tablet 4   No current facility-administered medications on file prior to visit.     Allergies  Allergen Reactions  . No Known Allergies     Objective:  General: Alert and oriented x3 in no acute distress  Dermatology: No open lesions  bilateral lower extremities, no webspace macerations, no ecchymosis bilateral, all nails x 10 are well manicured.  Vascular: Dorsalis Pedis and Posterior Tibial pedal pulses palpable, Capillary Fill Time 3 seconds,(+) pedal hair growth bilateral, no edema bilateral lower extremities, Temperature gradient within normal limits.  Neurology: Gross sensation intact via light touch bilateral.  Musculoskeletal: Mild tenderness with palpation at right 3rd toe at IPJ,Minimal hammertoe, Strength within normal limits in all groups bilateral.   Gait:Non-Antalgic gait  Xrays  Right Foot   Impression:Mild soft tissue swelling at 3rd toe, OA at 1st MTPJ, inferior spur, no other acute findings   Assessment and Plan: Problem List Items Addressed This Visit    None    Visit Diagnoses    Gout of right foot, unspecified cause, unspecified chronicity    -  Primary   Relevant Medications   methylPREDNISolone (MEDROL DOSEPAK) 4 MG TBPK tablet   Other Relevant Orders   DG Foot Complete Right   Capsulitis       Relevant Medications   methylPREDNISolone (MEDROL DOSEPAK) 4 MG TBPK tablet   Toe pain, right          -Complete examination performed -Xrays reviewed -Discussed treatement options for capsulitis vs early hammertoe vs resolving gout -Rx Medrol dosepak -Recommend topical pain cream, epsom soaks,good supportive shoes -Patient to return to office as needed or sooner if condition worsens.  Landis Martins, DPM

## 2019-01-08 DIAGNOSIS — H18413 Arcus senilis, bilateral: Secondary | ICD-10-CM | POA: Diagnosis not present

## 2019-01-08 DIAGNOSIS — H2513 Age-related nuclear cataract, bilateral: Secondary | ICD-10-CM | POA: Diagnosis not present

## 2019-01-08 DIAGNOSIS — H02831 Dermatochalasis of right upper eyelid: Secondary | ICD-10-CM | POA: Diagnosis not present

## 2019-01-08 DIAGNOSIS — H25013 Cortical age-related cataract, bilateral: Secondary | ICD-10-CM | POA: Diagnosis not present

## 2019-02-23 ENCOUNTER — Ambulatory Visit (INDEPENDENT_AMBULATORY_CARE_PROVIDER_SITE_OTHER): Payer: Medicare Other | Admitting: Sports Medicine

## 2019-02-23 ENCOUNTER — Other Ambulatory Visit: Payer: Self-pay

## 2019-02-23 ENCOUNTER — Encounter: Payer: Self-pay | Admitting: Sports Medicine

## 2019-02-23 VITALS — Temp 98.0°F

## 2019-02-23 DIAGNOSIS — M7751 Other enthesopathy of right foot: Secondary | ICD-10-CM | POA: Diagnosis not present

## 2019-02-23 DIAGNOSIS — D3610 Benign neoplasm of peripheral nerves and autonomic nervous system, unspecified: Secondary | ICD-10-CM

## 2019-02-23 DIAGNOSIS — M79674 Pain in right toe(s): Secondary | ICD-10-CM

## 2019-02-23 DIAGNOSIS — M779 Enthesopathy, unspecified: Secondary | ICD-10-CM

## 2019-02-23 MED ORDER — TRIAMCINOLONE ACETONIDE 10 MG/ML IJ SUSP
10.0000 mg | Freq: Once | INTRAMUSCULAR | Status: AC
  Administered 2019-02-23: 10 mg

## 2019-02-23 NOTE — Patient Instructions (Addendum)
For nerves recommend Vit B complex or OTC nerve supplement like NeuRemedy

## 2019-02-23 NOTE — Progress Notes (Signed)
  Subjective: Cainan Trull is a 73 y.o. male patient who returns to office for follow up evaluation of Right foot pain. Patient now ranks pain 4/10 and states that he is feeling like the pain is the same or sometimes worse at night with burning to 3rd toe and shooting pain at night. Patient has taken the medrol dose pack with no relief in symptoms. Patient denies any other pedal complaints.   Patient Active Problem List   Diagnosis Date Noted  . Ear itching 11/30/2018  . Snoring 04/29/2016  . Atrial tachycardia, paroxysmal ([place])   . Paroxysmal atrial fibrillation (HCC)   . Asthma   . Hypercholesteremia   . PAC (premature atrial contraction)   . Acute blood loss anemia 10/28/2012    Class: Acute  . 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 apixaban (ELIQUIS) 5 MG TABS tablet Take 1 tablet (5 mg total) by mouth 2 (two) times daily. 180 tablet 3  . flecainide (TAMBOCOR) 50 MG tablet Take 1 tablet (50 mg total) by mouth 2 (two) times daily. 180 tablet 3  . Fluocinolone Acetonide 0.01 % OIL     . fluticasone (FLONASE) 50 MCG/ACT nasal spray Place 2 sprays into the nose daily.    . Fluticasone-Salmeterol (ADVAIR DISKUS) 100-50 MCG/DOSE AEPB Inhale 1 puff into the lungs daily.    Marland Kitchen levalbuterol (XOPENEX HFA) 45 MCG/ACT inhaler     . NEOMYCIN-POLYMYXIN-HYDROCORTISONE (CORTISPORIN) 1 % SOLN OTIC solution INT 4 DROPS INTO AFFECTED EAR TID FOR 7 DAYS    . TOPROL XL 25 MG 24 hr tablet TAKE ONE-HALF (1/2) TABLET AT BEDTIME 45 tablet 4   No current facility-administered medications on file prior to visit.    Allergies  Allergen Reactions  . No Known Allergies     Objective:  General: Alert and oriented x3 in no acute distress  Dermatology: No open lesions bilateral lower extremities, no webspace macerations, no ecchymosis bilateral, all nails x 10 are well  manicured.  Neurovascular: Intact. No lower extremity edema bilateral.   Musculoskeletal:There is tenderness with palpation at right 3rd toe at DIPJ with mild swelling . Strength within normal limits in all groups bilateral.   Assessment and Plan: Problem List Items Addressed This Visit    None    Visit Diagnoses    Capsulitis    -  Primary   Relevant Medications   triamcinolone acetonide (KENALOG) 10 MG/ML injection 10 mg (Completed) (Start on 02/23/2019  4:00 PM)   Toe pain, right       Neuroma          -Complete examination performed -Discussed treatement options for neuroma vs capsulitis vs gout -After oral consent and aseptic prep, injected a mixture containing 1 ml of 2%  plain lidocaine, 1 ml 0.5% plain marcaine, 0.5 ml of kenalog 10 and 0.5 ml of dexamethasone phosphate into R 3rd toe without complication. Post-injection care discussed with patient.  -Recommend OTC vit B complex -Recommend good supportive shoes that do not rub the toe -Patient to return to office as needed or sooner if condition worsens. If recurs would benefit from MRI and possible blood work  Landis Martins, DPM

## 2019-03-17 ENCOUNTER — Telehealth (INDEPENDENT_AMBULATORY_CARE_PROVIDER_SITE_OTHER): Payer: Medicare Other | Admitting: Cardiology

## 2019-03-17 ENCOUNTER — Encounter: Payer: Self-pay | Admitting: Cardiology

## 2019-03-17 ENCOUNTER — Other Ambulatory Visit: Payer: Self-pay

## 2019-03-17 VITALS — BP 122/78 | HR 68 | Ht 69.0 in | Wt 162.0 lb

## 2019-03-17 DIAGNOSIS — I480 Paroxysmal atrial fibrillation: Secondary | ICD-10-CM | POA: Diagnosis not present

## 2019-03-17 DIAGNOSIS — Z7901 Long term (current) use of anticoagulants: Secondary | ICD-10-CM

## 2019-03-17 NOTE — Telephone Encounter (Signed)
New Message   Patient returning call please give him a call back.

## 2019-03-17 NOTE — Progress Notes (Signed)
Virtual Visit via Video Note   This visit type was conducted due to national recommendations for restrictions regarding the COVID-19 Pandemic (e.g. social distancing) in an effort to limit this patient's exposure and mitigate transmission in our community.  Due to his co-morbid illnesses, this patient is at least at moderate risk for complications without adequate follow up.  This format is felt to be most appropriate for this patient at this time.  All issues noted in this document were discussed and addressed.  A limited physical exam was performed with this format.  Please refer to the patient's chart for his consent to telehealth for [facility].   Evaluation Performed:  Follow-up visit  Date:  03/17/2019   ID:  David Wright, DOB December 04, 1945, MRN [id number]  Patient Location: Home Provider Location: Home  PCP:  Seward Carol, MD  Cardiologist:  Candee Furbish, MD  Electrophysiologist:  None   Chief Complaint: Follow-up atrial fibrillation  History of Present Illness:    David Wright is a 73 y.o. male with here for the follow-up of paroxysmal atrial fibrillation with low burden perhaps 6 times over an entire year.  Has been able to terminate with extra 50 mg of flecainide in the past.  Overall he is been doing quite well without any chest pain or shortness of breath.  Pleased with his overall atrial fibrillation burden which is quite low.  We discussed Kardia device or apple watch.  Compliant with his medications.  Pleased with Eliquis.  At first he felt cold but now feels fine, no significant bruising.  The patient does not have symptoms concerning for COVID-19 infection (fever, chills, cough, or new shortness of breath).    Past Medical History:  Diagnosis Date  . Anginal pain ([place])    has been worked-up but all neg.  . Arthritis   . Asthma    well controlled  . Atrial tachycardia, paroxysmal ([place])   . Complication of anesthesia 40 yrs.ago   spinal headache  . Dysrhythmia     A-Fibrillation  . Headache   . Hypercholesteremia    pt reports that his cholesterol is normal  . PAC (premature atrial contraction)   . Paroxysmal atrial fibrillation (HCC)   . Snores   . Spinal headache    40 years ago   Past Surgical History:  Procedure Laterality Date  . COLONOSCOPY WITH PROPOFOL N/A 09/23/2016   Procedure: COLONOSCOPY WITH PROPOFOL;  Surgeon: Garlan Fair, MD;  Location: [facility];  Service: Endoscopy;  Laterality: N/A;  . EYE SURGERY     age 8  . INGUINAL HERNIA REPAIR Right 12/04/2016   Procedure: LAPAROSCOPIC RIGHT INGUINAL HERNIA WITH MESH;  Surgeon: Coralie Keens, MD;  Location: [place];  Service: General;  Laterality: Right;  . INSERTION OF MESH Right 12/04/2016   Procedure: INSERTION OF MESH;  Surgeon: Coralie Keens, MD;  Location: [place];  Service: General;  Laterality: Right;  . JOINT REPLACEMENT    . perianal fistula     had spinal anesthesia and had spinal headache after surgery, 40 years ago  . TONSILLECTOMY    . TOTAL HIP ARTHROPLASTY  10/27/2012   RIGHT HIP     Current Meds  Medication Sig  . apixaban (ELIQUIS) 5 MG TABS tablet Take 1 tablet (5 mg total) by mouth 2 (two) times daily.  . flecainide (TAMBOCOR) 50 MG tablet Take 1 tablet (50 mg total) by mouth 2 (two) times daily.  . Fluocinolone Acetonide 0.01 % OIL   .  fluticasone (FLONASE) 50 MCG/ACT nasal spray Place 2 sprays into the nose daily.  . Fluticasone-Salmeterol (ADVAIR DISKUS) 100-50 MCG/DOSE AEPB Inhale 1 puff into the lungs daily.  Marland Kitchen levalbuterol (XOPENEX HFA) 45 MCG/ACT inhaler   . TOPROL XL 25 MG 24 hr tablet TAKE ONE-HALF (1/2) TABLET AT BEDTIME     Allergies:   No known allergies   Social History   Tobacco Use  . Smoking status: Never Smoker  . Smokeless tobacco: Never Used  Substance Use Topics  . Alcohol use: Yes    Alcohol/week: 0.0 standard drinks    Comment: occasionally a drink per week or less  . Drug use: No     Family Hx: The patient's  family history includes Heart attack in his father and mother; Heart disease in his father and mother; Pancreatic cancer in his sister.  ROS:   Please see the history of present illness.    Denies any fevers chills nausea vomiting syncope bleeding All other systems reviewed and are negative.   Prior CV studies:   The following studies were reviewed today:  Nuclear stress test-low risk normal EF  Echocardiogram normal EF 55%  Labs/Other Tests and Data Reviewed:    EKG:  An ECG dated 09/23/18 was personally reviewed today and demonstrated:  Sinus bradycardia 59 inferior infarct pattern  Recent Labs: 10/26/2018: BUN 15; Creatinine, Ser 1.01; Hemoglobin 15.7; Platelets 241; Potassium 4.0; Sodium 142   Recent Lipid Panel Lab Results  Component Value Date/Time   CHOL (H) 01/24/2011 05:14 PM    232        ATP III CLASSIFICATION:  <200     mg/dL   Desirable  200-239  mg/dL   Borderline High  >=240    mg/dL   High          TRIG 172 (H) 01/24/2011 05:14 PM   HDL 58 01/24/2011 05:14 PM   CHOLHDL 4.0 01/24/2011 05:14 PM   LDLCALC (H) 01/24/2011 05:14 PM    140        Total Cholesterol/HDL:CHD Risk Coronary Heart Disease Risk Table                     Men   Women  1/2 Average Risk   3.4   3.3  Average Risk       5.0   4.4  2 X Average Risk   9.6   7.1  3 X Average Risk  23.4   11.0        Use the calculated Patient Ratio above and the CHD Risk Table to determine the patient's CHD Risk.        ATP III CLASSIFICATION (LDL):  <100     mg/dL   Optimal  100-129  mg/dL   Near or Above                    Optimal  130-159  mg/dL   Borderline  160-189  mg/dL   High  >190     mg/dL   Very High    Wt Readings from Last 3 Encounters:  03/17/19 162 lb (73.5 kg)  12/24/18 167 lb (75.8 kg)  09/23/18 167 lb 9.6 oz (76 kg)     Objective:    Vital Signs:  BP 122/78   Pulse 68   Ht 5\' 9"  (1.753 m)   Wt 162 lb (73.5 kg)   BMI 23.92 kg/m    VITAL SIGNS:  reviewed GEN:  no  acute  distress EYES:  sclerae anicteric, EOMI - Extraocular Movements Intact RESPIRATORY:  normal respiratory effort, symmetric expansion CARDIOVASCULAR:  no peripheral edema SKIN:  no rash, lesions or ulcers. MUSCULOSKELETAL:  no obvious deformities. NEURO:  alert and oriented x 3, no obvious focal deficit PSYCH:  normal affect  ASSESSMENT & PLAN:    Paroxysmal atrial fibrillation - Previously highly symptomatic but low burden with flecainide 50 mg twice a day. - Started Eliquis 5 mg twice a day at prior visit in October. - In the past has seen Dr. Rayann Heman.  He was also seen in the atrial fibrillation clinic on 12/24/2018.  - Toprol 12.5  -   COVID-19 Education: The signs and symptoms of COVID-19 were discussed with the patient and how to seek care for testing (follow up with PCP or arrange E-visit).  The importance of social distancing was discussed today.  Time:   Today, I have spent 15 minutes with the patient with telehealth technology discussing the above problems.     Medication Adjustments/Labs and Tests Ordered: Current medicines are reviewed at length with the patient today.  Concerns regarding medicines are outlined above.   Tests Ordered: No orders of the defined types were placed in this encounter.   Medication Changes: No orders of the defined types were placed in this encounter.   Disposition:  Follow up in 6 month(s)  Signed, Candee Furbish, MD  03/17/2019 3:17 PM    [facility]

## 2019-03-17 NOTE — Patient Instructions (Signed)
Medication Instructions:  Your provider recommends that you continue on your current medications as directed. Please refer to the Current Medication list given to you today.    Labwork: None  Testing/Procedures: None  Follow-Up: Your provider wants you to follow-up in: 6 months with Dr. Marlou Porch. You will receive a reminder letter in the mail two months in advance. If you don't receive a letter, please call our office to schedule the follow-up appointment.

## 2019-03-17 NOTE — Telephone Encounter (Signed)
Forwarding to Triage for further review

## 2019-03-29 ENCOUNTER — Ambulatory Visit: Payer: Medicare Other | Admitting: Cardiology

## 2019-04-20 DIAGNOSIS — M25561 Pain in right knee: Secondary | ICD-10-CM | POA: Diagnosis not present

## 2019-04-20 DIAGNOSIS — M25562 Pain in left knee: Secondary | ICD-10-CM | POA: Diagnosis not present

## 2019-04-22 DIAGNOSIS — M2241 Chondromalacia patellae, right knee: Secondary | ICD-10-CM | POA: Diagnosis not present

## 2019-04-22 DIAGNOSIS — M2242 Chondromalacia patellae, left knee: Secondary | ICD-10-CM | POA: Diagnosis not present

## 2019-04-26 DIAGNOSIS — M2242 Chondromalacia patellae, left knee: Secondary | ICD-10-CM | POA: Diagnosis not present

## 2019-04-26 DIAGNOSIS — M2241 Chondromalacia patellae, right knee: Secondary | ICD-10-CM | POA: Diagnosis not present

## 2019-04-29 DIAGNOSIS — M2241 Chondromalacia patellae, right knee: Secondary | ICD-10-CM | POA: Diagnosis not present

## 2019-04-29 DIAGNOSIS — M2242 Chondromalacia patellae, left knee: Secondary | ICD-10-CM | POA: Diagnosis not present

## 2019-05-06 DIAGNOSIS — M2242 Chondromalacia patellae, left knee: Secondary | ICD-10-CM | POA: Diagnosis not present

## 2019-05-06 DIAGNOSIS — M2241 Chondromalacia patellae, right knee: Secondary | ICD-10-CM | POA: Diagnosis not present

## 2019-05-10 DIAGNOSIS — M2241 Chondromalacia patellae, right knee: Secondary | ICD-10-CM | POA: Diagnosis not present

## 2019-05-10 DIAGNOSIS — M2242 Chondromalacia patellae, left knee: Secondary | ICD-10-CM | POA: Diagnosis not present

## 2019-05-17 DIAGNOSIS — M2241 Chondromalacia patellae, right knee: Secondary | ICD-10-CM | POA: Diagnosis not present

## 2019-05-17 DIAGNOSIS — M2242 Chondromalacia patellae, left knee: Secondary | ICD-10-CM | POA: Diagnosis not present

## 2019-05-20 DIAGNOSIS — M2241 Chondromalacia patellae, right knee: Secondary | ICD-10-CM | POA: Diagnosis not present

## 2019-05-20 DIAGNOSIS — M2242 Chondromalacia patellae, left knee: Secondary | ICD-10-CM | POA: Diagnosis not present

## 2019-06-10 DIAGNOSIS — Z85828 Personal history of other malignant neoplasm of skin: Secondary | ICD-10-CM | POA: Diagnosis not present

## 2019-06-10 DIAGNOSIS — L570 Actinic keratosis: Secondary | ICD-10-CM | POA: Diagnosis not present

## 2019-07-19 DIAGNOSIS — E7800 Pure hypercholesterolemia, unspecified: Secondary | ICD-10-CM | POA: Diagnosis not present

## 2019-07-19 DIAGNOSIS — Z0000 Encounter for general adult medical examination without abnormal findings: Secondary | ICD-10-CM | POA: Diagnosis not present

## 2019-07-19 DIAGNOSIS — Z1389 Encounter for screening for other disorder: Secondary | ICD-10-CM | POA: Diagnosis not present

## 2019-07-19 DIAGNOSIS — I480 Paroxysmal atrial fibrillation: Secondary | ICD-10-CM | POA: Diagnosis not present

## 2019-08-29 ENCOUNTER — Other Ambulatory Visit: Payer: Self-pay | Admitting: Cardiology

## 2019-08-30 NOTE — Telephone Encounter (Signed)
Eliquis 5mg  refill request received; pt is 73yrs old, weight-73.5kg, Crea-0.95 on 07/19/2019 via [facility] via [facility], Diagnosis-Afib, and last seen by Dr. Marlou Porch via Telemedicine on 03/17/2019. Dose is appropriate based on dosing criteria. Will send in refill to requested pharmacy.

## 2019-09-27 ENCOUNTER — Encounter: Payer: Self-pay | Admitting: Cardiology

## 2019-09-27 ENCOUNTER — Other Ambulatory Visit: Payer: Self-pay

## 2019-09-27 ENCOUNTER — Ambulatory Visit (INDEPENDENT_AMBULATORY_CARE_PROVIDER_SITE_OTHER): Payer: Medicare Other | Admitting: Cardiology

## 2019-09-27 VITALS — BP 114/60 | HR 63 | Ht 69.0 in | Wt 164.4 lb

## 2019-09-27 DIAGNOSIS — Z7901 Long term (current) use of anticoagulants: Secondary | ICD-10-CM

## 2019-09-27 DIAGNOSIS — I491 Atrial premature depolarization: Secondary | ICD-10-CM

## 2019-09-27 DIAGNOSIS — I480 Paroxysmal atrial fibrillation: Secondary | ICD-10-CM | POA: Diagnosis not present

## 2019-09-27 MED ORDER — FLECAINIDE ACETATE 50 MG PO TABS: 50.0000 mg | ORAL_TABLET | Freq: Two times a day (BID) | ORAL | 3 refills | Status: DC

## 2019-09-27 MED ORDER — METOPROLOL SUCCINATE ER 25 MG PO TB24: ORAL_TABLET | ORAL | 4 refills | Status: DC

## 2019-09-27 NOTE — Patient Instructions (Signed)
Medication Instructions:  The current medical regimen is effective;  continue present plan and medications.  *If you need a refill on your cardiac medications before your next appointment, please call your pharmacy*  Follow-Up: At [facility], you and your health needs are our priority.  As part of our continuing mission to provide you with exceptional heart care, we have created designated Provider Care Teams.  These Care Teams include your primary Cardiologist (physician) and Advanced Practice Providers (APPs -  Physician Assistants and Nurse Practitioners) who all work together to provide you with the care you need, when you need it.  Your next appointment:   12 months  The format for your next appointment:   In Person  Provider:   You may see Mark Skains, MD or one of the following Advanced Practice Providers on your designated Care Team:    Lori Gerhardt, NP  Laura Ingold, NP  Jill McDaniel, NP   Thank you for choosing [facility]!!      

## 2019-09-27 NOTE — Progress Notes (Signed)
Cardiology Office Note:    Date:  09/27/2019   ID:  David Wright, DOB 1946-06-05, MRN [id number]  PCP:  Seward Carol, MD  Cardiologist:  Candee Furbish, MD  Electrophysiologist:  None   Referring MD: Seward Carol, MD     History of Present Illness:    David Wright is a 73 y.o. male here for follow-up of atrial fibrillation paroxysmal fairly low PAF burden, may be 6 times over the past year or so.  In fact since our last talk he only had one episode.  Usually he is able to terminate this with taking an extra 50 mg of flecainide.   Last visit was via telemedicine on 03/17/2019-perhaps 6 times of the entire year had atrial fibrillation has been able to terminate it with an extra 50 mg of flecainide in the past.  No chest pain or shortness of breath pleased with his overall atrial fibrillation burden.  We had discussed prior devices such as apple watch.  Doing well on the Eliquis.  At first he stated that he felt cold but doing well.  No significant bruising.  Minor bruising.  Doing very well.  Pleased.  No fevers chills nausea vomiting syncope bleeding  Past Medical History:  Diagnosis Date  . Anginal pain ([place])    has been worked-up but all neg.  . Arthritis   . Asthma    well controlled  . Atrial tachycardia, paroxysmal ([place])   . Complication of anesthesia 40 yrs.ago   spinal headache  . Dysrhythmia    A-Fibrillation  . Headache   . Hypercholesteremia    pt reports that his cholesterol is normal  . PAC (premature atrial contraction)   . Paroxysmal atrial fibrillation (HCC)   . Snores   . Spinal headache    40 years ago    Past Surgical History:  Procedure Laterality Date  . COLONOSCOPY WITH PROPOFOL N/A 09/23/2016   Procedure: COLONOSCOPY WITH PROPOFOL;  Surgeon: Garlan Fair, MD;  [facility];  Service: Endoscopy;  Laterality: N/A;  . EYE SURGERY     age 47  . INGUINAL HERNIA REPAIR Right 12/04/2016   Procedure: LAPAROSCOPIC RIGHT INGUINAL HERNIA WITH  MESH;  Surgeon: Coralie Keens, MD;  Location: [facility];  Service: General;  Laterality: Right;  . INSERTION OF MESH Right 12/04/2016   Procedure: INSERTION OF MESH;  Surgeon: Coralie Keens, MD;  Location: [place];  Service: General;  Laterality: Right;  . JOINT REPLACEMENT    . perianal fistula     had spinal anesthesia and had spinal headache after surgery, 40 years ago  . TONSILLECTOMY    . TOTAL HIP ARTHROPLASTY  10/27/2012   RIGHT HIP    Current Medications: Current Meds  Medication Sig  . ELIQUIS 5 MG TABS tablet TAKE 1 TABLET TWICE A DAY  . flecainide (TAMBOCOR) 50 MG tablet Take 1 tablet (50 mg total) by mouth 2 (two) times daily.  . Fluocinolone Acetonide 0.01 % OIL   . fluticasone (FLONASE) 50 MCG/ACT nasal spray Place 2 sprays into the nose daily.  . Fluticasone-Salmeterol (ADVAIR DISKUS) 100-50 MCG/DOSE AEPB Inhale 1 puff into the lungs daily.  Marland Kitchen levalbuterol (XOPENEX HFA) 45 MCG/ACT inhaler   . metoprolol succinate (TOPROL XL) 25 MG 24 hr tablet TAKE ONE-HALF (1/2) TABLET AT BEDTIME  . [DISCONTINUED] flecainide (TAMBOCOR) 50 MG tablet Take 1 tablet (50 mg total) by mouth 2 (two) times daily.  . [DISCONTINUED] TOPROL XL 25 MG 24 hr tablet TAKE ONE-HALF (1/2)  TABLET AT BEDTIME     Allergies:   No known allergies   Social History   Socioeconomic History  . Marital status: Married    Spouse name: Not on file  . Number of children: Not on file  . Years of education: Not on file  . Highest education level: Not on file  Occupational History  . Not on file  Social Needs  . Financial resource strain: Not on file  . Food insecurity    Worry: Not on file    Inability: Not on file  . Transportation needs    Medical: Not on file    Non-medical: Not on file  Tobacco Use  . Smoking status: Never Smoker  . Smokeless tobacco: Never Used  Substance and Sexual Activity  . Alcohol use: Yes    Alcohol/week: 0.0 standard drinks    Comment: occasionally a drink per week or less   . Drug use: No  . Sexual activity: Not on file  Lifestyle  . Physical activity    Days per week: Not on file    Minutes per session: Not on file  . Stress: Not on file  Relationships  . Social Herbalist on phone: Not on file    Gets together: Not on file    Attends religious service: Not on file    Active member of club or organization: Not on file    Attends meetings of clubs or organizations: Not on file    Relationship status: Not on file  Other Topics Concern  . Not on file  Social History Narrative   Pt lives in [place] with spouse.   Retired Gaffer projects     Family History: The patient's family history includes Heart attack in his father and mother; Heart disease in his father and mother; Pancreatic cancer in his sister.  ROS:   Please see the history of present illness.     all other systems reviewed and are negative.  EKGs/Labs/Other Studies Reviewed:    The following studies were reviewed today: Nuclear stress test low risk normal EF echocardiogram normal EF  EKG:  Sinus bradycardia rate 59 with inferior infarct pattern personally reviewed, no significant change from prior.  QRS 86, QTc 423  Recent Labs: 10/26/2018: BUN 15; Creatinine, Ser 1.01; Hemoglobin 15.7; Platelets 241; Potassium 4.0; Sodium 142  Recent Lipid Panel    Component Value Date/Time   CHOL (H) 01/24/2011 1714    232        ATP III CLASSIFICATION:  <200     mg/dL   Desirable  200-239  mg/dL   Borderline High  >=240    mg/dL   High          TRIG 172 (H) 01/24/2011 1714   HDL 58 01/24/2011 1714   CHOLHDL 4.0 01/24/2011 1714   VLDL 34 01/24/2011 1714   LDLCALC (H) 01/24/2011 1714    140        Total Cholesterol/HDL:CHD Risk Coronary Heart Disease Risk Table                     Men   Women  1/2 Average Risk   3.4   3.3  Average Risk       5.0   4.4  2 X Average Risk   9.6   7.1  3 X Average Risk  23.4   11.0        Use the  calculated Patient Ratio above and the CHD Risk Table to determine the patient's CHD Risk.        ATP III CLASSIFICATION (LDL):  <100     mg/dL   Optimal  100-129  mg/dL   Near or Above                    Optimal  130-159  mg/dL   Borderline  160-189  mg/dL   High  >190     mg/dL   Very High    Physical Exam:    VS:  BP 114/60   Pulse 63   Ht 5\' 9"  (1.753 m)   Wt 164 lb 6.4 oz (74.6 kg)   SpO2 96%   BMI 24.28 kg/m     Wt Readings from Last 3 Encounters:  09/27/19 164 lb 6.4 oz (74.6 kg)  03/17/19 162 lb (73.5 kg)  12/24/18 167 lb (75.8 kg)     GEN: Well nourished, well developed, in no acute distress  HEENT: normal  Neck: no JVD, carotid bruits, or masses Cardiac: RRR; no murmurs, rubs, or gallops,no edema  Respiratory:  clear to auscultation bilaterally, normal work of breathing GI: soft, nontender, nondistended, + BS MS: no deformity or atrophy  Skin: warm and dry, no rash Neuro:  Alert and Oriented x 3, Strength and sensation are intact Psych: euthymic mood, full affect   ASSESSMENT:    1. Paroxysmal atrial fibrillation (HCC)   2. Chronic anticoagulation   3. PAC (premature atrial contraction)    PLAN:    In order of problems listed above:  Paroxysmal atrial fibrillation -Low burden but highly symptomatic.  Doing well on Eliquis.  Continue with flecainide 50 mg twice a day.  An extra 50 of flecainide if he does have an issue.  Very rare. -  He has seen Dr. Rayann Heman in the past as well.  Discussed the potential for ablative therapy if symptoms were to progress. CHADSVASC score is 2 for age 24.  No changes made.  Refills   Chronic anticoagulation -Discussed with patient.  Eliquis 5 mg twice a day.  Blood work excellent.  Hemoglobin normal renal function normal followed by Dr. Delfina Redwood.   Medication Adjustments/Labs and Tests Ordered: Current medicines are reviewed at length with the patient today.  Concerns regarding medicines are outlined above.  No  orders of the defined types were placed in this encounter.  Meds ordered this encounter  Medications  . flecainide (TAMBOCOR) 50 MG tablet    Sig: Take 1 tablet (50 mg total) by mouth 2 (two) times daily.    Dispense:  180 tablet    Refill:  3  . metoprolol succinate (TOPROL XL) 25 MG 24 hr tablet    Sig: TAKE ONE-HALF (1/2) TABLET AT BEDTIME    Dispense:  45 tablet    Refill:  4    Patient Instructions  Medication Instructions:  The current medical regimen is effective;  continue present plan and medications.  *If you need a refill on your cardiac medications before your next appointment, please call your pharmacy*  Follow-Up: At [facility], you and your health needs are our priority.  As part of our continuing mission to provide you with exceptional heart care, we have created designated Provider Care Teams.  These Care Teams include your primary Cardiologist (physician) and Advanced Practice Providers (APPs -  Physician Assistants and Nurse Practitioners) who all work together to provide you with the care you need, when you  need it.  Your next appointment:   12 months  The format for your next appointment:   In Person  Provider:   You may see Candee Furbish, MD or one of the following Advanced Practice Providers on your designated Care Team:    Truitt Merle, NP  Cecilie Kicks, NP  Kathyrn Drown, NP   Thank you for choosing [facility]!!         Signed, Candee Furbish, MD  09/27/2019 9:00 AM    [place]

## 2019-10-18 DIAGNOSIS — H2513 Age-related nuclear cataract, bilateral: Secondary | ICD-10-CM | POA: Diagnosis not present

## 2019-11-02 DIAGNOSIS — L603 Nail dystrophy: Secondary | ICD-10-CM | POA: Diagnosis not present

## 2019-11-02 DIAGNOSIS — Z85828 Personal history of other malignant neoplasm of skin: Secondary | ICD-10-CM | POA: Diagnosis not present

## 2019-11-02 DIAGNOSIS — L570 Actinic keratosis: Secondary | ICD-10-CM | POA: Diagnosis not present

## 2019-12-09 ENCOUNTER — Ambulatory Visit: Payer: Medicare Other | Attending: Internal Medicine

## 2019-12-09 DIAGNOSIS — Z23 Encounter for immunization: Secondary | ICD-10-CM | POA: Diagnosis not present

## 2019-12-09 NOTE — Progress Notes (Signed)
.  cobidobs

## 2019-12-09 NOTE — Progress Notes (Signed)
   Covid-19 Vaccination Clinic  Name:  David Wright    MRN: [id number] DOB: 31-May-1946  12/09/2019  Mr. David Wright was observed post Covid-19 immunization for 30 minutes based on pre-vaccination screening without incidence. He was provided with Vaccine Information Sheet and instruction to access the V-Safe system.   Mr. David Wright was instructed to call 911 with any severe reactions post vaccine: Marland Kitchen Difficulty breathing  . Swelling of your face and throat  . A fast heartbeat  . A bad rash all over your body  . Dizziness and weakness    Immunizations Administered    Name Date Dose VIS Date Route   Pfizer COVID-19 Vaccine 12/09/2019  8:20 AM 0.3 mL 11/05/2019 Intramuscular   Manufacturer: [employer], [employer]   Lot: [id number]   [place]: [id number]

## 2019-12-27 ENCOUNTER — Ambulatory Visit: Payer: Medicare Other | Attending: Internal Medicine

## 2019-12-27 ENCOUNTER — Ambulatory Visit: Payer: Medicare Other

## 2019-12-27 DIAGNOSIS — Z23 Encounter for immunization: Secondary | ICD-10-CM

## 2019-12-27 NOTE — Progress Notes (Signed)
   Covid-19 Vaccination Clinic  Name:  David Wright    MRN: [id number] DOB: [birth year]  12/27/2019  [name] was observed post Covid-19 immunization for 15 minutes without incidence. He was provided with Vaccine Information Sheet and instruction to access the V-Safe system.   [name] was instructed to call 911 with any severe reactions post vaccine: Marland Kitchen Difficulty breathing  . Swelling of your face and throat  . A fast heartbeat  . A bad rash all over your body  . Dizziness and weakness    Immunizations Administered    Name Date Dose VIS Date Route   Pfizer COVID-19 Vaccine 12/27/2019  8:20 AM 0.3 mL 11/05/2019 Intramuscular   Manufacturer: [place]   Lot: [id number]   [place]: [id number]

## 2020-08-09 ENCOUNTER — Other Ambulatory Visit: Payer: Self-pay | Admitting: Cardiology

## 2020-08-09 DIAGNOSIS — I480 Paroxysmal atrial fibrillation: Secondary | ICD-10-CM

## 2020-08-09 NOTE — Telephone Encounter (Signed)
Prescription refill request for Eliquis received.  Last office visit: Skains, 09/27/2019 Scr: 0.95, 07/19/2019 Age: 74 y.o. Weight: 74.6 kg    Pt overdue for blood work, pt has an appointment see Dr. Marlou Porch on 09/26/2020. Scheduled pt to get blood work when he sees Dr. Marlou Porch on 09/26/2020

## 2020-09-09 ENCOUNTER — Ambulatory Visit: Payer: Medicare Other | Attending: Internal Medicine

## 2020-09-09 ENCOUNTER — Other Ambulatory Visit: Payer: Self-pay

## 2020-09-09 DIAGNOSIS — Z23 Encounter for immunization: Secondary | ICD-10-CM

## 2020-09-09 NOTE — Progress Notes (Signed)
   Covid-19 Vaccination Clinic  Name:  David Wright    MRN: [id number] DOB: 11-02-1946  09/09/2020  David Wright was observed post Covid-19 immunization for 15 minutes without incident. He was provided with Vaccine Information Sheet and instruction to access the V-Safe system.   David Wright was instructed to call 911 with any severe reactions post vaccine: Marland Kitchen Difficulty breathing  . Swelling of face and throat  . A fast heartbeat  . A bad rash all over body  . Dizziness and weakness

## 2020-09-26 ENCOUNTER — Encounter: Payer: Self-pay | Admitting: Cardiology

## 2020-09-26 ENCOUNTER — Other Ambulatory Visit: Payer: Self-pay

## 2020-09-26 ENCOUNTER — Ambulatory Visit (INDEPENDENT_AMBULATORY_CARE_PROVIDER_SITE_OTHER): Payer: Medicare Other | Admitting: Cardiology

## 2020-09-26 ENCOUNTER — Other Ambulatory Visit: Payer: Medicare Other | Admitting: *Deleted

## 2020-09-26 VITALS — BP 112/60 | HR 66 | Ht 69.0 in | Wt 164.0 lb

## 2020-09-26 DIAGNOSIS — I480 Paroxysmal atrial fibrillation: Secondary | ICD-10-CM | POA: Diagnosis not present

## 2020-09-26 DIAGNOSIS — E7800 Pure hypercholesterolemia, unspecified: Secondary | ICD-10-CM | POA: Diagnosis not present

## 2020-09-26 DIAGNOSIS — Z7901 Long term (current) use of anticoagulants: Secondary | ICD-10-CM | POA: Diagnosis not present

## 2020-09-26 MED ORDER — FLECAINIDE ACETATE 50 MG PO TABS
50.0000 mg | ORAL_TABLET | Freq: Two times a day (BID) | ORAL | 3 refills | Status: DC
Start: 2020-09-26 — End: 2021-07-24

## 2020-09-26 MED ORDER — METOPROLOL SUCCINATE ER 25 MG PO TB24
ORAL_TABLET | ORAL | 3 refills | Status: DC
Start: 2020-09-26 — End: 2020-12-04

## 2020-09-26 MED ORDER — APIXABAN 5 MG PO TABS
5.0000 mg | ORAL_TABLET | Freq: Two times a day (BID) | ORAL | 3 refills | Status: DC
Start: 2020-09-26 — End: 2021-07-24

## 2020-09-26 NOTE — Patient Instructions (Signed)
Medication Instructions:  The current medical regimen is effective;  continue present plan and medications.  *If you need a refill on your cardiac medications before your next appointment, please call your pharmacy*  Follow-Up: At [facility], you and your health needs are our priority.  As part of our continuing mission to provide you with exceptional heart care, we have created designated Provider Care Teams.  These Care Teams include your primary Cardiologist (physician) and Advanced Practice Providers (APPs -  Physician Assistants and Nurse Practitioners) who all work together to provide you with the care you need, when you need it.  We recommend signing up for the patient portal called "MyChart".  Sign up information is provided on this After Visit Summary.  MyChart is used to connect with patients for Virtual Visits (Telemedicine).  Patients are able to view lab/test results, encounter notes, upcoming appointments, etc.  Non-urgent messages can be sent to your provider as well.   To learn more about what you can do with MyChart, go to [email or web address].    Your next appointment:   1 year(s)  The format for your next appointment:   In Person  Provider:   Mark Skains, MD   Thank you for choosing [facility]!!    

## 2020-09-26 NOTE — Progress Notes (Signed)
Cardiology Office Note:    Date:  09/26/2020   ID:  David Wright, DOB [birth year], MRN [id number]  PCP:  David Carol, MD  [facility] Cardiologist:  David Furbish, MD  David Wright:  None   Referring MD: David Carol, MD     History of Present Illness:    David Wright is a 74 y.o. male here for the follow-up of paroxysmal atrial fibrillation.  Has a very low PAF burden. Previously about 3 times over the past year.  In fact, he just had 1 episode recently, woke up, took an additional flecanide and it broke.  Usually is able to terminate this by taking an extra flecainide.  Doing well on the Eliquis with minor bruising.  He did ask about intense arm pain that he will occasionally get that drops into his knees.  He raises his arm after some numbness and pain occurs and usually it goes away.  He had a stress test done in 2018 that was low risk with no ischemia.  His wife, David Wright, accompanied him today.  She will speak to Dr. Delfina Redwood about referral to me.  I would be happy to see her.  She had a SVT ablation that may not have been successful she states.  And maybe a prolonged QT.  Past Medical History:  Diagnosis Date   Anginal pain ([place])    has been worked-up but all neg.   Arthritis    Asthma    well controlled   Atrial tachycardia, paroxysmal (HCC)    Complication of anesthesia 40 yrs.ago   spinal headache   Dysrhythmia    A-Fibrillation   Headache    Hypercholesteremia    pt reports that his cholesterol is normal   PAC (premature atrial contraction)    Paroxysmal atrial fibrillation (HCC)    Snores    Spinal headache    40 years ago    Past Surgical History:  Procedure Laterality Date   COLONOSCOPY WITH PROPOFOL N/A 09/23/2016   Procedure: COLONOSCOPY WITH PROPOFOL;  Surgeon: Garlan Fair, MD;  [facility];  Service: Endoscopy;  Laterality: N/A;   EYE SURGERY     age 41   INGUINAL HERNIA REPAIR Right  12/04/2016   Procedure: LAPAROSCOPIC RIGHT INGUINAL HERNIA WITH MESH;  Surgeon: Coralie Keens, MD;  Location: [place];  Service: General;  Laterality: Right;   INSERTION OF MESH Right 12/04/2016   Procedure: INSERTION OF MESH;  Surgeon: Coralie Keens, MD;  Location: [place];  Service: General;  Laterality: Right;   JOINT REPLACEMENT     perianal fistula     had spinal anesthesia and had spinal headache after surgery, 40 years ago   [place]  10/27/2012   RIGHT HIP    Current Medications: Current Meds  Medication Sig   alclomethasone (ACLOVATE) 0.05 % cream as needed.     Allergies:   No known allergies   Social History   Socioeconomic History   Marital status: Married    Spouse name: Not on file   Number of children: Not on file   Years of education: Not on file   Highest education level: Not on file  Occupational History   Not on file  Tobacco Use   Smoking status: Never Smoker   Smokeless tobacco: Never Used  Vaping Use   Vaping Use: Never used  Substance and Sexual Activity   Alcohol use: Yes    Alcohol/week: 0.0 standard drinks  Comment: occasionally a drink per week or less   Drug use: No   Sexual activity: Not on file  Other Topics Concern   Not on file  Social History Narrative   Pt lives in [place] with spouse.   Retired Nature conservation officer for large [place] Strain:    Difficulty of Paying Living Expenses: Not on [employer] Insecurity:    Worried About Charity fundraiser in the Last Year: Not on file   [place] in the Last Year: Not on file  Transportation Needs:    Film/video editor (Medical): Not on file   Lack of Transportation (Non-Medical): Not on file  Physical Activity:    Days of Exercise per Week: Not on file   Minutes of Exercise per Session: Not on file  Stress:    Feeling of Stress : Not on file    Social Connections:    Frequency of Communication with Friends and Family: Not on file   Frequency of Social Gatherings with Friends and Family: Not on file   Attends Religious Services: Not on file   Active Member of Clubs or Organizations: Not on file   Attends Archivist Meetings: Not on file   Marital Status: Not on file     Family History: The patient's family history includes Heart attack in his father and mother; Heart disease in his father and mother; Pancreatic cancer in his sister.  ROS:   Please see the history of present illness.    No bleeding issues no syncope no chest pain all other systems reviewed and are negative.  EKGs/Labs/Other Studies Reviewed:    The following studies were reviewed today:  Nuclear stress test low risk echo normal EF   EKG:  EKG is sinus rhythm 66 no other abnormalities.   Recent Labs: No results found for requested labs within last 8760 hours.  Recent Lipid Panel    Component Value Date/Time   CHOL (H) 01/24/2011 1714    232        ATP III CLASSIFICATION:  <200     mg/dL   Desirable  200-239  mg/dL   Borderline High  >=240    mg/dL   High          TRIG 172 (H) 01/24/2011 1714   HDL 58 01/24/2011 1714   CHOLHDL 4.0 01/24/2011 1714   VLDL 34 01/24/2011 1714   LDLCALC (H) 01/24/2011 1714    140        Total Cholesterol/HDL:CHD Risk Coronary Heart Disease Risk Table                     Men   Women  1/2 Average Risk   3.4   3.3  Average Risk       5.0   4.4  2 X Average Risk   9.6   7.1  3 X Average Risk  23.4   11.0        Use the calculated Patient Ratio above and the CHD Risk Table to determine the patient's CHD Risk.        ATP III CLASSIFICATION (LDL):  <100     mg/dL   Optimal  100-129  mg/dL   Near or Above                    Optimal  130-159  mg/dL   Borderline  160-189  mg/dL   High  >190     mg/dL   Very High     Physical Exam:    VS:  BP 112/60    Pulse 66    Ht 5\' 9"  (1.753 m)    Wt 164  lb (74.4 kg)    SpO2 95%    BMI 24.22 kg/m     Wt Readings from Last 3 Encounters:  09/26/20 164 lb (74.4 kg)  09/27/19 164 lb 6.4 oz (74.6 kg)  03/17/19 162 lb (73.5 kg)     GEN:  Well nourished, well developed in no acute distress HEENT: Normal NECK: No JVD; No carotid bruits LYMPHATICS: No lymphadenopathy CARDIAC: RRR, no murmurs, rubs, gallops RESPIRATORY:  Clear to auscultation without rales, wheezing or rhonchi  ABDOMEN: Soft, non-tender, non-distended MUSCULOSKELETAL:  No edema; No deformity  SKIN: Warm and dry NEUROLOGIC:  Alert and oriented x 3 PSYCHIATRIC:  Normal affect   ASSESSMENT:    1. Paroxysmal atrial fibrillation (HCC)   2. Chronic anticoagulation   3. Hypercholesteremia    PLAN:    In order of problems listed above:  Physical atrial fibrillation -Symptomatic but low burden. Had 1 Tues. At night. Sleep, wakes up takes flecainide -Continue with flecainide, and take an occasional extra flecainide if necessary.  Rare. -Has seen Dr. Rayann Heman with EP in the past. If he does have worsening symptoms, ablative therapy No changes made. -Continue with Toprol-XL.  Chronic anticoagulation -Continue with Eliquis.  Had blood work done this morning.  We will follow up.  CBC, basic metabolic profile.  Hyperlipidemia -Prior LDL 133.   Medication Adjustments/Labs and Tests Ordered: Current medicines are reviewed at length with the patient today.  Concerns regarding medicines are outlined above.  Orders Placed This Encounter  Procedures   EKG 12-Lead   Meds ordered this encounter  Medications   metoprolol succinate (TOPROL XL) 25 MG 24 hr tablet    Sig: TAKE ONE-HALF (1/2) TABLET AT BEDTIME    Dispense:  45 tablet    Refill:  3   apixaban (ELIQUIS) 5 MG TABS tablet    Sig: Take 1 tablet (5 mg total) by mouth 2 (two) times daily.    Dispense:  180 tablet    Refill:  3   flecainide (TAMBOCOR) 50 MG tablet    Sig: Take 1 tablet (50 mg total) by mouth 2  (two) times daily.    Dispense:  180 tablet    Refill:  3    Patient Instructions  Medication Instructions:  The current medical regimen is effective;  continue present plan and medications.  *If you need a refill on your cardiac medications before your next appointment, please call your pharmacy*  Follow-Up: At [facility], you and your health needs are our priority.  As part of our continuing mission to provide you with exceptional heart care, we have created designated Provider Care Teams.  These Care Teams include your primary Cardiologist (physician) and Advanced Practice Providers (APPs -  Physician Assistants and Nurse Practitioners) who all work together to provide you with the care you need, when you need it.  We recommend signing up for the patient portal called "MyChart".  Sign up information is provided on this After Visit Summary.  MyChart is used to connect with patients for Virtual Visits (Telemedicine).  Patients are able to view lab/test results, encounter notes, upcoming appointments, etc.  Non-urgent messages can be sent to your provider as well.   To learn more  about what you can do with MyChart, go to [email or web address].    Your next appointment:   1 year(s)  The format for your next appointment:   In Person  Provider:   Candee Furbish, MD   Thank you for choosing [facility]!!        Signed, David Furbish, MD  09/26/2020 5:05 PM    [place]

## 2020-09-27 LAB — CBC
Hematocrit: 45.4 % (ref 37.5–51.0)
Hemoglobin: 15.9 g/dL (ref 13.0–17.7)
MCH: 32.3 pg (ref 26.6–33.0)
MCHC: 35 g/dL (ref 31.5–35.7)
MCV: 92 fL (ref 79–97)
Platelets: 244 10*3/uL (ref 150–450)
RBC: 4.92 x10E6/uL (ref 4.14–5.80)
RDW: 12.5 % (ref 11.6–15.4)
WBC: 6.7 10*3/uL (ref 3.4–10.8)

## 2020-09-27 LAB — BASIC METABOLIC PANEL
BUN/Creatinine Ratio: 19 (ref 10–24)
BUN: 20 mg/dL (ref 8–27)
CO2: 25 mmol/L (ref 20–29)
Calcium: 9.4 mg/dL (ref 8.6–10.2)
Chloride: 101 mmol/L (ref 96–106)
Creatinine, Ser: 1.05 mg/dL (ref 0.76–1.27)
GFR calc Af Amer: 79 mL/min/{1.73_m2} (ref >59)
GFR calc non Af Amer: 69 mL/min/{1.73_m2} (ref >59)
Glucose: 92 mg/dL (ref 65–99)
Potassium: 4.5 mmol/L (ref 3.5–5.2)
Sodium: 141 mmol/L (ref 134–144)

## 2020-10-05 DIAGNOSIS — H2513 Age-related nuclear cataract, bilateral: Secondary | ICD-10-CM | POA: Diagnosis not present

## 2020-10-09 DIAGNOSIS — J4530 Mild persistent asthma, uncomplicated: Secondary | ICD-10-CM | POA: Diagnosis not present

## 2020-10-09 DIAGNOSIS — Z91013 Allergy to seafood: Secondary | ICD-10-CM | POA: Diagnosis not present

## 2020-10-09 DIAGNOSIS — J301 Allergic rhinitis due to pollen: Secondary | ICD-10-CM | POA: Diagnosis not present

## 2020-10-09 DIAGNOSIS — J3089 Other allergic rhinitis: Secondary | ICD-10-CM | POA: Diagnosis not present

## 2020-12-04 ENCOUNTER — Telehealth: Payer: Self-pay | Admitting: Cardiology

## 2020-12-04 ENCOUNTER — Other Ambulatory Visit: Payer: Self-pay | Admitting: Cardiology

## 2020-12-04 DIAGNOSIS — Z1152 Encounter for screening for COVID-19: Secondary | ICD-10-CM | POA: Diagnosis not present

## 2020-12-04 NOTE — Telephone Encounter (Signed)
*  STAT* If patient is at the pharmacy, call can be transferred to refill team.   1. Which medications need to be refilled? (please list name of each medication and dose if known) metoprolol succinate (TOPROL-XL) 25 MG 24 hr tablet  2. Which pharmacy/location (including street and city if local pharmacy) is medication to be sent to? [facility] - [place], [place] - [address]  3. Do they need a 30 day or 90 day supply? 90 day  

## 2020-12-04 NOTE — Telephone Encounter (Signed)
Called pt to inform him that his medication was already sent to his pharmacy as requested. I advised the pt that if he has any other problems, questions or concerns, to give our office a call. Pt verbalized understanding.  °

## 2020-12-04 NOTE — Telephone Encounter (Signed)
Called [facility] and they stated that pt does have an Rx and that his medication metoprolol is scheduled to ship out on 12/06/20 and that he also has refills. I called the pt as well to inform him of this and pt verbalized understanding.

## 2020-12-04 NOTE — Telephone Encounter (Signed)
Patient called, [facility] and they never received the prescription.

## 2021-05-15 ENCOUNTER — Other Ambulatory Visit: Payer: Self-pay | Admitting: Internal Medicine

## 2021-05-15 ENCOUNTER — Ambulatory Visit
Admission: RE | Admit: 2021-05-15 | Discharge: 2021-05-15 | Disposition: A | Discharge status: Home / Self Care | Payer: Medicare Other | Source: Ambulatory Visit | Attending: Internal Medicine | Admitting: Internal Medicine

## 2021-05-15 DIAGNOSIS — M47892 Other spondylosis, cervical region: Secondary | ICD-10-CM | POA: Diagnosis not present

## 2021-05-15 DIAGNOSIS — M47812 Spondylosis without myelopathy or radiculopathy, cervical region: Secondary | ICD-10-CM | POA: Diagnosis not present

## 2021-05-15 DIAGNOSIS — M4319 Spondylolisthesis, multiple sites in spine: Secondary | ICD-10-CM | POA: Diagnosis not present

## 2021-05-18 ENCOUNTER — Other Ambulatory Visit: Payer: Self-pay | Admitting: Internal Medicine

## 2021-05-18 ENCOUNTER — Ambulatory Visit
Admission: RE | Admit: 2021-05-18 | Discharge: 2021-05-18 | Disposition: A | Discharge status: Home / Self Care | Payer: Medicare Other | Source: Ambulatory Visit | Attending: Internal Medicine | Admitting: Internal Medicine

## 2021-05-18 DIAGNOSIS — R918 Other nonspecific abnormal finding of lung field: Secondary | ICD-10-CM | POA: Diagnosis not present

## 2021-05-18 DIAGNOSIS — R0989 Other specified symptoms and signs involving the circulatory and respiratory systems: Secondary | ICD-10-CM | POA: Diagnosis not present

## 2021-05-18 DIAGNOSIS — I10 Essential (primary) hypertension: Secondary | ICD-10-CM | POA: Diagnosis not present

## 2021-05-18 DIAGNOSIS — I6523 Occlusion and stenosis of bilateral carotid arteries: Secondary | ICD-10-CM | POA: Diagnosis not present

## 2021-05-25 ENCOUNTER — Other Ambulatory Visit: Payer: Self-pay

## 2021-05-25 ENCOUNTER — Encounter: Payer: Self-pay | Admitting: Physical Therapy

## 2021-05-25 ENCOUNTER — Ambulatory Visit: Payer: Medicare Other | Attending: Internal Medicine | Admitting: Physical Therapy

## 2021-05-25 DIAGNOSIS — R252 Cramp and spasm: Secondary | ICD-10-CM | POA: Diagnosis not present

## 2021-05-25 DIAGNOSIS — M542 Cervicalgia: Secondary | ICD-10-CM | POA: Insufficient documentation

## 2021-05-25 DIAGNOSIS — R293 Abnormal posture: Secondary | ICD-10-CM | POA: Diagnosis not present

## 2021-05-25 NOTE — Therapy (Signed)
[facility] [address] [place], [place], [address] Phone: [phone]   Fax:  [phone]  Physical Therapy Evaluation  Patient Details  Name: David Wright MRN: [id number] Date of Birth: [birth year] Referring Provider (PT): Seward Carol, MD   Encounter Date: 05/25/2021   PT End of Session - 05/25/21 0803     Visit Number 1    Date for PT Re-Evaluation 07/20/21    Authorization Type Medicare Part A and B - KX after 15 visits    Progress Note Due on Visit 10    PT Start Time 0803    PT Stop Time 0842    PT Time Calculation (min) 39 min    Activity Tolerance Patient tolerated treatment well    Behavior During Therapy [facility] for tasks assessed/performed             Past Medical History:  Diagnosis Date   Anginal pain ([place])    has been worked-up but all neg.   Arthritis    Asthma    well controlled   Atrial tachycardia, paroxysmal (HCC)    Complication of anesthesia 40 yrs.ago   spinal headache   Dysrhythmia    A-Fibrillation   Headache    Hypercholesteremia    pt reports that his cholesterol is normal   PAC (premature atrial contraction)    Paroxysmal atrial fibrillation (HCC)    Snores    Spinal headache    40 years ago    Past Surgical History:  Procedure Laterality Date   COLONOSCOPY WITH PROPOFOL N/A 09/23/2016   Procedure: COLONOSCOPY WITH PROPOFOL;  Surgeon: Garlan Fair, MD;  [facility];  Service: Endoscopy;  Laterality: N/A;   EYE SURGERY     age 65   INGUINAL HERNIA REPAIR Right 12/04/2016   Procedure: LAPAROSCOPIC RIGHT INGUINAL HERNIA WITH MESH;  Surgeon: Coralie Keens, MD;  Location: [place];  Service: General;  Laterality: Right;   INSERTION OF MESH Right 12/04/2016   Procedure: INSERTION OF MESH;  Surgeon: Coralie Keens, MD;  Location: [place];  Service: General;  Laterality: Right;   JOINT REPLACEMENT     perianal fistula     had spinal anesthesia and had spinal  headache after surgery, 40 years ago   [name]  10/27/2012   RIGHT HIP    There were no vitals filed for this visit.    Subjective Assessment - 05/25/21 0801     Subjective Pt is a 75yo male referred to OPPT with history of neck pain and limited ROM especially into Rt rotation.  He has started to notice headaches as well.  Symptoms have been present for years but he has history of acute flare ups/lock ups in neck.  Pt has finished a Medrol dose pack last week which helped temporarily but back to baseline this week.  He has had PT in the past for the same problem.  Mechanical traction, massage and exercise helped but DN not as much in the past.    Diagnostic tests xrays 2022: severe degen C5/6, C6/7    Patient Stated Goals increase ROM, reduce headaches (daily), reduce pain, sleep improved    Currently in Pain? 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, cervical region    Referring Provider (PT) Seward Carol, MD    Onset Date/Surgical Date --   3 mos this episode, chronic condition with acute flare ups   Hand Dominance Right    Next MD Visit as needed    Prior Therapy yes      Precautions   Precautions None      Balance Screen   Has the patient fallen in the past 6 months No      [place] residence    Living Arrangements Spouse/significant other     Type of [place]      Prior Function   Level of [place] Retired    Leisure garden, yard work      Observation/Other Assessments   Focus on Therapeutic Outcomes (FOTO)  46% goal 60%      Posture/Postural Control   Posture/Postural Control Postural limitations    Postural Limitations Increased thoracic kyphosis;Forward head      ROM / Strength   AROM / PROM / Strength AROM;Strength      AROM   Overall AROM Comments bil shoulders grossly WNL    AROM Assessment Site Cervical    Cervical Flexion 20    Cervical Extension 20    Cervical - Right Side Bend 13    Cervical - Left Side Bend 16    Cervical - Right Rotation 20    Cervical - Left Rotation 25      Strength   Overall Strength Comments grip strength 85 Rt, 87 Lt, bil UEs grossly 5/5    Strength Assessment Site Cervical    Cervical Flexion 5/5    Cervical Extension 5/5    Cervical - Right Side Bend 5/5    Cervical - Left Side Bend 5/5    Cervical - Right Rotation 5/5    Cervical - Left Rotation 5/5      Flexibility   Soft Tissue Assessment /Muscle Length yes   see palpation     Palpation   Spinal mobility poor upper cervical nodding, limited U-joint sideglides bil throughout c-spine, limited upglides C5-C7, limited thoracic PAs T1-T4, limited 1st rib depression on Rt    Palpation comment signif tenderness present: Rt>Lt upper trap with TP present, bil SO, cervical paraspinals, scalenes, SCM, cervical multifidi C5-T1, Rt first rib (anterior palpation)      Special Tests    Special Tests Cervical    Cervical Tests Dictraction      Distraction Test   Findngs Positive    Comment relief in neutral spine, pain in flexion                        Objective measurements completed on examination: See above findings.               PT Education - 05/25/21 6314     Education Details Access Code:  [email or web address]    Person(s) Educated Patient    Methods  Explanation;Handout;Demonstration    Comprehension Verbalized understanding;Returned demonstration              PT Short Term Goals - 05/25/21 1206       PT SHORT TERM GOAL #1   Title Pt will be ind with initial HEP    Time 4    Period Weeks    Status New    Target Date 06/22/21      PT SHORT TERM GOAL #2   Title Pt will achieve bil rotation, flexion and ext to at least 35 deg.    Baseline ROM 20-25 deg all planes with pain    Time 4    Period Weeks    Status New    Target Date 06/22/21      PT SHORT TERM GOAL #3   Title Improve FOTO score by at least 7 points to >/= 53%    Baseline 46%    Time 4    Period Weeks    Status New    Target Date 06/22/21      PT SHORT TERM GOAL #4   Title Pt will report improved sleep and reduced experience of headaches by at least 25%    Time 4    Period Weeks    Status New    Target Date 06/22/21               PT Long Term Goals - 05/25/21 1208       PT LONG TERM GOAL #1   Title Pt will be ind with advanced HEP and understand how to protect neck from future flare ups.    Time 8    Period Weeks    Status New    Target Date 07/20/21      PT LONG TERM GOAL #2   Title Pt will improve bil cervical rotation to at least 40 deg with less pain and greater ease for improved driving visibility    Baseline 20-25 deg w/ pain    Time 8    Period Weeks    Status New    Target Date 07/20/21      PT LONG TERM GOAL #3   Title Pt will report improved sleep by at least 70%    Time 8    Period Weeks    Status New    Target Date 07/20/21      PT LONG TERM GOAL #4   Title Improved neck pain with daily activities by at least 50%    Time 8    Period Weeks    Status New    Target Date 07/20/21      PT LONG TERM GOAL #5   Title FOTO score improved to at least 60% to demo improved function.    Baseline 46%    Time 8    Period Weeks    Status New    Target Date 07/20/21                    Plan - 05/25/21 0839      Clinical Impression Statement Pt is a pleasant 75yo male with history of symptoms related to OA of cervical spine with muscle spasms and signif loss of A/ROM.  He has had symptoms for years but with acute flare ups which have benefitted from PT including mechanical traction, massage and ther ex in the past.  Current episode of pain started 3  mos ago and is worsening.  Pain ranges from 5-9/10 and includes daily headaches and loss of sleep due to pain.  He has trouble with driving visibility.  Pt has signif limitations and pain with cervical spine A/ROM in all planes (range is 20-25 deg). First rib is elevated with signif spasm and TPs Rt>Lt of cervical soft tissues.  Pt has limited joint mobility in upper c-spine, lower c-spine facets bil, and upper thoracic PAs.  Pt reports relief with manual traction in neutral, but pain in slight flexion.  Tenderness is present Rt>Lt upper trap, SCM, scalenes, SO, cervical paraspinals and cervical multifidi C5-T1.  Pt reported increased pain with assessment today.  PT started HEP for gentle mobility within pain-free range and scapular activation.  Pt will benefit from skilled PT to address pain and limitations to improve QOL and maximize function.    Personal Factors and Comorbidities Time since onset of injury/illness/exacerbation;Age    Examination-Activity Limitations Sleep;Carry;Lift    Examination-Participation Restrictions Driving;Yard Work;Community Activity    Stability/Clinical Decision Making Stable/Uncomplicated    Clinical Decision Making Low    Rehab Potential Good    PT Frequency 2x / week    PT Duration 8 weeks    PT Treatment/Interventions Electrical Stimulation;Moist Heat;Traction;Cryotherapy;Dry needling;Manual techniques;ADLs/Self Care Home Management;Therapeutic exercise;Neuromuscular re-education;Functional mobility training;Therapeutic activities;Patient/family education;Passive range of motion;Spinal Manipulations;Joint Manipulations;Taping    PT  Next Visit Plan mechanical cervical traction in neutral (not flexed), DN cervical (SO, upper traps, cervical and upper thoracic multifidi), heat/stim    PT Home Exercise Plan Access Code: [email or web address]    Consulted and Agree with Plan of Care Patient             Patient will benefit from skilled therapeutic intervention in order to improve the following deficits and impairments:  Decreased range of motion, Increased muscle spasms, Decreased endurance, Decreased activity tolerance, Pain, Hypomobility, Postural dysfunction, Decreased mobility  Visit Diagnosis: Cervicalgia - Plan: PT plan of care cert/re-cert  Abnormal posture - Plan: PT plan of care cert/re-cert  Cramp and spasm - Plan: PT plan of care cert/re-cert     Problem List Patient Active Problem List   Diagnosis Date Noted   Ear itching 11/30/2018   Snoring 04/29/2016   Atrial tachycardia, paroxysmal (HCC)    Paroxysmal atrial fibrillation (HCC)    Asthma    Hypercholesteremia    PAC (premature atrial contraction)    Acute blood loss anemia 10/28/2012    Class: Acute   DJD (degenerative joint disease) of hip 10/27/2012    Class: Chronic    Brenn Gatton, PT 05/25/21 12:14 PM   [facility] [address], [place], [place], [address] Phone: [phone]   Fax:  [phone]  Name: Adien Kimmel MRN: [id number] Date of Birth: 1946-10-12

## 2021-05-25 NOTE — Patient Instructions (Signed)
Access Code: [email or web address] URL: [email or web address] Date: 05/25/2021 Prepared by: Venetia Night Felicha Frayne  Exercises Supine Cervical Rotation AROM on Pillow - 3 x daily - 7 x weekly - 1 sets - 10 reps Seated Cervical Rotation AROM - 3 x daily - 7 x weekly - 1 sets - 10 reps Seated Scapular Retraction - 3 x daily - 7 x weekly - 1 sets - 10 reps Standing Backward Shoulder Rolls - 3 x daily - 7 x weekly - 1 sets - 10 reps

## 2021-05-29 ENCOUNTER — Ambulatory Visit: Payer: Medicare Other | Admitting: Physical Therapy

## 2021-05-29 ENCOUNTER — Other Ambulatory Visit: Payer: Self-pay

## 2021-05-29 DIAGNOSIS — R293 Abnormal posture: Secondary | ICD-10-CM | POA: Diagnosis not present

## 2021-05-29 DIAGNOSIS — R252 Cramp and spasm: Secondary | ICD-10-CM

## 2021-05-29 DIAGNOSIS — M542 Cervicalgia: Secondary | ICD-10-CM | POA: Diagnosis not present

## 2021-05-29 NOTE — Patient Instructions (Signed)
Supine Chin Tuck - 1 x daily - 7 x weekly - 1 sets - 10 reps - 3s hold Seated Assisted Cervical Rotation with Towel - 3 x daily - 7 x weekly - 1 sets - 3 reps - 10s hold

## 2021-05-29 NOTE — Therapy (Signed)
[facility] [address], [place], [place], [address] Phone: [phone]   Fax:  [phone]  Physical Therapy Treatment  Patient Details  Name: David Wright MRN: [id number] Date of Birth: 1946-07-26 Referring Provider (PT): Seward Carol, MD   Encounter Date: 05/29/2021   PT End of Session - 05/29/21 1452     Visit Number 2    Date for PT Re-Evaluation 07/20/21    Authorization Type Medicare Part A and B - KX after 15 visits    Progress Note Due on Visit 10    PT Start Time 1400    PT Stop Time 1442    PT Time Calculation (min) 42 min    Activity Tolerance Patient tolerated treatment well    Behavior During Therapy [facility] for tasks assessed/performed             Past Medical History:  Diagnosis Date   Anginal pain ([place])    has been worked-up but all neg.   Arthritis    Asthma    well controlled   Atrial tachycardia, paroxysmal (HCC)    Complication of anesthesia 40 yrs.ago   spinal headache   Dysrhythmia    A-Fibrillation   Headache    Hypercholesteremia    pt reports that his cholesterol is normal   PAC (premature atrial contraction)    Paroxysmal atrial fibrillation (HCC)    Snores    Spinal headache    40 years ago    Past Surgical History:  Procedure Laterality Date   COLONOSCOPY WITH PROPOFOL N/A 09/23/2016   Procedure: COLONOSCOPY WITH PROPOFOL;  Surgeon: Garlan Fair, MD;  Location: [facility];  Service: Endoscopy;  Laterality: N/A;   EYE SURGERY     age 58   INGUINAL HERNIA REPAIR Right 12/04/2016   Procedure: LAPAROSCOPIC RIGHT INGUINAL HERNIA WITH MESH;  Surgeon: Coralie Keens, MD;  Location: [place];  Service: General;  Laterality: Right;   INSERTION OF MESH Right 12/04/2016   Procedure: INSERTION OF MESH;  Surgeon: Coralie Keens, MD;  Location: [place];  Service: General;  Laterality: Right;   JOINT REPLACEMENT     perianal fistula     had spinal anesthesia and had spinal  headache after surgery, 40 years ago   [place]  10/27/2012   RIGHT HIP    There were no vitals filed for this visit.   Subjective Assessment - 05/29/21 1404     Subjective Tried HEP. Has increased pain today.    Diagnostic tests xrays 2022: severe degen C5/6, C6/7    Patient Stated Goals increase ROM, reduce headaches (daily), reduce pain, sleep improved    Currently in Pain? Yes    Pain Score 5    10/10 with quick movement   Pain Location Neck    Pain Orientation Right    Pain Descriptors / Indicators Aching;Tightness;Pressure    Pain Type Chronic pain    Pain Onset More than a month ago                [facility] PT Assessment - 05/29/21 0001       AROM   Cervical - Right Side Bend 23    Cervical - Left Side Bend 27    Cervical - Right Rotation 46    Cervical - Left Rotation 55  [facility] Adult PT Treatment/Exercise - 05/29/21 0001       Exercises   Exercises Neck;Shoulder      Neck Exercises: Seated   Cervical Rotation Right;Left    Cervical Rotation Limitations AAROM with towel, 2 reps x 10s hold Rt/Lt      Neck Exercises: Supine   Neck Retraction 10 reps;3 secs    Cervical Rotation Both;10 reps      Shoulder Exercises: Seated   Other Seated Exercises scap squeeze, 10 reps x 5s hold      Shoulder Exercises: Standing   Other Standing Exercises wall clocks, x7 reps Rt/Lt, blue loop; cuing for decreased upper trap and levator activation      Manual Therapy   Manual Therapy Joint mobilization;Soft tissue mobilization;Manual Traction    Joint Mobilization PAs Grade 1-3 C2-7 for decreased pain and improved ROM    Soft tissue mobilization STM and tPR release Rt side scalenes, suboccipitals, upper trap, levator scap, cervical paraspinals    Manual Traction cervical traction with suboccipital release                    PT Education - 05/29/21 1441     Education Details supine chin  tuck, seated cervical rotation AAROM    Person(s) Educated Patient    Methods Explanation;Demonstration;Tactile cues;Verbal cues;Handout    Comprehension Verbalized understanding;Returned demonstration;Verbal cues required;Tactile cues required              PT Short Term Goals - 05/29/21 1444       PT SHORT TERM GOAL #1   Title Pt will be ind with initial HEP    Time 4    Period Weeks    Status On-going    Target Date 06/22/21               PT Long Term Goals - 05/25/21 1208       PT LONG TERM GOAL #1   Title Pt will be ind with advanced HEP and understand how to protect neck from future flare ups.    Time 8    Period Weeks    Status New    Target Date 07/20/21      PT LONG TERM GOAL #2   Title Pt will improve bil cervical rotation to at least 40 deg with less pain and greater ease for improved driving visibility    Baseline 20-25 deg w/ pain    Time 8    Period Weeks    Status New    Target Date 07/20/21      PT LONG TERM GOAL #3   Title Pt will report improved sleep by at least 70%    Time 8    Period Weeks    Status New    Target Date 07/20/21      PT LONG TERM GOAL #4   Title Improved neck pain with daily activities by at least 50%    Time 8    Period Weeks    Status New    Target Date 07/20/21      PT LONG TERM GOAL #5   Title FOTO score improved to at least 60% to demo improved function.    Baseline 46%    Time 8    Period Weeks    Status New    Target Date 07/20/21                   Plan - 05/29/21 1442  Clinical Impression Statement Patient reports decreased tightness of Rt side cervical at end of session. He demonstrates improved bilateral cervical rotation and lateral flexion AROM. Tactile cues provided for proper upper cervical flexor activation when performing supine chin tuck. Additional cuing provided for scapular depression and retraction when performing scapular squeezes. Would benefit from continued skilled  intervention to address impairments for decreased pain and improved quality of life.    Personal Factors and Comorbidities Time since onset of injury/illness/exacerbation;Age    Examination-Activity Limitations Sleep;Carry;Lift    Examination-Participation Restrictions Driving;Yard Work;Community Activity    Rehab Potential Good    PT Frequency 2x / week    PT Duration 8 weeks    PT Treatment/Interventions Electrical Stimulation;Moist Heat;Traction;Cryotherapy;Dry needling;Manual techniques;ADLs/Self Care Home Management;Therapeutic exercise;Neuromuscular re-education;Functional mobility training;Therapeutic activities;Patient/family education;Passive range of motion;Spinal Manipulations;Joint Manipulations;Taping    PT Next Visit Plan continue manual techniques, mechanical cervical traction    PT Home Exercise Plan Access Code: [email or web address]    Consulted and Agree with Plan of Care Patient             Patient will benefit from skilled therapeutic intervention in order to improve the following deficits and impairments:  Decreased range of motion, Increased muscle spasms, Decreased endurance, Decreased activity tolerance, Pain, Hypomobility, Postural dysfunction, Decreased mobility  Visit Diagnosis: Cervicalgia  Abnormal posture  Cramp and spasm     Problem List Patient Active Problem List   Diagnosis Date Noted   Ear itching 11/30/2018   Snoring 04/29/2016   Atrial tachycardia, paroxysmal (HCC)    Paroxysmal atrial fibrillation (HCC)    Asthma    Hypercholesteremia    PAC (premature atrial contraction)    Acute blood loss anemia 10/28/2012    Class: Acute   DJD (degenerative joint disease) of hip 10/27/2012    Class: Chronic    Everardo All PT, DPT  05/29/21 3:00 PM   [facility] [address] [place], [place], [address] Phone: [phone]   Fax:  [phone]  Name: David Wright MRN: [id number] Date of  Birth: 11-Jan-1946

## 2021-05-31 ENCOUNTER — Ambulatory Visit: Payer: Medicare Other | Admitting: Physical Therapy

## 2021-06-05 ENCOUNTER — Other Ambulatory Visit: Payer: Self-pay

## 2021-06-05 ENCOUNTER — Ambulatory Visit: Payer: Medicare Other | Admitting: Physical Therapy

## 2021-06-05 DIAGNOSIS — R293 Abnormal posture: Secondary | ICD-10-CM | POA: Diagnosis not present

## 2021-06-05 DIAGNOSIS — R252 Cramp and spasm: Secondary | ICD-10-CM

## 2021-06-05 DIAGNOSIS — M542 Cervicalgia: Secondary | ICD-10-CM

## 2021-06-05 NOTE — Therapy (Signed)
[facility] [address] [place], [place], [address] Phone: [phone]   Fax:  [phone]  Physical Therapy Treatment  Patient Details  Name: David Wright MRN: [id number] Date of Birth: 1946-05-13 Referring Provider (PT): Seward Carol, MD   Encounter Date: 06/05/2021   PT End of Session - 06/05/21 1858     Visit Number 3    Date for PT Re-Evaluation 07/20/21    Authorization Type Medicare Part A and B - KX after 15 visits    Progress Note Due on Visit 10    PT Start Time 6010    PT Stop Time 1225    PT Time Calculation (min) 40 min    Activity Tolerance Patient tolerated treatment well             Past Medical History:  Diagnosis Date   Anginal pain ([place])    has been worked-up but all neg.   Arthritis    Asthma    well controlled   Atrial tachycardia, paroxysmal (HCC)    Complication of anesthesia 40 yrs.ago   spinal headache   Dysrhythmia    A-Fibrillation   Headache    Hypercholesteremia    pt reports that his cholesterol is normal   PAC (premature atrial contraction)    Paroxysmal atrial fibrillation (HCC)    Snores    Spinal headache    40 years ago    Past Surgical History:  Procedure Laterality Date   COLONOSCOPY WITH PROPOFOL N/A 09/23/2016   Procedure: COLONOSCOPY WITH PROPOFOL;  Surgeon: Garlan Fair, MD;  [facility]: [facility];  Service: Endoscopy;  Laterality: N/A;   EYE SURGERY     age 74   INGUINAL HERNIA REPAIR Right 12/04/2016   Procedure: LAPAROSCOPIC RIGHT INGUINAL HERNIA WITH MESH;  Surgeon: Coralie Keens, MD;  Location: [facility];  Service: General;  Laterality: Right;   INSERTION OF MESH Right 12/04/2016   Procedure: INSERTION OF MESH;  Surgeon: Coralie Keens, MD;  Location: [place];  Service: General;  Laterality: Right;   JOINT REPLACEMENT     perianal fistula     had spinal anesthesia and had spinal headache after surgery, 40 years ago   [place]  10/27/2012   RIGHT HIP    There were no vitals filed for this visit.   Subjective Assessment - 06/05/21 1144     Subjective Sore the next day after therapy.  I've been doing my ex's at home.  Soreness increases at the end of the day.  Upper neck/mild headaches. Not too much in the mornings.    Diagnostic tests xrays 2022: severe degen C5/6, C6/7    Patient Stated Goals increase ROM, reduce headaches (daily), reduce pain, sleep improved    Currently in Pain? Yes    Pain Score 3     Pain Location Neck    Pain Orientation Right;Left    Pain Type Chronic pain                               [facility] Adult PT Treatment/Exercise - 06/05/21 0001       Self-Care   Self-Care Other Self-Care Comments    Other Self-Care Comments  discussion of use of home traction device he already has      Neck Exercises: Standing   Other Standing Exercises green band row and rotate 10x right and left with  head turn    Other Standing Exercises green band rows 20x; green band bil extensions 20x      Neck Exercises: Seated   Cervical Rotation Right;Left    Cervical Rotation Limitations AAROM with towel, 2 reps x 10s hold Rt/Lt; added opp arm stabilized on chair back; cue for towel angle    Other Seated Exercise --      Manual Therapy   Manual therapy comments suboccipital release; Addaday bil upper traps seated    Joint Mobilization PAs Grade 1-3 C2-7 for decreased pain and improved ROM    Soft tissue mobilization STM and tPR release Rt side scalenes, suboccipitals, upper trap, levator scap, cervical paraspinals    Manual Traction cervical traction with suboccipital release                      PT Short Term Goals - 05/29/21 1444       PT SHORT TERM GOAL #1   Title Pt will be ind with initial HEP    Time 4    Period Weeks    Status On-going    Target Date 06/22/21               PT Long Term Goals - 05/25/21 1208       PT LONG TERM  GOAL #1   Title Pt will be ind with advanced HEP and understand how to protect neck from future flare ups.    Time 8    Period Weeks    Status New    Target Date 07/20/21      PT LONG TERM GOAL #2   Title Pt will improve bil cervical rotation to at least 40 deg with less pain and greater ease for improved driving visibility    Baseline 20-25 deg w/ pain    Time 8    Period Weeks    Status New    Target Date 07/20/21      PT LONG TERM GOAL #3   Title Pt will report improved sleep by at least 70%    Time 8    Period Weeks    Status New    Target Date 07/20/21      PT LONG TERM GOAL #4   Title Improved neck pain with daily activities by at least 50%    Time 8    Period Weeks    Status New    Target Date 07/20/21      PT LONG TERM GOAL #5   Title FOTO score improved to at least 60% to demo improved function.    Baseline 46%    Time 8    Period Weeks    Status New    Target Date 07/20/21                   Plan - 06/05/21 1220     Clinical Impression Statement Primarily discomfort is with rotation.  Hypomobility lower and upper cervical regions.  Tender points in bil upper traps and shortened subocciptals which may be a contributing factor to headaches.  Corrected technique with cervical rotation SNAG and added cervical mobility ex's in standing using the green band.  Verbal cues for best technique and to monitor response with manual interventions.    Examination-Participation Restrictions Driving;Yard Work;Community Activity    Rehab Potential Good    PT Frequency 2x / week    PT Duration 8 weeks    PT Treatment/Interventions Electrical Stimulation;Moist Heat;Traction;Cryotherapy;Dry needling;Manual techniques;ADLs/Self  Care Home Management;Therapeutic exercise;Neuromuscular re-education;Functional mobility training;Therapeutic activities;Patient/family education;Passive range of motion;Spinal Manipulations;Joint Manipulations;Taping    PT Next Visit Plan continue  manual techniques, mechanical cervical traction (has home supine traction unit);  posture and cervical mobility ex's especially rotation;  wants to hold on doing DN    PT Home Exercise Plan Access Code: [email or web address]             Patient will benefit from skilled therapeutic intervention in order to improve the following deficits and impairments:  Decreased range of motion, Increased muscle spasms, Decreased endurance, Decreased activity tolerance, Pain, Hypomobility, Postural dysfunction, Decreased mobility  Visit Diagnosis: Cervicalgia  Abnormal posture  Cramp and spasm     Problem List Patient Active Problem List   Diagnosis Date Noted   Ear itching 11/30/2018   Snoring 04/29/2016   Atrial tachycardia, paroxysmal (HCC)    Paroxysmal atrial fibrillation (HCC)    Asthma    Hypercholesteremia    PAC (premature atrial contraction)    Acute blood loss anemia 10/28/2012    Class: Acute   DJD (degenerative joint disease) of hip 10/27/2012    Class: Chronic   Ruben Im, PT 06/05/21 7:18 PM Phone: [phone] Fax: [phone]  Alvera Singh 06/05/2021, 7:18 PM  [facility] [address], [place], [place], [address] Phone: [phone]   Fax:  [phone]  Name: David Wright MRN: [id number] Date of Birth: 05/06/1946

## 2021-06-05 NOTE — Patient Instructions (Signed)
Access Code: [email or web address] URL: [email or web address] Date: 06/05/2021 Prepared by: Ruben Im  Exercises Supine Cervical Rotation AROM on Pillow - 3 x daily - 7 x weekly - 1 sets - 10 reps Seated Cervical Rotation AROM - 3 x daily - 7 x weekly - 1 sets - 10 reps Seated Scapular Retraction - 3 x daily - 7 x weekly - 1 sets - 10 reps Standing Backward Shoulder Rolls - 3 x daily - 7 x weekly - 1 sets - 10 reps Supine Chin Tuck - 1 x daily - 7 x weekly - 1 sets - 10 reps - 3s hold Seated Assisted Cervical Rotation with Towel - 3 x daily - 7 x weekly - 1 sets - 3 reps - 10s hold PILATES ROW AND ROTATE - 1 x daily - 7 x weekly - 2 sets - 10 reps Standing Row with Anchored Resistance - 1 x daily - 7 x weekly - 2 sets - 10 reps Standing Shoulder Extension with Resistance - 1 x daily - 7 x weekly - 2 sets - 10 reps

## 2021-06-07 ENCOUNTER — Ambulatory Visit: Payer: Medicare Other | Admitting: Physical Therapy

## 2021-06-07 ENCOUNTER — Other Ambulatory Visit: Payer: Self-pay

## 2021-06-07 DIAGNOSIS — R293 Abnormal posture: Secondary | ICD-10-CM | POA: Diagnosis not present

## 2021-06-07 DIAGNOSIS — R252 Cramp and spasm: Secondary | ICD-10-CM | POA: Diagnosis not present

## 2021-06-07 DIAGNOSIS — M542 Cervicalgia: Secondary | ICD-10-CM

## 2021-06-07 NOTE — Therapy (Signed)
[facility] [address], [place], [place], [address] Phone: [phone]   Fax:  [phone]  Physical Therapy Treatment  Patient Details  Name: David Wright MRN: [id number] Date of Birth: 05-Sep-1946 Referring Provider (PT): Seward Carol, MD   Encounter Date: 06/07/2021   PT End of Session - 06/07/21 0930     Visit Number 4    Date for PT Re-Evaluation 07/20/21    Authorization Type Medicare Part A and B - KX after 15 visits    Progress Note Due on Visit 10    PT Start Time 0845    PT Stop Time 0927    PT Time Calculation (min) 42 min    Activity Tolerance Patient tolerated treatment well             Past Medical History:  Diagnosis Date   Anginal pain ([place])    has been worked-up but all neg.   Arthritis    Asthma    well controlled   Atrial tachycardia, paroxysmal (HCC)    Complication of anesthesia 40 yrs.ago   spinal headache   Dysrhythmia    A-Fibrillation   Headache    Hypercholesteremia    pt reports that his cholesterol is normal   PAC (premature atrial contraction)    Paroxysmal atrial fibrillation (HCC)    Snores    Spinal headache    40 years ago    Past Surgical History:  Procedure Laterality Date   COLONOSCOPY WITH PROPOFOL N/A 09/23/2016   Procedure: COLONOSCOPY WITH PROPOFOL;  Surgeon: Garlan Fair, MD;  [facility];  Service: Endoscopy;  Laterality: N/A;   EYE SURGERY     age 56   INGUINAL HERNIA REPAIR Right 12/04/2016   Procedure: LAPAROSCOPIC RIGHT INGUINAL HERNIA WITH MESH;  Surgeon: Coralie Keens, MD;  Location: [facility];  Service: General;  Laterality: Right;   INSERTION OF MESH Right 12/04/2016   Procedure: INSERTION OF MESH;  Surgeon: Coralie Keens, MD;  Location: [facility];  Service: General;  Laterality: Right;   JOINT REPLACEMENT     perianal fistula     had spinal anesthesia and had spinal headache after surgery, 40 years ago   [place]  10/27/2012   RIGHT HIP    There were no vitals filed for this visit.   Subjective Assessment - 06/07/21 0847     Subjective Not too bad.  Stiff in the AM.  Crunching with turning.  Doing ex's at home.  Liked the Addaday.  I started using my home (supine) cervical traction and I think that will help.    Patient Stated Goals increase ROM, reduce headaches (daily), reduce pain, sleep improved    Currently in Pain? Yes    Pain Score 2     Pain Location Neck                               [facility] Adult PT Treatment/Exercise - 06/07/21 0001       Neck Exercises: Standing   Neck Retraction Limitations bent row 10# 10x right/left with focus on slight chin tuck so ears in alignment over shoulders.    Other Standing Exercises green band horizontal abduction with emphasis on scap retraction 12x    Other Standing Exercises review of band rows vertical and horizontal      Neck Exercises: Prone   Other Prone Exercise quadruped  UE raises 10x     Manual Therapy   Manual therapy comments suboccipital release; Addaday bil upper traps seated    Joint Mobilization PAs Grade 1-3 C2-7 for decreased pain and improved ROM; lateral glides and rotation mobs C4-7    Soft tissue mobilization STM suboccipitals, upper trap, levator scap, cervical paraspinals                    PT Education - 06/07/21 0930     Education Details Bird dogs UE lifts; bent row with focus on head in alignment over shoulders    Person(s) Educated Patient    Methods Explanation;Demonstration;Handout    Comprehension Returned demonstration;Verbalized understanding              PT Short Term Goals - 05/29/21 1444       PT SHORT TERM GOAL #1   Title Pt will be ind with initial HEP    Time 4    Period Weeks    Status On-going    Target Date 06/22/21               PT Long Term Goals - 05/25/21 1208       PT LONG TERM GOAL #1   Title Pt will be ind with  advanced HEP and understand how to protect neck from future flare ups.    Time 8    Period Weeks    Status New    Target Date 07/20/21      PT LONG TERM GOAL #2   Title Pt will improve bil cervical rotation to at least 40 deg with less pain and greater ease for improved driving visibility    Baseline 20-25 deg w/ pain    Time 8    Period Weeks    Status New    Target Date 07/20/21      PT LONG TERM GOAL #3   Title Pt will report improved sleep by at least 70%    Time 8    Period Weeks    Status New    Target Date 07/20/21      PT LONG TERM GOAL #4   Title Improved neck pain with daily activities by at least 50%    Time 8    Period Weeks    Status New    Target Date 07/20/21      PT LONG TERM GOAL #5   Title FOTO score improved to at least 60% to demo improved function.    Baseline 46%    Time 8    Period Weeks    Status New    Target Date 07/20/21                   Plan - 06/07/21 0539     Clinical Impression Statement The patient reports he is doing ex's regularly at home.  Cervical joint hypomobility multiple levels particularly on right. Improved soft tissue mobility particularly in subocciptials noted today compared to last visit.  Decreased upper trap tender point size as well.  Discussed effects of dry needling and purpose.  He has had DN in the past but does not wish to do at this time.  Progression of exercises with discussion of benefits for analgesia and other physiologic benefits of movements.  Verbal cues for head alignment over shoulders and slight tuck to active deep cervical flexors.  Therapist monitoring response throughout treatment session.    Personal Factors and Comorbidities Time since onset of injury/illness/exacerbation;Age  Examination-Participation Restrictions Driving;Yard Work;Community Activity    Rehab Potential Good    PT Frequency 2x / week    PT Duration 8 weeks    PT Treatment/Interventions Electrical Stimulation;Moist  Heat;Traction;Cryotherapy;Dry needling;Manual techniques;ADLs/Self Care Home Management;Therapeutic exercise;Neuromuscular re-education;Functional mobility training;Therapeutic activities;Patient/family education;Passive range of motion;Spinal Manipulations;Joint Manipulations;Taping    PT Next Visit Plan recmeasure cervical ROM;  continue manual techniques,  posture and cervical mobility ex's especially rotation;  cervico/thoracic/scapular strengthening;  wants to hold on doing DN    PT Home Exercise Plan Access Code: [email or web address]             Patient will benefit from skilled therapeutic intervention in order to improve the following deficits and impairments:  Decreased range of motion, Increased muscle spasms, Decreased endurance, Decreased activity tolerance, Pain, Hypomobility, Postural dysfunction, Decreased mobility  Visit Diagnosis: Cervicalgia  Abnormal posture  Cramp and spasm     Problem List Patient Active Problem List   Diagnosis Date Noted   Ear itching 11/30/2018   Snoring 04/29/2016   Atrial tachycardia, paroxysmal (HCC)    Paroxysmal atrial fibrillation (HCC)    Asthma    Hypercholesteremia    PAC (premature atrial contraction)    Acute blood loss anemia 10/28/2012    Class: Acute   DJD (degenerative joint disease) of hip 10/27/2012    Class: Chronic   Ruben Im, PT 06/07/21 9:52 AM Phone: [phone] Fax: [phone]  Alvera Singh 06/07/2021, 9:49 AM  [facility] [address], [place], [place], [address] Phone: [phone]   Fax:  [phone]  Name: David Wright MRN: [id number] Date of Birth: 1946-08-23

## 2021-06-07 NOTE — Patient Instructions (Signed)
Access Code: [email or web address] URL: [email or web address] Date: 06/07/2021 Prepared by: Ruben Im  Exercises Supine Cervical Rotation AROM on Pillow - 3 x daily - 7 x weekly - 1 sets - 10 reps Seated Cervical Rotation AROM - 3 x daily - 7 x weekly - 1 sets - 10 reps Seated Scapular Retraction - 3 x daily - 7 x weekly - 1 sets - 10 reps Standing Backward Shoulder Rolls - 3 x daily - 7 x weekly - 1 sets - 10 reps Supine Chin Tuck - 1 x daily - 7 x weekly - 1 sets - 10 reps - 3s hold Seated Assisted Cervical Rotation with Towel - 3 x daily - 7 x weekly - 1 sets - 3 reps - 10s hold PILATES ROW AND ROTATE - 1 x daily - 7 x weekly - 2 sets - 10 reps Standing Row with Anchored Resistance - 1 x daily - 7 x weekly - 2 sets - 10 reps Standing Shoulder Extension with Resistance - 1 x daily - 7 x weekly - 2 sets - 10 reps Bird Dog - 1 x daily - 7 x weekly - 1 sets - 10 reps Standing Bent Over Single Arm Scapular Row with Table Support - 1 x daily - 7 x weekly - 2 sets - 10 reps

## 2021-06-11 ENCOUNTER — Encounter: Payer: Self-pay | Admitting: Physical Therapy

## 2021-06-11 ENCOUNTER — Ambulatory Visit: Payer: Medicare Other | Admitting: Physical Therapy

## 2021-06-11 ENCOUNTER — Other Ambulatory Visit: Payer: Self-pay

## 2021-06-11 DIAGNOSIS — R293 Abnormal posture: Secondary | ICD-10-CM | POA: Diagnosis not present

## 2021-06-11 DIAGNOSIS — M542 Cervicalgia: Secondary | ICD-10-CM

## 2021-06-11 DIAGNOSIS — R252 Cramp and spasm: Secondary | ICD-10-CM | POA: Diagnosis not present

## 2021-06-11 NOTE — Therapy (Signed)
[facility], [facility], [facility] [address], [place], [place], [address] Phone: [phone]   Fax:  [phone]  Physical Therapy Treatment  Patient Details  Name: David Wright MRN: [id number] Date of Birth: 1946-05-21 Referring Provider (PT): Seward Carol, MD   Encounter Date: 06/11/2021   PT End of Session - 06/11/21 1240     Visit Number 5    Date for PT Re-Evaluation 07/20/21    Authorization Type Medicare Part A and B - KX after 15 visits    Progress Note Due on Visit 10    PT Start Time 0845    PT Stop Time 0930    PT Time Calculation (min) 45 min    Activity Tolerance Patient tolerated treatment well    Behavior During Therapy [facility] for tasks assessed/performed             Past Medical History:  Diagnosis Date   Anginal pain ([place])    has been worked-up but all neg.   Arthritis    Asthma    well controlled   Atrial tachycardia, paroxysmal (HCC)    Complication of anesthesia 40 yrs.ago   spinal headache   Dysrhythmia    A-Fibrillation   Headache    Hypercholesteremia    pt reports that his cholesterol is normal   PAC (premature atrial contraction)    Paroxysmal atrial fibrillation (HCC)    Snores    Spinal headache    40 years ago    Past Surgical History:  Procedure Laterality Date   COLONOSCOPY WITH PROPOFOL N/A 09/23/2016   Procedure: COLONOSCOPY WITH PROPOFOL;  Surgeon: Garlan Fair, MD;  Location: [facility];  Service: Endoscopy;  Laterality: N/A;   EYE SURGERY     age 42   INGUINAL HERNIA REPAIR Right 12/04/2016   Procedure: LAPAROSCOPIC RIGHT INGUINAL HERNIA WITH MESH;  Surgeon: Coralie Keens, MD;  Location: [place];  Service: General;  Laterality: Right;   INSERTION OF MESH Right 12/04/2016   Procedure: INSERTION OF MESH;  Surgeon: Coralie Keens, MD;  Location: [place];  Service: General;  Laterality: Right;   JOINT REPLACEMENT     perianal fistula     had spinal anesthesia and had spinal  headache after surgery, 40 years ago   [place]  10/27/2012   RIGHT HIP    There were no vitals filed for this visit.   Subjective Assessment - 06/11/21 1145     Subjective Pt reports 5/10 pain on arrival.  Good compliance with HEP, brought home [employer] cervical traction unit to go over how to use and what level of traction to shoot for.  PT is helping but maybe not as fast as hoped for.    Diagnostic tests xrays 2022: severe degen C5/6, C6/7    Patient Stated Goals increase ROM, reduce headaches (daily), reduce pain, sleep improved    Currently in Pain? Yes    Pain Score 5     Pain Location Neck    Pain Orientation Right;Left    Pain Descriptors / Indicators Aching;Tightness    Pain Type Chronic pain    Pain Radiating Towards Rt upper trap with Lt Rot effort    Pain Onset More than a month ago    Pain Frequency Constant    Aggravating Factors  turning head, laying on side for sleep    Pain Relieving Factors massage    Effect of Pain on Daily Activities drive, sleep  [facility] PT Assessment - 06/11/21 0001       AROM   Cervical - Right Rotation 35   improved to 50 deg end of session   Cervical - Left Rotation 35   improved to 58 deg end of session                          [place] Adult PT Treatment/Exercise - 06/11/21 0001       Self-Care   Self-Care Other Self-Care Comments    Other Self-Care Comments  demo with Pt supine home cervical traction unit at lowest and mid flexion settings, to 15lb, recommend 10-15' daily      Neuro Re-ed    Neuro Re-ed Details  supine manual resistance concentric and eccentric control in neck retraction for rotation each way x 5 reps, PT providing manual resistance in both phases      Neck Exercises: Standing   Other Standing Exercises neck retraction into ball on wall 10x5" holds    Other Standing Exercises green band row bil, then single arm with rotation Rt/Lt each, repeat x  10 cycles      Shoulder Exercises: Standing   Horizontal ABduction Strengthening;Theraband;15 reps;Both    Theraband Level (Shoulder Horizontal ABduction) Level 3 (Green)      Manual Therapy   Manual Therapy Soft tissue mobilization;Joint mobilization    Manual therapy comments suboccipital release; Addaday bil upper traps seated    Joint Mobilization supine upper thoracic mobs for rotation, sideglides and upglides/downglides lower and mid cervical Gr 2-3 for improved rotation    Soft tissue mobilization STM and tPR release Rt side scalenes, suboccipitals, upper trap, levator scap, cervical paraspinals                      PT Short Term Goals - 06/11/21 1249       PT SHORT TERM GOAL #1   Title Pt will be ind with initial HEP    Status Achieved      PT SHORT TERM GOAL #2   Title Pt will achieve bil rotation, flexion and ext to at least 35 deg.    Baseline met for rotation bil    Status On-going               PT Long Term Goals - 05/25/21 1208       PT LONG TERM GOAL #1   Title Pt will be ind with advanced HEP and understand how to protect neck from future flare ups.    Time 8    Period Weeks    Status New    Target Date 07/20/21      PT LONG TERM GOAL #2   Title Pt will improve bil cervical rotation to at least 40 deg with less pain and greater ease for improved driving visibility    Baseline 20-25 deg w/ pain    Time 8    Period Weeks    Status New    Target Date 07/20/21      PT LONG TERM GOAL #3   Title Pt will report improved sleep by at least 70%    Time 8    Period Weeks    Status New    Target Date 07/20/21      PT LONG TERM GOAL #4   Title Improved neck pain with daily activities by at least 50%    Time 8    Period Weeks  Status New    Target Date 07/20/21      PT LONG TERM GOAL #5   Title FOTO score improved to at least 60% to demo improved function.    Baseline 46%    Time 8    Period Weeks    Status New    Target Date  07/20/21                   Plan - 06/11/21 1240     Clinical Impression Statement Pt reports some improvement in pain and ROM but progress feels slow.  He would like to try DN next visit per his conversation with last treating PT.  He does present with TPs in deep cervical multifidi especially in lower and mid cervical region as well as bil upper traps.  Manual therapy techniques used for improved segmental mobility which did yield improved bil Rot with greater ease end of session (35 deg beginning of session bil, improved to 50 deg Rt and 58 deg Lt end of session.)  He brought his home traction unit in today to review how to use properly.  Internet and EPIC documenting system down during session so unable to progress HEP today.  Therapist monitoring response throughout treatment session.    Rehab Potential Good    PT Frequency 2x / week    PT Duration 8 weeks    PT Treatment/Interventions Electrical Stimulation;Moist Heat;Traction;Cryotherapy;Dry needling;Manual techniques;ADLs/Self Care Home Management;Therapeutic exercise;Neuromuscular re-education;Functional mobility training;Therapeutic activities;Patient/family education;Passive range of motion;Spinal Manipulations;Joint Manipulations;Taping    PT Next Visit Plan meausre neck ROM for STG, Pt would like to try DN next time, continue manual techniques for improved ROM esp rotation, continue postural stabilization and strengthening    PT Home Exercise Plan Access Code: [email or web address]    Consulted and Agree with Plan of Care Patient             Patient will benefit from skilled therapeutic intervention in order to improve the following deficits and impairments:     Visit Diagnosis: Cervicalgia  Abnormal posture  Cramp and spasm     Problem List Patient Active Problem List   Diagnosis Date Noted   Ear itching 11/30/2018   Snoring 04/29/2016   Atrial tachycardia, paroxysmal (HCC)    Paroxysmal atrial fibrillation (HCC)     Asthma    Hypercholesteremia    PAC (premature atrial contraction)    Acute blood loss anemia 10/28/2012    Class: Acute   DJD (degenerative joint disease) of hip 10/27/2012    Class: Chronic    Johanna Beuhring, PT 06/11/21 12:50 PM    [facility] [address], [place], [place], [address] Phone: [phone]   Fax:  [phone]  Name: Sukhdeep Wieting MRN: [id number] Date of Birth: 03/27/1946

## 2021-06-14 ENCOUNTER — Other Ambulatory Visit: Payer: Self-pay

## 2021-06-14 ENCOUNTER — Ambulatory Visit: Payer: Medicare Other | Admitting: Physical Therapy

## 2021-06-14 DIAGNOSIS — M542 Cervicalgia: Secondary | ICD-10-CM | POA: Diagnosis not present

## 2021-06-14 DIAGNOSIS — R293 Abnormal posture: Secondary | ICD-10-CM | POA: Diagnosis not present

## 2021-06-14 DIAGNOSIS — R252 Cramp and spasm: Secondary | ICD-10-CM

## 2021-06-14 NOTE — Therapy (Signed)
[facility] [address] [place], [place], [address] Phone: [phone]   Fax:  [phone]  Physical Therapy Treatment  Patient Details  Name: David Wright MRN: [id number] Date of Birth: Mar 16, 1946 Referring Provider (PT): Seward Carol, MD   Encounter Date: 06/14/2021   PT End of Session - 06/14/21 2138     Visit Number 6    Date for PT Re-Evaluation 07/20/21    Authorization Type Medicare Part A and B - KX after 15 visits    Progress Note Due on Visit 10    PT Start Time 0845    PT Stop Time 0930    PT Time Calculation (min) 45 min    Activity Tolerance Patient tolerated treatment well             Past Medical History:  Diagnosis Date   Anginal pain ([place])    has been worked-up but all neg.   Arthritis    Asthma    well controlled   Atrial tachycardia, paroxysmal (HCC)    Complication of anesthesia 40 yrs.ago   spinal headache   Dysrhythmia    A-Fibrillation   Headache    Hypercholesteremia    pt reports that his cholesterol is normal   PAC (premature atrial contraction)    Paroxysmal atrial fibrillation (HCC)    Snores    Spinal headache    40 years ago    Past Surgical History:  Procedure Laterality Date   COLONOSCOPY WITH PROPOFOL N/A 77/30/2017   Procedure: COLONOSCOPY WITH PROPOFOL;  Surgeon: Garlan Fair, MD;  [facility];  Service: Endoscopy;  Laterality: N/A;   EYE SURGERY     age 75   INGUINAL HERNIA REPAIR Right 12/04/2016   Procedure: LAPAROSCOPIC RIGHT INGUINAL HERNIA WITH MESH;  Surgeon: Coralie Keens, MD;  Location: [place];  Service: General;  Laterality: Right;   INSERTION OF MESH Right 12/04/2016   Procedure: INSERTION OF MESH;  Surgeon: Coralie Keens, MD;  Location: [place];  Service: General;  Laterality: Right;   JOINT REPLACEMENT     perianal fistula     had spinal anesthesia and had spinal headache after surgery, 40 years ago   [place]  10/27/2012   RIGHT HIP    There were no vitals filed for this visit.   Subjective Assessment - 06/14/21 0845     Subjective Not too bad.  I'd do one more week of this before I do the DN.  I'm using the traction 1x/day for 10-15 minutes.  I can't sleep on sides.    Diagnostic tests xrays 2022: severe degen C5/6, C6/7    Patient Stated Goals increase ROM, reduce headaches (daily), reduce pain, sleep improved    Currently in Pain? Yes    Pain Score 4     Pain Location Neck    Pain Orientation Mid    Pain Type Chronic pain    Aggravating Factors  turning left for right;  I have to sleep on my side                [facility] PT Assessment - 06/14/21 0001       AROM   Cervical - Right Side Bend 25    Cervical - Left Side Bend 25                           [facility] Adult PT  Treatment/Exercise - 06/14/21 0001       Neck Exercises: Theraband   Other Theraband Exercises given blue band at his request for higher intensity      Neck Exercises: Supine   Other Supine Exercise blue foam roll Melt method with emphasis on cervical rotation and deep flexor nods      Neck Exercises: Sidelying   Other Sidelying Exercise open books right/left 10x with neck rotation following arm movement right/left 10x      Neck Exercises: Prone   Other Prone Exercise quadruped hand behind head with cervical and thoracic rotation 10x right/left      Manual Therapy   Manual therapy comments suboccipital release; Addaday bil upper traps seated; rotation mob with movement 8x right/left    Joint Mobilization PAs Grade 1-3 C2-7 for decreased pain and improved ROM; lateral glides and rotation mobs C4-7    Soft tissue mobilization STM and tPR release Rt side scalenes, suboccipitals, upper trap, levator scap, cervical paraspinals                      PT Short Term Goals - 06/11/21 1249       PT SHORT TERM GOAL #1   Title Pt will be ind with initial HEP     Status Achieved      PT SHORT TERM GOAL #2   Title Pt will achieve bil rotation, flexion and ext to at least 35 deg.    Baseline met for rotation bil    Status On-going               PT Long Term Goals - 05/25/21 1208       PT LONG TERM GOAL #1   Title Pt will be ind with advanced HEP and understand how to protect neck from future flare ups.    Time 8    Period Weeks    Status New    Target Date 07/20/21      PT LONG TERM GOAL #2   Title Pt will improve bil cervical rotation to at least 40 deg with less pain and greater ease for improved driving visibility    Baseline 20-25 deg w/ pain    Time 8    Period Weeks    Status New    Target Date 07/20/21      PT LONG TERM GOAL #3   Title Pt will report improved sleep by at least 70%    Time 8    Period Weeks    Status New    Target Date 07/20/21      PT LONG TERM GOAL #4   Title Improved neck pain with daily activities by at least 50%    Time 8    Period Weeks    Status New    Target Date 07/20/21      PT LONG TERM GOAL #5   Title FOTO score improved to at least 60% to demo improved function.    Baseline 46%    Time 8    Period Weeks    Status New    Target Date 07/20/21                   Plan - 06/14/21 2138     Clinical Impression Statement The patient continues to have limited cervical rotation but improved with comprehensive manual therapy combined with therapeutic ex.  Added thoracic extension movements also improved cervical rotation mobility.  Good response to mobs with movement.  He is considering DN potentially next visit.  Therapist monitoring response and providing cues to prevent substitution patterns.    Personal Factors and Comorbidities Time since onset of injury/illness/exacerbation;Age    Rehab Potential Good    PT Duration 8 weeks    PT Treatment/Interventions Electrical Stimulation;Moist Heat;Traction;Cryotherapy;Dry needling;Manual techniques;ADLs/Self Care Home  Management;Therapeutic exercise;Neuromuscular re-education;Functional mobility training;Therapeutic activities;Patient/family education;Passive range of motion;Spinal Manipulations;Joint Manipulations;Taping    PT Next Visit Plan Pt would like to try DN next time, continue manual techniques for improved ROM esp rotation, continue postural stabilization and strengthening; using blue band now    PT Home Exercise Plan Access Code: [email or web address]             Patient will benefit from skilled therapeutic intervention in order to improve the following deficits and impairments:  Decreased range of motion, Increased muscle spasms, Decreased endurance, Decreased activity tolerance, Pain, Hypomobility, Postural dysfunction, Decreased mobility  Visit Diagnosis: Cervicalgia  Abnormal posture  Cramp and spasm     Problem List Patient Active Problem List   Diagnosis Date Noted   Ear itching 11/30/2018   Snoring 04/29/2016   Atrial tachycardia, paroxysmal (HCC)    Paroxysmal atrial fibrillation (HCC)    Asthma    Hypercholesteremia    PAC (premature atrial contraction)    Acute blood loss anemia 10/28/2012    Class: Acute   DJD (degenerative joint disease) of hip 10/27/2012    Class: Chronic   Ruben Im, PT 06/14/21 9:43 PM Phone: [phone] Fax: [phone]  Alvera Singh 06/14/2021, 9:43 PM  [facility] [address], [place], [place], [address] Phone: [phone]   Fax:  [phone]  Name: David Wright MRN: [id number] Date of Birth: 12-Dec-1945

## 2021-06-18 ENCOUNTER — Encounter: Payer: Medicare Other | Admitting: Physical Therapy

## 2021-06-19 ENCOUNTER — Ambulatory Visit: Payer: Medicare Other | Admitting: Physical Therapy

## 2021-06-19 ENCOUNTER — Other Ambulatory Visit: Payer: Self-pay

## 2021-06-19 DIAGNOSIS — M542 Cervicalgia: Secondary | ICD-10-CM | POA: Diagnosis not present

## 2021-06-19 DIAGNOSIS — R252 Cramp and spasm: Secondary | ICD-10-CM | POA: Diagnosis not present

## 2021-06-19 DIAGNOSIS — R293 Abnormal posture: Secondary | ICD-10-CM | POA: Diagnosis not present

## 2021-06-19 NOTE — Therapy (Signed)
[facility] [address], [place], [place], [address] Phone: [phone]   Fax:  [phone]  Physical Therapy Treatment  Patient Details  Name: David Wright MRN: [id number] Date of Birth: 1946/11/19 Referring Provider (PT): Seward Carol, MD   Encounter Date: 06/19/2021   PT End of Session - 06/19/21 0953     Visit Number 7    Date for PT Re-Evaluation 07/20/21    Authorization Type Medicare Part A and B - KX after 15 visits    Progress Note Due on Visit 10    PT Start Time 0930    PT Stop Time 1003   DN, heat   PT Time Calculation (min) 33 min    Activity Tolerance Patient tolerated treatment well             Past Medical History:  Diagnosis Date   Anginal pain ([place])    has been worked-up but all neg.   Arthritis    Asthma    well controlled   Atrial tachycardia, paroxysmal (HCC)    Complication of anesthesia 40 yrs.ago   spinal headache   Dysrhythmia    A-Fibrillation   Headache    Hypercholesteremia    pt reports that his cholesterol is normal   PAC (premature atrial contraction)    Paroxysmal atrial fibrillation (HCC)    Snores    Spinal headache    40 years ago    Past Surgical History:  Procedure Laterality Date   COLONOSCOPY WITH PROPOFOL N/A 09/23/2016   Procedure: COLONOSCOPY WITH PROPOFOL;  Surgeon: Garlan Fair, MD;  [facility];  Service: Endoscopy;  Laterality: N/A;   EYE SURGERY     age 13   INGUINAL HERNIA REPAIR Right 12/04/2016   Procedure: LAPAROSCOPIC RIGHT INGUINAL HERNIA WITH MESH;  Surgeon: Coralie Keens, MD;  Location: [place];  Service: General;  Laterality: Right;   INSERTION OF MESH Right 12/04/2016   Procedure: INSERTION OF MESH;  Surgeon: Coralie Keens, MD;  Location: [place];  Service: General;  Laterality: Right;   JOINT REPLACEMENT     perianal fistula     had spinal anesthesia and had spinal headache after surgery, 40 years ago   [place]  10/27/2012   RIGHT HIP    There were no vitals filed for this visit.   Subjective Assessment - 06/19/21 0932     Subjective I'm really stiff today.  Traction 1x/day and bands.    Patient Stated Goals increase ROM, reduce headaches (daily), reduce pain, sleep improved    Currently in Pain? Yes    Pain Score 2     Pain Location Neck    Pain Orientation Right;Left    Pain Type Chronic pain    Aggravating Factors  turning quickly                               [facility] Adult PT Treatment/Exercise - 06/19/21 0001       Self-Care   Other Self-Care Comments  DN after care      Neck Exercises: Seated   Other Seated Exercise review of HEP and discussion of frequent ROM to compliment treatment; encouraged rotation and sidebending      Moist Heat Therapy   Number Minutes Moist Heat 3 Minutes    Moist Heat Location Cervical      Manual Therapy   Soft  tissue mobilization bil cervical paraspinals, upper traps and suboccipitals              Trigger Point Dry Needling - 06/19/21 0001     Consent Given? Yes    Education Handout Provided Yes    Muscles Treated Head and Neck Upper trapezius;Cervical multifidi;Suboccipitals    Other Dry Needling bil    Upper Trapezius Response Twitch reponse elicited;Palpable increased muscle length    Suboccipitals Response Palpable increased muscle length    Cervical multifidi Response Palpable increased muscle length                  PT Education - 06/19/21 1006     Education Details dry needling after care    Person(s) Educated Patient    Methods Explanation;Handout    Comprehension Verbalized understanding              PT Short Term Goals - 06/11/21 1249       PT SHORT TERM GOAL #1   Title Pt will be ind with initial HEP    Status Achieved      PT SHORT TERM GOAL #2   Title Pt will achieve bil rotation, flexion and ext to at least 35 deg.    Baseline met for rotation bil     Status On-going               PT Long Term Goals - 05/25/21 1208       PT LONG TERM GOAL #1   Title Pt will be ind with advanced HEP and understand how to protect neck from future flare ups.    Time 8    Period Weeks    Status New    Target Date 07/20/21      PT LONG TERM GOAL #2   Title Pt will improve bil cervical rotation to at least 40 deg with less pain and greater ease for improved driving visibility    Baseline 20-25 deg w/ pain    Time 8    Period Weeks    Status New    Target Date 07/20/21      PT LONG TERM GOAL #3   Title Pt will report improved sleep by at least 70%    Time 8    Period Weeks    Status New    Target Date 07/20/21      PT LONG TERM GOAL #4   Title Improved neck pain with daily activities by at least 50%    Time 8    Period Weeks    Status New    Target Date 07/20/21      PT LONG TERM GOAL #5   Title FOTO score improved to at least 60% to demo improved function.    Baseline 46%    Time 8    Period Weeks    Status New    Target Date 07/20/21                   Plan - 06/19/21 1007     Clinical Impression Statement The patient has limited cervical rotation and sidebending ROM on arrival and persisting at home despite regular compliance with HEP.  He is interested in proceeding with DN in conjunction to manual therapy.  Twitch responses produced which is a good prognostic indicator of benefit.  Following treatment session, minor improvements in mobility noted.  Encouraged frequent cervical ROM throughout the day and continuation of HEP for greater benefit.  Therapist  monitoring response throughout treatment session.    Personal Factors and Comorbidities Time since onset of injury/illness/exacerbation;Age    Examination-Activity Limitations Sleep;Carry;Lift    Examination-Participation Restrictions Driving;Yard Work;Community Activity    Stability/Clinical Decision Making Stable/Uncomplicated    Rehab Potential Good    PT  Frequency 2x / week    PT Duration 8 weeks    PT Treatment/Interventions Electrical Stimulation;Moist Heat;Traction;Cryotherapy;Dry needling;Manual techniques;ADLs/Self Care Home Management;Therapeutic exercise;Neuromuscular re-education;Functional mobility training;Therapeutic activities;Patient/family education;Passive range of motion;Spinal Manipulations;Joint Manipulations;Taping    PT Next Visit Plan assess response to DN #1, continue manual techniques for improved ROM esp rotation, continue postural stabilization and strengthening; using blue band now    PT Home Exercise Plan Access Code: [email or web address]             Patient will benefit from skilled therapeutic intervention in order to improve the following deficits and impairments:  Decreased range of motion, Increased muscle spasms, Decreased endurance, Decreased activity tolerance, Pain, Hypomobility, Postural dysfunction, Decreased mobility  Visit Diagnosis: Cervicalgia  Abnormal posture  Cramp and spasm     Problem List Patient Active Problem List   Diagnosis Date Noted   Ear itching 11/30/2018   Snoring 04/29/2016   Atrial tachycardia, paroxysmal (HCC)    Paroxysmal atrial fibrillation (HCC)    Asthma    Hypercholesteremia    PAC (premature atrial contraction)    Acute blood loss anemia 10/28/2012    Class: Acute   DJD (degenerative joint disease) of hip 10/27/2012    Class: Chronic   Ruben Im, PT 06/19/21 10:14 AM Phone: [phone] Fax: [phone]  Alvera Singh 06/19/2021, 10:13 AM  [facility] [address], [place], [place], [address] Phone: [phone]   Fax:  [phone]  Name: David Wright MRN: [id number] Date of Birth: 1946-03-26

## 2021-06-19 NOTE — Patient Instructions (Signed)

## 2021-06-21 ENCOUNTER — Other Ambulatory Visit: Payer: Self-pay

## 2021-06-21 ENCOUNTER — Ambulatory Visit: Payer: Medicare Other | Admitting: Physical Therapy

## 2021-06-21 DIAGNOSIS — R252 Cramp and spasm: Secondary | ICD-10-CM

## 2021-06-21 DIAGNOSIS — R293 Abnormal posture: Secondary | ICD-10-CM

## 2021-06-21 DIAGNOSIS — M542 Cervicalgia: Secondary | ICD-10-CM | POA: Diagnosis not present

## 2021-06-21 NOTE — Therapy (Signed)
[facility] [address] [place], [place], [address] Phone: [phone]   Fax:  [phone]  Physical Therapy Treatment  Patient Details  Name: David Wright MRN: [id number] Date of Birth: 10/30/1946 Referring Provider (PT): Seward Carol, MD   Encounter Date: 06/21/2021   PT End of Session - 06/21/21 0942     Visit Number 8    Date for PT Re-Evaluation 07/20/21    Authorization Type Medicare Part A and B - KX after 15 visits    Progress Note Due on Visit 10    PT Start Time 0845    PT Stop Time 0930    PT Time Calculation (min) 45 min    Activity Tolerance Patient tolerated treatment well             Past Medical History:  Diagnosis Date   Anginal pain ([place])    has been worked-up but all neg.   Arthritis    Asthma    well controlled   Atrial tachycardia, paroxysmal (HCC)    Complication of anesthesia 40 yrs.ago   spinal headache   Dysrhythmia    A-Fibrillation   Headache    Hypercholesteremia    pt reports that his cholesterol is normal   PAC (premature atrial contraction)    Paroxysmal atrial fibrillation (HCC)    Snores    Spinal headache    40 years ago    Past Surgical History:  Procedure Laterality Date   COLONOSCOPY WITH PROPOFOL N/A 09/23/2016   Procedure: COLONOSCOPY WITH PROPOFOL;  Surgeon: Garlan Fair, MD;  Location: [facility];  Service: Endoscopy;  Laterality: N/A;   EYE SURGERY     age 29   INGUINAL HERNIA REPAIR Right 12/04/2016   Procedure: LAPAROSCOPIC RIGHT INGUINAL HERNIA WITH MESH;  Surgeon: Coralie Keens, MD;  Location: [place];  Service: General;  Laterality: Right;   INSERTION OF MESH Right 12/04/2016   Procedure: INSERTION OF MESH;  Surgeon: Coralie Keens, MD;  Location: [place];  Service: General;  Laterality: Right;   JOINT REPLACEMENT     perianal fistula     had spinal anesthesia and had spinal headache after surgery, 40 years ago   [place]  10/27/2012   RIGHT HIP    There were no vitals filed for this visit.   Subjective Assessment - 06/21/21 0848     Subjective Not overly sore;  notes mild benefit.  No pain at rest, with rotation and moving quickly only.    Diagnostic tests xrays 2022: severe degen C5/6, C6/7    Patient Stated Goals increase ROM, reduce headaches (daily), reduce pain, sleep improved    Currently in Pain? No/denies    Pain Score 0-No pain   pain with movement   Pain Location Neck    Pain Orientation Right;Left                               [facility] Adult PT Treatment/Exercise - 06/21/21 0001       Neck Exercises: Supine   Other Supine Exercise head on red ball cervical retraction isometics 2x 5    Other Supine Exercise head of red ball with rotations      Neck Exercises: Sidelying   Other Sidelying Exercise open books right/left 10x with neck rotation following arm movement right/left 10x with added green band      Neck Exercises:  Prone   Other Prone Exercise quadruped rotation with head in neutral 10x      Manual Therapy   Manual therapy comments suboccipital release; Addaday bil upper traps seated; rotation mob with movement 8x right/left    Soft tissue mobilization bil cervical paraspinals, upper traps and suboccipitals    Manual Traction cervical traction with suboccipital release                    PT Education - 06/21/21 0932     Education Details open books. quadruped rotation, wall UE slides so forehead touches and UE lift offs    Person(s) Educated Patient    Methods Explanation;Demonstration;Handout    Comprehension Returned demonstration;Verbalized understanding              PT Short Term Goals - 06/11/21 1249       PT SHORT TERM GOAL #1   Title Pt will be ind with initial HEP    Status Achieved      PT SHORT TERM GOAL #2   Title Pt will achieve bil rotation, flexion and ext to at least 35 deg.    Baseline met for  rotation bil    Status On-going               PT Long Term Goals - 05/25/21 1208       PT LONG TERM GOAL #1   Title Pt will be ind with advanced HEP and understand how to protect neck from future flare ups.    Time 8    Period Weeks    Status New    Target Date 07/20/21      PT LONG TERM GOAL #2   Title Pt will improve bil cervical rotation to at least 40 deg with less pain and greater ease for improved driving visibility    Baseline 20-25 deg w/ pain    Time 8    Period Weeks    Status New    Target Date 07/20/21      PT LONG TERM GOAL #3   Title Pt will report improved sleep by at least 70%    Time 8    Period Weeks    Status New    Target Date 07/20/21      PT LONG TERM GOAL #4   Title Improved neck pain with daily activities by at least 50%    Time 8    Period Weeks    Status New    Target Date 07/20/21      PT LONG TERM GOAL #5   Title FOTO score improved to at least 60% to demo improved function.    Baseline 46%    Time 8    Period Weeks    Status New    Target Date 07/20/21                   Plan - 06/21/21 0919     Clinical Impression Statement The patient reports some minor improvement in cervical mobility following DN.  More rotation ROM noted in supine vs. seated.  Tender points noted in left upper trap but improved following manual techniques.  He is open to DN#2 next session and we discussed decreasing frequency to 1x/week after next week.  Recheck ROM measurements next visit to track objective improvement.    Personal Factors and Comorbidities Time since onset of injury/illness/exacerbation;Age    Examination-Activity Limitations Sleep;Carry;Lift    Examination-Participation Restrictions Driving;Yard Work;Community Activity  Stability/Clinical Decision Making Stable/Uncomplicated    Rehab Potential Good    PT Frequency 2x / week    PT Duration 8 weeks    PT Treatment/Interventions Electrical Stimulation;Moist  Heat;Traction;Cryotherapy;Dry needling;Manual techniques;ADLs/Self Care Home Management;Therapeutic exercise;Neuromuscular re-education;Functional mobility training;Therapeutic activities;Patient/family education;Passive range of motion;Spinal Manipulations;Joint Manipulations;Taping    PT Next Visit Plan DN #2, recheck rotation measurement next visit;  continue manual techniques for improved ROM esp rotation, continue postural stabilization and strengthening; using blue band now    PT Home Exercise Plan Access Code: [email or web address]             Patient will benefit from skilled therapeutic intervention in order to improve the following deficits and impairments:  Decreased range of motion, Increased muscle spasms, Decreased endurance, Decreased activity tolerance, Pain, Hypomobility, Postural dysfunction, Decreased mobility  Visit Diagnosis: Cervicalgia  Abnormal posture  Cramp and spasm     Problem List Patient Active Problem List   Diagnosis Date Noted   Ear itching 11/30/2018   Snoring 04/29/2016   Atrial tachycardia, paroxysmal (HCC)    Paroxysmal atrial fibrillation (HCC)    Asthma    Hypercholesteremia    PAC (premature atrial contraction)    Acute blood loss anemia 10/28/2012    Class: Acute   DJD (degenerative joint disease) of hip 10/27/2012    Class: Chronic   Ruben Im, PT 06/21/21 9:57 AM Phone: [phone] Fax: [phone]  Alvera Singh 06/21/2021, 9:57 AM  [facility] [address] [place], [place], [address] Phone: [phone]   Fax:  [phone]  Name: David Wright MRN: [id number] Date of Birth: Nov 06, 1946

## 2021-06-21 NOTE — Patient Instructions (Signed)
Access Code: [email or web address] URL: [email or web address] Date: 06/21/2021 Prepared by: Ruben Im  Exercises Supine Cervical Rotation AROM on Pillow - 3 x daily - 7 x weekly - 1 sets - 10 reps Seated Cervical Rotation AROM - 3 x daily - 7 x weekly - 1 sets - 10 reps Seated Scapular Retraction - 3 x daily - 7 x weekly - 1 sets - 10 reps Standing Backward Shoulder Rolls - 3 x daily - 7 x weekly - 1 sets - 10 reps Supine Chin Tuck - 1 x daily - 7 x weekly - 1 sets - 10 reps - 3s hold Seated Assisted Cervical Rotation with Towel - 3 x daily - 7 x weekly - 1 sets - 3 reps - 10s hold PILATES ROW AND ROTATE - 1 x daily - 7 x weekly - 2 sets - 10 reps Standing Row with Anchored Resistance - 1 x daily - 7 x weekly - 2 sets - 10 reps Standing Shoulder Extension with Resistance - 1 x daily - 7 x weekly - 2 sets - 10 reps Bird Dog - 1 x daily - 7 x weekly - 1 sets - 10 reps Standing Bent Over Single Arm Scapular Row with Table Support - 1 x daily - 7 x weekly - 2 sets - 10 reps Sidelying Open Book Thoracic Rotation with Knee on Foam Roll - 1 x daily - 7 x weekly - 1 sets - 10 reps Quadruped Cervical Rotation - 1 x daily - 7 x weekly - 1 sets - 10 reps Standing Low Trap Setting with Resistance at Wall - 1 x daily - 7 x weekly - 1 sets - 10 reps

## 2021-06-25 ENCOUNTER — Ambulatory Visit: Payer: Medicare Other | Attending: Internal Medicine | Admitting: Physical Therapy

## 2021-06-25 ENCOUNTER — Other Ambulatory Visit: Payer: Self-pay

## 2021-06-25 ENCOUNTER — Encounter: Payer: Self-pay | Admitting: Physical Therapy

## 2021-06-25 DIAGNOSIS — R293 Abnormal posture: Secondary | ICD-10-CM | POA: Insufficient documentation

## 2021-06-25 DIAGNOSIS — R252 Cramp and spasm: Secondary | ICD-10-CM | POA: Diagnosis not present

## 2021-06-25 DIAGNOSIS — M542 Cervicalgia: Secondary | ICD-10-CM | POA: Insufficient documentation

## 2021-06-25 NOTE — Therapy (Signed)
[facility] [address], [place], [place], [address] Phone: [phone]   Fax:  [phone]  Physical Therapy Treatment  Patient Details  Name: David Wright MRN: [id number] Date of Birth: 10-03-1946 Referring Provider (PT): Seward Carol, MD   Encounter Date: 06/25/2021   PT End of Session - 06/25/21 0925     Visit Number 9    Date for PT Re-Evaluation 07/20/21    Authorization Type Medicare Part A and B - KX after 15 visits    Progress Note Due on Visit 10    PT Start Time 0930    PT Stop Time 1025   10 min heat end of session   PT Time Calculation (min) 55 min    Activity Tolerance Patient tolerated treatment well    Behavior During Therapy [facility] for tasks assessed/performed             Past Medical History:  Diagnosis Date   Anginal pain ([place])    has been worked-up but all neg.   Arthritis    Asthma    well controlled   Atrial tachycardia, paroxysmal (HCC)    Complication of anesthesia 40 yrs.ago   spinal headache   Dysrhythmia    A-Fibrillation   Headache    Hypercholesteremia    pt reports that his cholesterol is normal   PAC (premature atrial contraction)    Paroxysmal atrial fibrillation (HCC)    Snores    Spinal headache    40 years ago    Past Surgical History:  Procedure Laterality Date   COLONOSCOPY WITH PROPOFOL N/A 09/23/2016   Procedure: COLONOSCOPY WITH PROPOFOL;  Surgeon: Garlan Fair, MD;  Location: [facility];  Service: Endoscopy;  Laterality: N/A;   EYE SURGERY     age 75   INGUINAL HERNIA REPAIR Right 12/04/2016   Procedure: LAPAROSCOPIC RIGHT INGUINAL HERNIA WITH MESH;  Surgeon: Coralie Keens, MD;  Location: [place];  Service: General;  Laterality: Right;   INSERTION OF MESH Right 12/04/2016   Procedure: INSERTION OF MESH;  Surgeon: Coralie Keens, MD;  Location: [place];  Service: General;  Laterality: Right;   JOINT REPLACEMENT     perianal fistula     had spinal  anesthesia and had spinal headache after surgery, 40 years ago   [place]  10/27/2012   RIGHT HIP    There were no vitals filed for this visit.   Subjective Assessment - 06/25/21 0927     Subjective Pain is about the same.  I don't think the DN is helping.  I don't want to do it again.  I may need to see the orthopedic.  I will try PT for another week or two b/c I see some gains.    Diagnostic tests xrays 2022: severe degen C5/6, C6/7    Patient Stated Goals increase ROM, reduce headaches (daily), reduce pain, sleep improved    Currently in Pain? Yes    Pain Score 5     Pain Location Neck    Pain Orientation Right;Left    Pain Type Chronic pain    Pain Frequency Intermittent                [facility] PT Assessment - 06/25/21 0001       Observation/Other Assessments   Focus on Therapeutic Outcomes (FOTO)  58%, improved by 12% from eval      AROM   Cervical Flexion 50  Cervical Extension 50    Cervical - Right Side Bend 30    Cervical - Left Side Bend 35    Cervical - Right Rotation 35    Cervical - Left Rotation 40      Palpation   Spinal mobility limited thoracic PAs throughout t-spine    Palpation comment TP present with anterior band of tension on Lt                           [facility] Adult PT Treatment/Exercise - 06/25/21 0001       Neck Exercises: Machines for Strengthening   UBE (Upper Arm Bike) L2 1x1 fwd/bwd PT present to discuss symptoms      Neck Exercises: Prone   Other Prone Exercise quadruped rotation with head in neutral 10x      Shoulder Exercises: Supine   Horizontal ABduction Strengthening;Theraband;10 reps    Theraband Level (Shoulder Horizontal ABduction) Level 4 (Blue)    Horizontal ABduction Limitations with neck retraction into towel roll      Shoulder Exercises: Sidelying   Other Sidelying Exercises open books x 10 each side, knee on foam roller      Shoulder Exercises: Standing   Row  Strengthening;Theraband;10 reps    Theraband Level (Shoulder Row) Other (comment)    Row Limitations black, bil, then 10x each Rt/Lt pilates row and rotate    Other Standing Exercises lower trap setting wall slide lift offs, added ball between forehead and wall for greater ease of UE performance x 10      Modalities   Modalities Moist Heat      Moist Heat Therapy   Number Minutes Moist Heat 10 Minutes    Moist Heat Location Cervical   Lt upper trap, supine             Trigger Point Dry Needling - 06/25/21 0001     Consent Given? Yes    Education Handout Provided Previously provided    Muscles Treated Head and Neck Upper trapezius   Lt only   Other Dry Needling Lt, signif release of anterior band of tension    Upper Trapezius Response Twitch reponse elicited;Palpable increased muscle length                    PT Short Term Goals - 06/25/21 0949       PT SHORT TERM GOAL #1   Title Pt will be ind with initial HEP    Status Achieved      PT SHORT TERM GOAL #2   Title Pt will achieve bil rotation, flexion and ext to at least 35 deg.    Status Achieved      PT SHORT TERM GOAL #3   Title Improve FOTO score by at least 7 points to >/= 53%    Baseline 58%    Status Achieved      PT SHORT TERM GOAL #4   Title Pt will report improved sleep and reduced experience of headaches by at least 25%    Status Achieved               PT Long Term Goals - 06/25/21 0949       PT LONG TERM GOAL #1   Title Pt will be ind with advanced HEP and understand how to protect neck from future flare ups.    Status On-going      PT LONG TERM GOAL #2  Title Pt will improve bil cervical rotation to at least 40 deg with less pain and greater ease for improved driving visibility    Baseline met to Lt 40 deg, still 35 deg to Rt    Status On-going      PT LONG TERM GOAL #3   Title Pt will report improved sleep by at least 70%    Status Achieved      PT LONG TERM GOAL #4    Title Improved neck pain with daily activities by at least 50%    Baseline most days    Status Achieved      PT LONG TERM GOAL #5   Title FOTO score improved to at least 60% to demo improved function.    Baseline 58%    Status On-going                   Plan - 06/25/21 6384     Clinical Impression Statement Pt arrived unsure of making progress with PT.  However, upon measurements and goal review, Pt had met goal of 70% improvement in sleep, >25% reduction in headache frequency, 50% reduction in pain with daily activities, and demo'd signif improvement in ROM as compared to initial visit.  He now has 50 deg for both flexion and extension and has made gains in Rot and SB bil.  He may be reaching plateau with Rt Rot which measures 35 deg.  FOTO score has improved by 12% nearly reaching goal (58% today, goal 60%).  Pt initially declined DN but then upon palpation by PT he agreed to Lt upper trap DN which had signif twitch and release.  Heat used end of session for soreness.  PT reviewed HEP with band progression to black for row and row with rotation, and altered wall slide lower trap setting to using a ball between forehead and wall for more excursion opportunity for wall lift off of UEs.  PT discouarged Pt from overhead tricep press (he was doing at home on his own) and verbally instructed option for standing band tricep kickback.  Pt was encouraged by progress after objective measures and goal review and plans to attend several more sessions of PT to see if any further gains are made.    Rehab Potential Good    PT Duration 8 weeks    PT Treatment/Interventions Electrical Stimulation;Moist Heat;Traction;Cryotherapy;Dry needling;Manual techniques;ADLs/Self Care Home Management;Therapeutic exercise;Neuromuscular re-education;Functional mobility training;Therapeutic activities;Patient/family education;Passive range of motion;Spinal Manipulations;Joint Manipulations;Taping    PT Next Visit Plan  10th visit PN next visit, FOTO and goal review was performed at 9th visit, recheck neck measurements, f/u on DN to Lt upper trap, continue neck stab/strength, thoracic mobility, scapular stab    PT Home Exercise Plan Access Code: [email or web address]    Consulted and Agree with Plan of Care Patient             Patient will benefit from skilled therapeutic intervention in order to improve the following deficits and impairments:     Visit Diagnosis: Cervicalgia  Abnormal posture  Cramp and spasm     Problem List Patient Active Problem List   Diagnosis Date Noted   Ear itching 11/30/2018   Snoring 04/29/2016   Atrial tachycardia, paroxysmal (HCC)    Paroxysmal atrial fibrillation (HCC)    Asthma    Hypercholesteremia    PAC (premature atrial contraction)    Acute blood loss anemia 10/28/2012    Class: Acute   DJD (degenerative joint disease) of hip 10/27/2012  Class: Chronic    Baruch Merl, PT 06/25/21 11:29 AM    [facility] [address], [place], [place], [address] Phone: [phone]   Fax:  [phone]  Name: Adlai Sinning MRN: [id number] Date of Birth: 12-Mar-1946

## 2021-06-28 ENCOUNTER — Other Ambulatory Visit: Payer: Self-pay

## 2021-06-28 ENCOUNTER — Ambulatory Visit: Payer: Medicare Other | Admitting: Physical Therapy

## 2021-06-28 DIAGNOSIS — R293 Abnormal posture: Secondary | ICD-10-CM

## 2021-06-28 DIAGNOSIS — M542 Cervicalgia: Secondary | ICD-10-CM

## 2021-06-28 DIAGNOSIS — R252 Cramp and spasm: Secondary | ICD-10-CM | POA: Diagnosis not present

## 2021-06-28 NOTE — Therapy (Signed)
[facility] [address] [place], [place], [address] Phone: [phone]   Fax:  [phone]  Physical Therapy Treatment/Discharge Summary   Patient Details  Name: David Wright MRN: [id number] Date of Birth: Sep 16, 1946 Referring Provider (PT): Seward Carol, MD  Progress Note Reporting Period 05/25/21 to 06/28/2021   See note below for Objective Data and Assessment of Progress/Goals.     Encounter Date: 06/28/2021   PT End of Session - 06/28/21 0836     Visit Number 10    Date for PT Re-Evaluation 07/20/21    Authorization Type Medicare Part A and B - KX after 15 visits    Progress Note Due on Visit 10    PT Start Time 0758    PT Stop Time 0830    PT Time Calculation (min) 32 min    Activity Tolerance Patient tolerated treatment well             Past Medical History:  Diagnosis Date   Anginal pain ([place])    has been worked-up but all neg.   Arthritis    Asthma    well controlled   Atrial tachycardia, paroxysmal (HCC)    Complication of anesthesia 40 yrs.ago   spinal headache   Dysrhythmia    A-Fibrillation   Headache    Hypercholesteremia    pt reports that his cholesterol is normal   PAC (premature atrial contraction)    Paroxysmal atrial fibrillation (HCC)    Snores    Spinal headache    40 years ago    Past Surgical History:  Procedure Laterality Date   COLONOSCOPY WITH PROPOFOL N/A 09/23/2016   Procedure: COLONOSCOPY WITH PROPOFOL;  Surgeon: Garlan Fair, MD;  Location: [facility];  Service: Endoscopy;  Laterality: N/A;   EYE SURGERY     age 89   INGUINAL HERNIA REPAIR Right 12/04/2016   Procedure: LAPAROSCOPIC RIGHT INGUINAL HERNIA WITH MESH;  Surgeon: Coralie Keens, MD;  Location: [facility];  Service: General;  Laterality: Right;   INSERTION OF MESH Right 12/04/2016   Procedure: INSERTION OF MESH;  Surgeon: Coralie Keens, MD;  Location: [facility];  Service: General;  Laterality: Right;    JOINT REPLACEMENT     perianal fistula     had spinal anesthesia and had spinal headache after surgery, 40 years ago   [place]  10/27/2012   RIGHT HIP    There were no vitals filed for this visit.   Subjective Assessment - 06/28/21 0758     Subjective Pretty good.  I think I've reached my peak.  Ready for discharged.    Currently in Pain? No/denies   only if I turn fast   Pain Score 0-No pain    Pain Location Neck                [facility] PT Assessment - 06/28/21 0001       Observation/Other Assessments   Focus on Therapeutic Outcomes (FOTO)  58%, improved by 12% from eval      AROM   Cervical Flexion 50    Cervical Extension 50    Cervical - Right Side Bend 30    Cervical - Left Side Bend 35    Cervical - Right Rotation 35    Cervical - Left Rotation 40      Strength   Overall Strength Comments grossly 5/5  [place] Adult PT Treatment/Exercise - 06/28/21 0001       Self-Care   Other Self-Care Comments  benefit of massage therapy      Neck Exercises: Machines for Strengthening   UBE (Upper Arm Bike) L2 1x1 fwd/bwd PT present to discuss symptoms      Neck Exercises: Seated   Other Seated Exercise review of HEP and discussion of frequent ROM to compliment treatment; encouraged rotation and sidebending      Neck Exercises: Supine   Other Supine Exercise suboccipital release with tennis balls      Manual Therapy   Manual therapy comments suboccipital release; [place] bil upper traps seated; rotation mob with movement 8x right/left    Joint Mobilization PAs Grade 1-3 C2-7 for decreased pain and improved ROM; lateral glides and rotation mobs C4-7    Soft tissue mobilization STM and tPR release Rt side scalenes, suboccipitals, upper trap, levator scap, cervical paraspinals                      PT Short Term Goals - 06/28/21 0842       PT SHORT TERM GOAL #1   Title Pt will be  ind with initial HEP    Status Achieved      PT SHORT TERM GOAL #2   Title Pt will achieve bil rotation, flexion and ext to at least 35 deg.    Status Achieved      PT SHORT TERM GOAL #3   Title Improve FOTO score by at least 7 points to >/= 53%    Status Achieved      PT SHORT TERM GOAL #4   Title Pt will report improved sleep and reduced experience of headaches by at least 25%    Status Achieved               PT Long Term Goals - 06/28/21 0842       PT LONG TERM GOAL #1   Title Pt will be ind with advanced HEP and understand how to protect neck from future flare ups.    Status Achieved      PT LONG TERM GOAL #2   Title Pt will improve bil cervical rotation to at least 40 deg with less pain and greater ease for improved driving visibility    Status Partially Met      PT LONG TERM GOAL #3   Title Pt will report improved sleep by at least 70%    Status Achieved      PT LONG TERM GOAL #4   Title Improved neck pain with daily activities by at least 50%    Status Achieved      PT LONG TERM GOAL #5   Title FOTO score improved to at least 60% to demo improved function.    Status Partially Met                   Plan - 06/28/21 0836     Clinical Impression Statement The patient has met the majority of goals and he expresses readiness for discharge to independent HEP.  Good improvements in cervical ROM in all planes but still limited with rotation.  FOTO score has improved by 12%.  Discussed self care strategies including traction as needed, tennis ball suboccipital release, myofascial mechanical gun and massage therapy benefits.    Personal Factors and Comorbidities Time since onset of injury/illness/exacerbation;Age    Examination-Activity Limitations Sleep;Carry;Lift    Examination-Participation Restrictions Driving;[employer]  Work;Community Activity    Rehab Potential Good    PT Frequency 2x / week    PT Duration 8 weeks    PT Treatment/Interventions Electrical  Stimulation;Moist Heat;Traction;Cryotherapy;Dry needling;Manual techniques;ADLs/Self Care Home Management;Therapeutic exercise;Neuromuscular re-education;Functional mobility training;Therapeutic activities;Patient/family education;Passive range of motion;Spinal Manipulations;Joint Manipulations;Taping    PT Next Visit Plan Discharge             Patient will benefit from skilled therapeutic intervention in order to improve the following deficits and impairments:  Decreased range of motion, Increased muscle spasms, Decreased endurance, Decreased activity tolerance, Pain, Hypomobility, Postural dysfunction, Decreased mobility  Visit Diagnosis: Cervicalgia  Abnormal posture  Cramp and spasm   PHYSICAL THERAPY DISCHARGE SUMMARY  Visits from Start of Care: 10  Current functional level related to goals / functional outcomes: See clinical impressions above   Remaining deficits: AS above   Education / Equipment: HEP   Patient agrees to discharge. Patient goals were met. Patient is being discharged due to meeting the stated rehab goals.   Problem List Patient Active Problem List   Diagnosis Date Noted   Ear itching 11/30/2018   Snoring 04/29/2016   Atrial tachycardia, paroxysmal (HCC)    Paroxysmal atrial fibrillation (HCC)    Asthma    Hypercholesteremia    PAC (premature atrial contraction)    Acute blood loss anemia 10/28/2012    Class: Acute   DJD (degenerative joint disease) of hip 10/27/2012    Class: Chronic   Ruben Im, PT 06/28/21 8:44 AM Phone: [phone] Fax: [phone]  Alvera Singh 06/28/2021, 8:43 AM  [facility] [address] [place], [place], [address] Phone: [phone]   Fax:  [phone]  Name: David Wright MRN: [id number] Date of Birth: Mar 03, 1946

## 2021-07-02 ENCOUNTER — Encounter: Payer: Medicare Other | Admitting: Physical Therapy

## 2021-07-05 ENCOUNTER — Encounter: Payer: Medicare Other | Admitting: Physical Therapy

## 2021-07-09 ENCOUNTER — Encounter: Payer: Medicare Other | Admitting: Physical Therapy

## 2021-07-13 ENCOUNTER — Encounter: Payer: Medicare Other | Admitting: Physical Therapy

## 2021-07-16 ENCOUNTER — Encounter: Payer: Medicare Other | Admitting: Physical Therapy

## 2021-07-19 ENCOUNTER — Encounter: Payer: Medicare Other | Admitting: Physical Therapy

## 2021-07-24 ENCOUNTER — Other Ambulatory Visit: Payer: Self-pay | Admitting: Cardiology

## 2021-07-24 NOTE — Telephone Encounter (Signed)
Prescription refill request for Eliquis received. Indication:Afib  Last office visit: 09/26/20 Scr: 1.05 Age: 75 Weight: 74.4kg  Appropriate dose and refill sent to requested pharmacy

## 2021-09-18 DIAGNOSIS — H8110 Benign paroxysmal vertigo, unspecified ear: Secondary | ICD-10-CM | POA: Diagnosis not present

## 2021-09-18 DIAGNOSIS — L299 Pruritus, unspecified: Secondary | ICD-10-CM | POA: Diagnosis not present

## 2021-10-05 ENCOUNTER — Other Ambulatory Visit: Payer: Self-pay

## 2021-10-05 ENCOUNTER — Encounter: Payer: Self-pay | Admitting: Cardiology

## 2021-10-05 ENCOUNTER — Ambulatory Visit (INDEPENDENT_AMBULATORY_CARE_PROVIDER_SITE_OTHER): Payer: Medicare Other | Admitting: Cardiology

## 2021-10-05 VITALS — BP 110/70 | HR 59 | Ht 69.0 in | Wt 161.0 lb

## 2021-10-05 DIAGNOSIS — I480 Paroxysmal atrial fibrillation: Secondary | ICD-10-CM | POA: Diagnosis not present

## 2021-10-05 DIAGNOSIS — Z7901 Long term (current) use of anticoagulants: Secondary | ICD-10-CM | POA: Insufficient documentation

## 2021-10-05 DIAGNOSIS — N528 Other male erectile dysfunction: Secondary | ICD-10-CM

## 2021-10-05 DIAGNOSIS — N529 Male erectile dysfunction, unspecified: Secondary | ICD-10-CM | POA: Insufficient documentation

## 2021-10-05 MED ORDER — SILDENAFIL CITRATE 100 MG PO TABS: 100.0000 mg | ORAL_TABLET | Freq: Every day | ORAL | 3 refills | Status: DC | PRN

## 2021-10-05 NOTE — Assessment & Plan Note (Signed)
Since he is quite symptomatic with his atrial fibrillation and his episodes seem to be happening more frequently, I would like for him to sit down with EP to discuss potential ablation.  Discussed the rationale of doing this earlier rather than later.  Also during this consultation, I mention the possibility of watchman.  Both he and his wife have heard about this and are potentially interested.  Continue with Eliquis, flecainide.  An additional flecainide seems to help him break his atrial fibrillation however durations are now lasting about 2 hours.  Symptomatic.

## 2021-10-05 NOTE — Assessment & Plan Note (Signed)
Continue with Eliquis 5 mg twice a day.  Prior hemoglobin reassuring from outside labs.  Creatinine normal as well.

## 2021-10-05 NOTE — Assessment & Plan Note (Signed)
We will give him Viagra 100 mg.  He may break this tablet in half or potentially quarters when first utilizing.  We will give him 6 tablets with refills.  Printed out prescription.  Blood pressure should be able to support this.

## 2021-10-05 NOTE — Patient Instructions (Signed)
Medication Instructions:  The current medical regimen is effective;  continue present plan and medications.  *If you need a refill on your cardiac medications before your next appointment, please call your pharmacy*  Testing/Procedures: Your physician has requested that you have an echocardiogram. Echocardiography is a painless test that uses sound waves to create images of your heart. It provides your doctor with information about the size and shape of your heart and how well your heart's chambers and valves are working. This procedure takes approximately one hour. There are no restrictions for this procedure.  You have been referred to see Dr Lars Mage to discuss possible At Fib ablation.  Follow-Up: At [facility], you and your health needs are our priority.  As part of our continuing mission to provide you with exceptional heart care, we have created designated Provider Care Teams.  These Care Teams include your primary Cardiologist (physician) and Advanced Practice Providers (APPs -  Physician Assistants and Nurse Practitioners) who all work together to provide you with the care you need, when you need it.  We recommend signing up for the patient portal called "MyChart".  Sign up information is provided on this After Visit Summary.  MyChart is used to connect with patients for Virtual Visits (Telemedicine).  Patients are able to view lab/test results, encounter notes, upcoming appointments, etc.  Non-urgent messages can be sent to your provider as well.   To learn more about what you can do with MyChart, go to [email or web address].    Your next appointment:   1 year(s)  The format for your next appointment:   In Person  Provider:   Candee Furbish, MD   Thank you for choosing [facility]!!

## 2021-10-05 NOTE — Progress Notes (Signed)
Cardiology Office Note:    Date:  10/05/2021   ID:  David Wright, DOB [birth year], MRN [id number]  PCP:  Seward Carol, MD   [facility] Providers Cardiologist:  Candee Furbish, MD     Referring MD: Seward Carol, MD    History of Present Illness:    David Wright is a 75 y.o. male here for follow-up of paroxysmal atrial fibrillation.  Previously had a very low PAF burden.  Approximately 3 -5 times over the past year, mostly at night. TV, feels it come on. Symp.  Takes an additional flecainide and breaks.  Over the past year it has been a little bit more frequent.  On Eliquis with minor bruising.  1-2 years ago had a bizzarre episode where he was having trouble with memory, transient. Got better.  Dr. Delfina Redwood is his neighbor.  Has not had any issues since.  He also asked about potentially Viagra.  Okay.  His wife David Wright has been present.  Has had an SVT ablation that may or may not of been successful in the past.  Past Medical History:  Diagnosis Date   Anginal pain ([place])    has been worked-up but all neg.   Arthritis    Asthma    well controlled   Atrial tachycardia, paroxysmal (HCC)    Complication of anesthesia 40 yrs.ago   spinal headache   Dysrhythmia    A-Fibrillation   Headache    Hypercholesteremia    pt reports that his cholesterol is normal   PAC (premature atrial contraction)    Paroxysmal atrial fibrillation (HCC)    Snores    Spinal headache    40 years ago    Past Surgical History:  Procedure Laterality Date   COLONOSCOPY WITH PROPOFOL N/A 09/23/2016   Procedure: COLONOSCOPY WITH PROPOFOL;  Surgeon: Garlan Fair, MD;  [facility];  Service: Endoscopy;  Laterality: N/A;   EYE SURGERY     age 61   INGUINAL HERNIA REPAIR Right 12/04/2016   Procedure: LAPAROSCOPIC RIGHT INGUINAL HERNIA WITH MESH;  Surgeon: Coralie Keens, MD;  Location: [facility];  Service: General;  Laterality: Right;   INSERTION OF MESH Right 12/04/2016   Procedure:  INSERTION OF MESH;  Surgeon: Coralie Keens, MD;  Location: [facility];  Service: General;  Laterality: Right;   JOINT REPLACEMENT     perianal fistula     had spinal anesthesia and had spinal headache after surgery, 40 years ago   [place]  10/27/2012   RIGHT HIP    Current Medications: Current Meds  Medication Sig   alclomethasone (ACLOVATE) 0.05 % cream as needed.   ELIQUIS 5 MG TABS tablet TAKE 1 TABLET TWICE A DAY   flecainide (TAMBOCOR) 50 MG tablet TAKE 1 TABLET TWICE A DAY   fluticasone (FLONASE) 50 MCG/ACT nasal spray Place 2 sprays into the nose daily.   Fluticasone-Salmeterol (ADVAIR) 100-50 MCG/DOSE AEPB Inhale 1 puff into the lungs daily.   levalbuterol (XOPENEX HFA) 45 MCG/ACT inhaler as needed.    metoprolol succinate (TOPROL-XL) 25 MG 24 hr tablet TAKE ONE-HALF (1/2) TABLET AT BEDTIME   sildenafil (VIAGRA) 100 MG tablet Take 1 tablet (100 mg total) by mouth daily as needed for erectile dysfunction.     Allergies:   No known allergies   Social History   Socioeconomic History   Marital status: Married    Spouse name: Not on file   Number of children: Not on file  Years of education: Not on file   Highest education level: Not on file  Occupational History   Not on file  Tobacco Use   Smoking status: Never   Smokeless tobacco: Never  Vaping Use   Vaping Use: Never used  Substance and Sexual Activity   Alcohol use: Yes    Alcohol/week: 0.0 standard drinks    Comment: occasionally a drink per week or less   Drug use: No   Sexual activity: Not on file  Other Topics Concern   Not on file  Social History Narrative   Pt lives in [place] with spouse.   Retired Nature conservation officer for large [place] Strain: Not on [employer] Insecurity: Not on file  Transportation Needs: Not on file  Physical Activity: Not on file  Stress: Not on file  Social Connections: Not  on file     Family History: The patient's family history includes Heart attack in his father and mother; Heart disease in his father and mother; Pancreatic cancer in his sister.  ROS:   Please see the history of present illness.    No fevers chills nausea vomiting syncope bleeding all other systems reviewed and are negative.  EKGs/Labs/Other Studies Reviewed:    The following studies were reviewed today:  ECHO 2017:  - Left ventricle: The cavity size was normal. Wall thickness was    increased in a pattern of mild LVH. The estimated ejection    fraction was 55%. Wall motion was normal; there were no regional    wall motion abnormalities. Doppler parameters are consistent with    abnormal left ventricular relaxation (grade 1 diastolic    dysfunction).  - Aortic valve: There was no stenosis.  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 Aortic    root dimension: 38 mm (ED). Ascending aortic diameter: 38 mm (S).  - Mitral valve: Mildly calcified annulus. There was trivial    regurgitation.  - Right ventricle: The cavity size was normal. Systolic function    was normal.  - Tricuspid valve: Peak RV-RA gradient (S): 25 mm Hg.  - Pulmonary arteries: PA peak pressure: 28 mm Hg (S).  - Inferior vena cava: The vessel was normal in size. The    respirophasic diameter changes were in the normal range (>= 50%),    consistent with normal central venous pressure.   EKG:  EKG is  ordered today.  The ekg ordered today demonstrates sinus bradycardia 59 QRS duration 82 ms  Recent Labs: No results found for requested labs within last 8760 hours.  Recent Lipid Panel    Component Value Date/Time   CHOL (H) 01/24/2011 1714    232        ATP III CLASSIFICATION:  <200     mg/dL   Desirable  200-239  mg/dL   Borderline High  >=240    mg/dL   High          TRIG 172 (H) 01/24/2011 1714   HDL 58 01/24/2011 1714   CHOLHDL 4.0 01/24/2011 1714   VLDL 34 01/24/2011 1714   LDLCALC (H)  01/24/2011 1714    140        Total Cholesterol/HDL:CHD Risk Coronary Heart Disease Risk Table                     Men   Women  1/2 Average Risk   3.4   3.3  Average Risk       5.0   4.4  2 X Average Risk   9.6   7.1  3 X Average Risk  23.4   11.0        Use the calculated Patient Ratio above and the CHD Risk Table to determine the patient's CHD Risk.        ATP III CLASSIFICATION (LDL):  <100     mg/dL   Optimal  100-129  mg/dL   Near or Above                    Optimal  130-159  mg/dL   Borderline  160-189  mg/dL   High  >190     mg/dL   Very High     Risk Assessment/Calculations:    CHA2DS2-VASc Score = 2   This indicates a 2.2% annual risk of stroke. The patient's score is based upon: CHF History: 0 HTN History: 0 Diabetes History: 0 Stroke History: 0 Vascular Disease History: 0 Age Score: 2 Gender Score: 0         Physical Exam:    VS:  BP 110/70 (BP Location: Left Arm, Patient Position: Sitting, Cuff Size: Normal)   Pulse (!) 59   Ht 5\' 9"  (1.753 m)   Wt 161 lb (73 kg)   SpO2 97%   BMI 23.78 kg/m     Wt Readings from Last 3 Encounters:  10/05/21 161 lb (73 kg)  09/26/20 164 lb (74.4 kg)  09/27/19 164 lb 6.4 oz (74.6 kg)     GEN:  Well nourished, well developed in no acute distress HEENT: Normal NECK: No JVD; No carotid bruits LYMPHATICS: No lymphadenopathy CARDIAC: RRR, no murmurs, rubs, gallops RESPIRATORY:  Clear to auscultation without rales, wheezing or rhonchi  ABDOMEN: Soft, non-tender, non-distended MUSCULOSKELETAL:  No edema; No deformity  SKIN: Warm and dry NEUROLOGIC:  Alert and oriented x 3 PSYCHIATRIC:  Normal affect   ASSESSMENT:    1. Paroxysmal atrial fibrillation (HCC)   2. Chronic anticoagulation   3. Other male erectile dysfunction    PLAN:    In order of problems listed above:  Paroxysmal atrial fibrillation ([place]) Since he is quite symptomatic with his atrial fibrillation and his episodes seem to be happening more  frequently, I would like for him to sit down with EP to discuss potential ablation.  Discussed the rationale of doing this earlier rather than later.  Also during this consultation, I mention the possibility of watchman.  Both he and his wife have heard about this and are potentially interested.  Continue with Eliquis, flecainide.  An additional flecainide seems to help him break his atrial fibrillation however durations are now lasting about 2 hours.  Symptomatic.  Chronic anticoagulation Continue with Eliquis 5 mg twice a day.  Prior hemoglobin reassuring from outside labs.  Creatinine normal as well.  Erectile dysfunction We will give him Viagra 100 mg.  He may break this tablet in half or potentially quarters when first utilizing.  We will give him 6 tablets with refills.  Printed out prescription.  Blood pressure should be able to support this.       Medication Adjustments/Labs and Tests Ordered: Current medicines are reviewed at length with the patient today.  Concerns regarding medicines are outlined above.  Orders Placed This Encounter  Procedures   Ambulatory referral to Cardiac Electrophysiology   EKG 12-Lead   ECHOCARDIOGRAM COMPLETE    Meds ordered this encounter  Medications   sildenafil (VIAGRA) 100 MG tablet    Sig: Take 1 tablet (100 mg total) by mouth daily as needed for erectile dysfunction.    Dispense:  6 tablet    Refill:  3     Patient Instructions  Medication Instructions:  The current medical regimen is effective;  continue present plan and medications.  *If you need a refill on your cardiac medications before your next appointment, please call your pharmacy*  Testing/Procedures: Your physician has requested that you have an echocardiogram. Echocardiography is a painless test that uses sound waves to create images of your heart. It provides your doctor with information about the size and shape of your heart and how well your heart's chambers and valves are  working. This procedure takes approximately one hour. There are no restrictions for this procedure.  You have been referred to see Dr Lars Mage to discuss possible At Fib ablation.  Follow-Up: At [facility], you and your health needs are our priority.  As part of our continuing mission to provide you with exceptional heart care, we have created designated Provider Care Teams.  These Care Teams include your primary Cardiologist (physician) and Advanced Practice Providers (APPs -  Physician Assistants and Nurse Practitioners) who all work together to provide you with the care you need, when you need it.  We recommend signing up for the patient portal called "MyChart".  Sign up information is provided on this After Visit Summary.  MyChart is used to connect with patients for Virtual Visits (Telemedicine).  Patients are able to view lab/test results, encounter notes, upcoming appointments, etc.  Non-urgent messages can be sent to your provider as well.   To learn more about what you can do with MyChart, go to [email or web address].    Your next appointment:   1 year(s)  The format for your next appointment:   In Person  Provider:   Candee Furbish, MD   Thank you for choosing [facility]!!     Signed, Candee Furbish, MD  10/05/2021 11:54 AM    [place]

## 2021-10-09 ENCOUNTER — Other Ambulatory Visit: Payer: Self-pay | Admitting: Cardiology

## 2021-10-09 DIAGNOSIS — H2513 Age-related nuclear cataract, bilateral: Secondary | ICD-10-CM | POA: Diagnosis not present

## 2021-10-15 DIAGNOSIS — J3089 Other allergic rhinitis: Secondary | ICD-10-CM | POA: Diagnosis not present

## 2021-10-15 DIAGNOSIS — J4530 Mild persistent asthma, uncomplicated: Secondary | ICD-10-CM | POA: Diagnosis not present

## 2021-10-15 DIAGNOSIS — J301 Allergic rhinitis due to pollen: Secondary | ICD-10-CM | POA: Diagnosis not present

## 2021-10-16 DIAGNOSIS — Z85828 Personal history of other malignant neoplasm of skin: Secondary | ICD-10-CM | POA: Diagnosis not present

## 2021-10-16 DIAGNOSIS — L570 Actinic keratosis: Secondary | ICD-10-CM | POA: Diagnosis not present

## 2021-10-16 DIAGNOSIS — L821 Other seborrheic keratosis: Secondary | ICD-10-CM | POA: Diagnosis not present

## 2021-10-26 ENCOUNTER — Other Ambulatory Visit: Payer: Self-pay

## 2021-10-26 ENCOUNTER — Ambulatory Visit (HOSPITAL_COMMUNITY): Payer: Medicare Other | Attending: Cardiology

## 2021-10-26 DIAGNOSIS — I480 Paroxysmal atrial fibrillation: Secondary | ICD-10-CM | POA: Diagnosis not present

## 2021-10-27 LAB — ECHOCARDIOGRAM COMPLETE: Area-P 1/2: 2.62 cm2

## 2021-11-01 ENCOUNTER — Institutional Professional Consult (permissible substitution): Payer: Medicare Other | Admitting: Cardiology

## 2021-11-01 DIAGNOSIS — H8113 Benign paroxysmal vertigo, bilateral: Secondary | ICD-10-CM | POA: Diagnosis not present

## 2021-11-01 NOTE — Progress Notes (Signed)
Pt has been made aware of normal result and verbalized understanding.  jw

## 2021-12-07 ENCOUNTER — Other Ambulatory Visit: Payer: Self-pay | Admitting: Cardiology

## 2022-01-03 ENCOUNTER — Ambulatory Visit
Admission: RE | Admit: 2022-01-03 | Discharge: 2022-01-03 | Disposition: A | Discharge status: Home / Self Care | Payer: Medicare Other | Source: Ambulatory Visit | Attending: Internal Medicine | Admitting: Internal Medicine

## 2022-01-03 ENCOUNTER — Other Ambulatory Visit: Payer: Self-pay | Admitting: Internal Medicine

## 2022-01-03 DIAGNOSIS — M546 Pain in thoracic spine: Secondary | ICD-10-CM | POA: Diagnosis not present

## 2022-01-03 DIAGNOSIS — R079 Chest pain, unspecified: Secondary | ICD-10-CM | POA: Diagnosis not present

## 2022-01-03 DIAGNOSIS — M2578 Osteophyte, vertebrae: Secondary | ICD-10-CM | POA: Diagnosis not present

## 2022-01-03 DIAGNOSIS — R0789 Other chest pain: Secondary | ICD-10-CM

## 2022-03-05 DIAGNOSIS — M542 Cervicalgia: Secondary | ICD-10-CM | POA: Diagnosis not present

## 2022-03-13 DIAGNOSIS — M5442 Lumbago with sciatica, left side: Secondary | ICD-10-CM | POA: Diagnosis not present

## 2022-03-13 DIAGNOSIS — R079 Chest pain, unspecified: Secondary | ICD-10-CM | POA: Diagnosis not present

## 2022-03-13 DIAGNOSIS — M5441 Lumbago with sciatica, right side: Secondary | ICD-10-CM | POA: Diagnosis not present

## 2022-03-14 ENCOUNTER — Other Ambulatory Visit: Payer: Self-pay | Admitting: Orthopaedic Surgery

## 2022-03-14 DIAGNOSIS — M5412 Radiculopathy, cervical region: Secondary | ICD-10-CM

## 2022-03-18 ENCOUNTER — Other Ambulatory Visit: Payer: Self-pay

## 2022-03-18 MED ORDER — FLECAINIDE ACETATE 50 MG PO TABS: 50.0000 mg | ORAL_TABLET | Freq: Two times a day (BID) | ORAL | 1 refills | Status: DC

## 2022-03-29 ENCOUNTER — Ambulatory Visit
Admission: RE | Admit: 2022-03-29 | Discharge: 2022-03-29 | Disposition: A | Discharge status: Home / Self Care | Payer: Medicare Other | Source: Ambulatory Visit | Attending: Orthopaedic Surgery | Admitting: Orthopaedic Surgery

## 2022-03-29 DIAGNOSIS — M4802 Spinal stenosis, cervical region: Secondary | ICD-10-CM | POA: Diagnosis not present

## 2022-03-29 DIAGNOSIS — M5412 Radiculopathy, cervical region: Secondary | ICD-10-CM

## 2022-03-29 DIAGNOSIS — M542 Cervicalgia: Secondary | ICD-10-CM | POA: Diagnosis not present

## 2022-03-29 DIAGNOSIS — M4312 Spondylolisthesis, cervical region: Secondary | ICD-10-CM | POA: Diagnosis not present

## 2022-04-03 DIAGNOSIS — M47892 Other spondylosis, cervical region: Secondary | ICD-10-CM | POA: Diagnosis not present

## 2022-04-03 DIAGNOSIS — Z20822 Contact with and (suspected) exposure to covid-19: Secondary | ICD-10-CM | POA: Diagnosis not present

## 2022-04-09 DIAGNOSIS — M47812 Spondylosis without myelopathy or radiculopathy, cervical region: Secondary | ICD-10-CM | POA: Diagnosis not present

## 2022-04-24 DIAGNOSIS — M47812 Spondylosis without myelopathy or radiculopathy, cervical region: Secondary | ICD-10-CM | POA: Diagnosis not present

## 2022-04-26 DIAGNOSIS — M47812 Spondylosis without myelopathy or radiculopathy, cervical region: Secondary | ICD-10-CM | POA: Diagnosis not present

## 2022-04-29 DIAGNOSIS — M47812 Spondylosis without myelopathy or radiculopathy, cervical region: Secondary | ICD-10-CM | POA: Diagnosis not present

## 2022-05-01 DIAGNOSIS — M47812 Spondylosis without myelopathy or radiculopathy, cervical region: Secondary | ICD-10-CM | POA: Diagnosis not present

## 2022-05-06 DIAGNOSIS — M47812 Spondylosis without myelopathy or radiculopathy, cervical region: Secondary | ICD-10-CM | POA: Diagnosis not present

## 2022-05-08 DIAGNOSIS — M47812 Spondylosis without myelopathy or radiculopathy, cervical region: Secondary | ICD-10-CM | POA: Diagnosis not present

## 2022-05-13 DIAGNOSIS — M47812 Spondylosis without myelopathy or radiculopathy, cervical region: Secondary | ICD-10-CM | POA: Diagnosis not present

## 2022-05-17 DIAGNOSIS — M47812 Spondylosis without myelopathy or radiculopathy, cervical region: Secondary | ICD-10-CM | POA: Diagnosis not present

## 2022-05-20 DIAGNOSIS — M47812 Spondylosis without myelopathy or radiculopathy, cervical region: Secondary | ICD-10-CM | POA: Diagnosis not present

## 2022-05-23 DIAGNOSIS — M47812 Spondylosis without myelopathy or radiculopathy, cervical region: Secondary | ICD-10-CM | POA: Diagnosis not present

## 2022-05-27 DIAGNOSIS — M47812 Spondylosis without myelopathy or radiculopathy, cervical region: Secondary | ICD-10-CM | POA: Diagnosis not present

## 2022-05-30 DIAGNOSIS — M47812 Spondylosis without myelopathy or radiculopathy, cervical region: Secondary | ICD-10-CM | POA: Diagnosis not present

## 2022-07-04 ENCOUNTER — Other Ambulatory Visit: Payer: Self-pay | Admitting: Cardiology

## 2022-07-04 NOTE — Telephone Encounter (Signed)
Prescription refill request for Eliquis received. Indication:Afib Last office visit:11/22 Scr:1.0 Age: 76 Weight:73 kg  Prescription refilled

## 2022-08-14 ENCOUNTER — Other Ambulatory Visit: Payer: Self-pay | Admitting: *Deleted

## 2022-08-14 DIAGNOSIS — E7800 Pure hypercholesterolemia, unspecified: Secondary | ICD-10-CM

## 2022-08-22 ENCOUNTER — Other Ambulatory Visit: Payer: Self-pay | Admitting: Cardiology

## 2022-09-11 ENCOUNTER — Ambulatory Visit: Payer: Medicare Other | Attending: Cardiology | Admitting: Cardiology

## 2022-09-11 ENCOUNTER — Encounter: Payer: Self-pay | Admitting: Cardiology

## 2022-09-11 VITALS — BP 138/72 | HR 64 | Ht 69.0 in | Wt 165.6 lb

## 2022-09-11 DIAGNOSIS — I480 Paroxysmal atrial fibrillation: Secondary | ICD-10-CM | POA: Diagnosis not present

## 2022-09-11 DIAGNOSIS — Z7901 Long term (current) use of anticoagulants: Secondary | ICD-10-CM | POA: Diagnosis not present

## 2022-09-11 NOTE — Progress Notes (Signed)
Cardiology Office Note:    Date:  09/11/2022   ID:  David Wright, DOB 27-May-1946, MRN [id number]  PCP:  Seward Carol, MD   [facility] Providers Cardiologist:  Candee Furbish, MD     Referring MD: Seward Carol, MD    History of Present Illness:    David Wright is a 76 y.o. male here for follow-up of paroxysmal atrial fibrillation.  Previously had a very low PAF burden.  Approximately 3 -5 times over the past year, mostly at night. TV, feels it come on.  Symptomatic.  Takes an additional flecainide and breaks.  Over the past year it has been more and more frequent.  On Eliquis with minor bruising.  No significant bleeding.  Over the last year however he has had more frequent episodes of atrial fibrillation lasting longer now sometimes up to 8 hours.  He gave me a list of dates, June 19, 2028, July 07, 2024, September 4, 7, 8, 18, 20, 30, October 11.  Symptomatic with this.   1-2 years ago had a bizzarre episode where he was having trouble with memory, transient. Got better.  Dr. Delfina Redwood is his neighbor.  Has not had any issues since.  His wife [name] has been present.  Has had an SVT ablation that may or may not of been successful in the past.  Remembers her being treated at the [facility] clinic.    Past Medical History:  Diagnosis Date   Anginal pain ([place])    has been worked-up but all neg.   Arthritis    Asthma    well controlled   Atrial tachycardia, paroxysmal    Complication of anesthesia 40 yrs.ago   spinal headache   Dysrhythmia    A-Fibrillation   Headache    Hypercholesteremia    pt reports that his cholesterol is normal   PAC (premature atrial contraction)    Paroxysmal atrial fibrillation (HCC)    Snores    Spinal headache    40 years ago    Past Surgical History:  Procedure Laterality Date   COLONOSCOPY WITH PROPOFOL N/A 09/23/2016   Procedure: COLONOSCOPY WITH PROPOFOL;  Surgeon: Garlan Fair, MD;  Location: [facility];  Service:  Endoscopy;  Laterality: N/A;   EYE SURGERY     age 12   INGUINAL HERNIA REPAIR Right 12/04/2016   Procedure: LAPAROSCOPIC RIGHT INGUINAL HERNIA WITH MESH;  Surgeon: Coralie Keens, MD;  Location: [place];  Service: General;  Laterality: Right;   INSERTION OF MESH Right 12/04/2016   Procedure: INSERTION OF MESH;  Surgeon: Coralie Keens, MD;  Location: [place];  Service: General;  Laterality: Right;   JOINT REPLACEMENT     perianal fistula     had spinal anesthesia and had spinal headache after surgery, 40 years ago   [place]  10/27/2012   RIGHT HIP    Current Medications: Current Meds  Medication Sig   alclomethasone (ACLOVATE) 0.05 % cream as needed.   ELIQUIS 5 MG TABS tablet TAKE 1 TABLET TWICE A DAY   flecainide (TAMBOCOR) 50 MG tablet TAKE 1 TABLET TWICE A DAY   fluticasone (FLONASE) 50 MCG/ACT nasal spray Place 2 sprays into the nose daily.   Fluticasone-Salmeterol (ADVAIR) 100-50 MCG/DOSE AEPB Inhale 1 puff into the lungs daily.   levalbuterol (XOPENEX HFA) 45 MCG/ACT inhaler as needed.    metoprolol succinate (TOPROL-XL) 25 MG 24 hr tablet TAKE ONE-HALF (1/2) TABLET AT BEDTIME   sildenafil (VIAGRA) 100  MG tablet Take 1 tablet (100 mg total) by mouth daily as needed for erectile dysfunction.     Allergies:   No known allergies   Social History   Socioeconomic History   Marital status: Married    Spouse name: Not on file   Number of children: Not on file   Years of education: Not on file   Highest education level: Not on file  Occupational History   Not on file  Tobacco Use   Smoking status: Never   Smokeless tobacco: Never  Vaping Use   Vaping Use: Never used  Substance and Sexual Activity   Alcohol use: Yes    Alcohol/week: 0.0 standard drinks of alcohol    Comment: occasionally a drink per week or less   Drug use: No   Sexual activity: Not on file  Other Topics Concern   Not on file  Social History Narrative   Pt lives in  [place] with spouse.   Retired Nature conservation officer for [employer] Strain: Not on [employer] Insecurity: Not on file  Transportation Needs: Not on file  Physical Activity: Not on file  Stress: Not on file  Social Connections: Not on file     Family History: The patient's family history includes Heart attack in his father and mother; Heart disease in his father and mother; Pancreatic cancer in his sister.  ROS:   Please see the history of present illness.    No fevers chills nausea vomiting syncope bleeding all other systems reviewed and are negative.  EKGs/Labs/Other Studies Reviewed:    The following studies were reviewed today: Echocardiogram 2022:   1. Left ventricular ejection fraction, by estimation, is 55 to 60%. The  left ventricle has normal function. The left ventricle has no regional  wall motion abnormalities. Left ventricular diastolic parameters are  consistent with Grade I diastolic  dysfunction (impaired relaxation).   2. Right ventricular systolic function is normal. The right ventricular  size is mildly enlarged. Tricuspid regurgitation signal is inadequate for  assessing PA pressure.   3. Borderline bileaflet mitral valve prolapse. The mitral valve is  grossly normal. Trivial mitral valve regurgitation. No evidence of mitral  stenosis.   4. The aortic valve is grossly normal. There is mild calcification of the  aortic valve. Aortic valve regurgitation is trivial. No aortic stenosis is  present.   5. Pulmonic valve regurgitation is moderate.   6. The inferior vena cava is normal in size with greater than 50%  respiratory variability, suggesting right atrial pressure of 3 mmHg  ECHO 2017:  - Left ventricle: The cavity size was normal. Wall thickness was    increased in a pattern of mild LVH. The estimated ejection    fraction was 55%. Wall motion was normal; there were no regional     wall motion abnormalities. Doppler parameters are consistent with    abnormal left ventricular relaxation (grade 1 diastolic    dysfunction).  - Aortic valve: There was no stenosis.  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 Aortic    root dimension: 38 mm (ED). Ascending aortic diameter: 38 mm (S).  - Mitral valve: Mildly calcified annulus. There was trivial    regurgitation.  - Right ventricle: The cavity size was normal. Systolic function    was normal.  - Tricuspid valve: Peak RV-RA gradient (S): 25 mm Hg.  - Pulmonary arteries: PA peak pressure: 28 mm  Hg (S).  - Inferior vena cava: The vessel was normal in size. The    respirophasic diameter changes were in the normal range (>= 50%),    consistent with normal central venous pressure.   EKG:  EKG is  ordered today.  The ekg ordered today demonstrates sinus bradycardia 58 low voltage, QRS duration 86 ms on flecainide, prior sinus bradycardia 59 QRS duration 82 ms  Recent Labs: No results found for requested labs within last 365 days.  Recent Lipid Panel    Component Value Date/Time   CHOL (H) 01/24/2011 1714    232        ATP III CLASSIFICATION:  <200     mg/dL   Desirable  200-239  mg/dL   Borderline High  >=240    mg/dL   High          TRIG 172 (H) 01/24/2011 1714   HDL 58 01/24/2011 1714   CHOLHDL 4.0 01/24/2011 1714   VLDL 34 01/24/2011 1714   LDLCALC (H) 01/24/2011 1714    140        Total Cholesterol/HDL:CHD Risk Coronary Heart Disease Risk Table                     Men   Women  1/2 Average Risk   3.4   3.3  Average Risk       5.0   4.4  2 X Average Risk   9.6   7.1  3 X Average Risk  23.4   11.0        Use the calculated Patient Ratio above and the CHD Risk Table to determine the patient's CHD Risk.        ATP III CLASSIFICATION (LDL):  <100     mg/dL   Optimal  100-129  mg/dL   Near or Above                    Optimal  130-159  mg/dL   Borderline  160-189  mg/dL   High  >190     mg/dL    Very High     Risk Assessment/Calculations:    CHA2DS2-VASc Score = 2   This indicates a 2.2% annual risk of stroke. The patient's score is based upon: CHF History: 0 HTN History: 0 Diabetes History: 0 Stroke History: 0 Vascular Disease History: 0 Age Score: 2 Gender Score: 0          Physical Exam:    VS:  BP 138/72   Pulse 64   Ht '5\' 9"'$  (1.753 m)   Wt 165 lb 9.6 oz (75.1 kg)   SpO2 92%   BMI 24.45 kg/m     Wt Readings from Last 3 Encounters:  09/11/22 165 lb 9.6 oz (75.1 kg)  10/05/21 161 lb (73 kg)  09/26/20 164 lb (74.4 kg)     GEN:  Well nourished, well developed in no acute distress HEENT: Normal NECK: No JVD; No carotid bruits LYMPHATICS: No lymphadenopathy CARDIAC: RRR, no murmurs, rubs, gallops RESPIRATORY:  Clear to auscultation without rales, wheezing or rhonchi  ABDOMEN: Soft, non-tender, non-distended MUSCULOSKELETAL:  No edema; No deformity  SKIN: Warm and dry NEUROLOGIC:  Alert and oriented x 3 PSYCHIATRIC:  Normal affect   ASSESSMENT:    1. Paroxysmal atrial fibrillation (HCC)   2. Chronic anticoagulation     PLAN:    In order of problems listed above:  Paroxysmal atrial fibrillation ([place]) At last year's visit  we discussed potential EP discussion for ablation given his symptomatic atrial fibrillation.  I also mention the possibility of watchman.  He is having more episodes now especially when getting up to urinate at night he can feel it.  Sometimes they last for 8 hours duration now compared to 2.  He gave me a list of dates that A-fib occurred.  He has a cardia device to confirm.  Heart rates can be in the 130s during episodes.  He sometimes doubles up on his medication when he feels it.  Continuing with Eliquis and flecainide.  I expressed the importance of him talking with our EP colleagues.  I would hate for him to miss the opportunity to have a successful or more efficacious atrial fibrillation ablation.  I did explain to him that over  time he will push towards permanency of atrial fibrillation.  He feels so poorly with that he states that he would take for this to happen.  Expressed the importance of talking with EP in the situation.   Chronic anticoagulation Continue with Eliquis 5 mg twice a day.  Prior hemoglobin reassuring from outside labs.  Hemoglobin 15.9 creatinine 1.05.   Erectile dysfunction At prior visit gave him Viagra 100 mg.  He may break this tablet in half or potentially quarters when first utilizing.  We will give him 6 tablets with refills.  Printed out prescription.  Blood pressure should be able to support this.   Prevention Has an upcoming calcium score at [place] [facility].    Medication Adjustments/Labs and Tests Ordered: Current medicines are reviewed at length with the patient today.  Concerns regarding medicines are outlined above.  Orders Placed This Encounter  Procedures   Ambulatory referral to Cardiac Electrophysiology   EKG 12-Lead    No orders of the defined types were placed in this encounter.    Patient Instructions  Medication Instructions:  The current medical regimen is effective;  continue present plan and medications.  *If you need a refill on your cardiac medications before your next appointment, please call your pharmacy*  You have been referred to Electrophysiology to discuss treatment/management of At Fib.  Follow-Up: At [facility], you and your health needs are our priority.  As part of our continuing mission to provide you with exceptional heart care, we have created designated Provider Care Teams.  These Care Teams include your primary Cardiologist (physician) and Advanced Practice Providers (APPs -  Physician Assistants and Nurse Practitioners) who all work together to provide you with the care you need, when you need it.  We recommend signing up for the patient portal called "MyChart".  Sign up information is provided on this After Visit Summary.  MyChart is  used to connect with patients for Virtual Visits (Telemedicine).  Patients are able to view lab/test results, encounter notes, upcoming appointments, etc.  Non-urgent messages can be sent to your provider as well.   To learn more about what you can do with MyChart, go to [email or web address].    Your next appointment:   6 month(s)  The format for your next appointment:   In Person  Provider:   Candee Furbish, MD      Important Information About Sugar         Signed, Candee Furbish, MD  09/11/2022 10:30 AM    [place]

## 2022-09-11 NOTE — Patient Instructions (Signed)
Medication Instructions:  The current medical regimen is effective;  continue present plan and medications.  *If you need a refill on your cardiac medications before your next appointment, please call your pharmacy*  You have been referred to Electrophysiology to discuss treatment/management of At Fib.  Follow-Up: At [facility], you and your health needs are our priority.  As part of our continuing mission to provide you with exceptional heart care, we have created designated Provider Care Teams.  These Care Teams include your primary Cardiologist (physician) and Advanced Practice Providers (APPs -  Physician Assistants and Nurse Practitioners) who all work together to provide you with the care you need, when you need it.  We recommend signing up for the patient portal called "MyChart".  Sign up information is provided on this After Visit Summary.  MyChart is used to connect with patients for Virtual Visits (Telemedicine).  Patients are able to view lab/test results, encounter notes, upcoming appointments, etc.  Non-urgent messages can be sent to your provider as well.   To learn more about what you can do with MyChart, go to [email or web address].    Your next appointment:   6 month(s)  The format for your next appointment:   In Person  Provider:   Candee Furbish, MD      Important Information About Sugar

## 2022-09-20 ENCOUNTER — Ambulatory Visit (HOSPITAL_BASED_OUTPATIENT_CLINIC_OR_DEPARTMENT_OTHER)
Admission: RE | Admit: 2022-09-20 | Discharge: 2022-09-20 | Disposition: A | Discharge status: Home / Self Care | Payer: Medicare Other | Source: Ambulatory Visit | Attending: Cardiology | Admitting: Cardiology

## 2022-09-20 DIAGNOSIS — E7800 Pure hypercholesterolemia, unspecified: Secondary | ICD-10-CM | POA: Insufficient documentation

## 2022-09-25 ENCOUNTER — Telehealth: Payer: Self-pay | Admitting: *Deleted

## 2022-09-25 DIAGNOSIS — Z79899 Other long term (current) drug therapy: Secondary | ICD-10-CM

## 2022-09-25 DIAGNOSIS — E7800 Pure hypercholesterolemia, unspecified: Secondary | ICD-10-CM

## 2022-09-25 MED ORDER — ROSUVASTATIN CALCIUM 10 MG PO TABS: 10.0000 mg | ORAL_TABLET | Freq: Every day | ORAL | 3 refills | Status: DC

## 2022-09-25 NOTE — Telephone Encounter (Signed)
Coronary calcium score of 914. This was 72nd percentile for age-,  race-, and sex-matched controls.  Let's start Crestor '10mg'$  once a day  Check lipid panel and ALT in 2 months  No aspirin because of Eliquis.  Candee Furbish, MD   Pt aware of results and above orders.  RX sent into pharmacy of choice.  Lab ordered and scheduled.

## 2022-10-10 ENCOUNTER — Ambulatory Visit: Payer: Medicare Other | Attending: Cardiovascular Disease | Admitting: Cardiovascular Disease

## 2022-10-10 ENCOUNTER — Encounter: Payer: Self-pay | Admitting: Cardiovascular Disease

## 2022-10-10 VITALS — BP 114/66 | HR 60 | Ht 69.0 in | Wt 165.2 lb

## 2022-10-10 DIAGNOSIS — I480 Paroxysmal atrial fibrillation: Secondary | ICD-10-CM

## 2022-10-10 NOTE — Patient Instructions (Signed)
Medication Instructions:  Your physician recommends that you continue on your current medications as directed. Please refer to the Current Medication list given to you today.  *If you need a refill on your cardiac medications before your next appointment, please call your pharmacy*  Follow-Up: At [facility], you and your health needs are our priority.  As part of our continuing mission to provide you with exceptional heart care, we have created designated Provider Care Teams.  These Care Teams include your primary Cardiologist (physician) and Advanced Practice Providers (APPs -  Physician Assistants and Nurse Practitioners) who all work together to provide you with the care you need, when you need it.  Your next appointment:   6 month(s)  The format for your next appointment:   In Person  Provider:   Doralee Albino, MD  Important Information About Sugar

## 2022-10-10 NOTE — Progress Notes (Signed)
Electrophysiology Office Note:    Date:  10/10/2022   ID:  Torben Soloway, DOB 1946-03-29, MRN [id number]  PCP:  Seward Carol, MD   [place] Providers Cardiologist:  Candee Furbish, MD Electrophysiologist:  Melida Quitter, MD     Referring MD: Jerline Pain, MD   Chief complaint: PAF  History of Present Illness:    Mandela Bello is a 76 y.o. male with a hx of HLD, AF referred for arrhythmia management.  He has symptomatic paroxysmal AF. His burden has historically been very low, a few episodes a year. He has been managed with flecainide, metoprolol, and apixaban.   He has been having a few more episodes than usual recently. He will often take an extra tablet of flecainide. The episodes may last a few minutes or hours. He knows immediately when he has AF and becomes more fatigued and short of breath.  He denies any recent chest pain. He has not had syncope, pre-syncope.    Past Medical History:  Diagnosis Date   Anginal pain ([place])    has been worked-up but all neg.   Arthritis    Asthma    well controlled   Atrial tachycardia, paroxysmal    Complication of anesthesia 40 yrs.ago   spinal headache   Dysrhythmia    A-Fibrillation   Headache    Hypercholesteremia    pt reports that his cholesterol is normal   PAC (premature atrial contraction)    Paroxysmal atrial fibrillation (HCC)    Snores    Spinal headache    40 years ago    Past Surgical History:  Procedure Laterality Date   COLONOSCOPY WITH PROPOFOL N/A 09/23/2016   Procedure: COLONOSCOPY WITH PROPOFOL;  Surgeon: Garlan Fair, MD;  [facility];  Service: Endoscopy;  Laterality: N/A;   EYE SURGERY     age 52   INGUINAL HERNIA REPAIR Right 12/04/2016   Procedure: LAPAROSCOPIC RIGHT INGUINAL HERNIA WITH MESH;  Surgeon: Coralie Keens, MD;  Location: [place];  Service: General;  Laterality: Right;   INSERTION OF MESH Right 12/04/2016   Procedure: INSERTION OF MESH;  Surgeon: Coralie Keens, MD;  Location: [place];  Service: General;  Laterality: Right;   JOINT REPLACEMENT     perianal fistula     had spinal anesthesia and had spinal headache after surgery, 40 years ago   [place]  10/27/2012   RIGHT HIP    Current Medications: No outpatient medications have been marked as taking for the 10/10/22 encounter (Office Visit) with Terrie Grajales, Yetta Barre, MD.     Allergies:   No known allergies   Social History   Socioeconomic History   Marital status: Married    Spouse name: Not on file   Number of children: Not on file   Years of education: Not on file   Highest education level: Not on file  Occupational History   Not on file  Tobacco Use   Smoking status: Never   Smokeless tobacco: Never  Vaping Use   Vaping Use: Never used  Substance and Sexual Activity   Alcohol use: Yes    Alcohol/week: 0.0 standard drinks of alcohol    Comment: occasionally a drink per week or less   Drug use: No   Sexual activity: Not on file  Other Topics Concern   Not on file  Social History Narrative   Pt lives in [place] with spouse.   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  ROS:   Please see the history of present illness.    All other systems reviewed and are negative.  EKGs/Labs/Other Studies Reviewed Today:     CT cardiac calcium score: 09/23/2022 914, 72 %ile. (Started rosuvastatin)  EKG:  Last EKG results: today - sinus rhythm   Recent Labs: No results found for requested labs within last 365 days.     Physical  Exam:    VS:  BP 114/66   Pulse 60   Ht '5\' 9"'$  (1.753 m)   Wt 165 lb 3.2 oz (74.9 kg)   SpO2 96%   BMI 24.40 kg/m     Wt Readings from Last 3 Encounters:  10/10/22 165 lb 3.2 oz (74.9 kg)  09/11/22 165 lb 9.6 oz (75.1 kg)  10/05/21 161 lb (73 kg)     GEN: Well nourished, well developed in no acute distress CARDIAC: RRR, no murmurs, rubs, gallops RESPIRATORY:  Normal work of breathing MUSCULOSKELETAL: no edema    ASSESSMENT & PLAN:    Paroxysmal atrial fibrillation: symptomatic. Failed flecainide though burden is low. I am concerned that the recent CT calcium score indicates significant CAD, which would also be a contraindication for flecainide. I explained this to the patient, and I described the alternative procedure, pulmonary vein isolation for AF. At this point, he wants to consider what to do.  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 On Eliquis High risk medication: flecainide. ECG ok today renal function ok this month         Medication Adjustments/Labs and Tests Ordered: Current medicines are reviewed at length with the patient today.  Concerns regarding medicines are outlined above.  Orders Placed This Encounter  Procedures   EKG 12-Lead   No orders of the defined types were placed in this encounter.    Signed, Melida Quitter, MD  10/10/2022 3:37 PM    [place]

## 2022-10-15 DIAGNOSIS — J4530 Mild persistent asthma, uncomplicated: Secondary | ICD-10-CM | POA: Diagnosis not present

## 2022-10-15 DIAGNOSIS — J3089 Other allergic rhinitis: Secondary | ICD-10-CM | POA: Diagnosis not present

## 2022-10-15 DIAGNOSIS — H1045 Other chronic allergic conjunctivitis: Secondary | ICD-10-CM | POA: Diagnosis not present

## 2022-10-15 DIAGNOSIS — J301 Allergic rhinitis due to pollen: Secondary | ICD-10-CM | POA: Diagnosis not present

## 2022-10-19 ENCOUNTER — Other Ambulatory Visit: Payer: Self-pay

## 2022-10-19 ENCOUNTER — Emergency Department (HOSPITAL_COMMUNITY)
Admission: EM | Admit: 2022-10-19 | Discharge: 2022-10-20 | Disposition: A | Discharge status: Home / Self Care | Payer: Medicare Other | Attending: Emergency Medicine | Admitting: Emergency Medicine

## 2022-10-19 ENCOUNTER — Emergency Department (HOSPITAL_COMMUNITY): Payer: Medicare Other

## 2022-10-19 DIAGNOSIS — I480 Paroxysmal atrial fibrillation: Secondary | ICD-10-CM | POA: Insufficient documentation

## 2022-10-19 DIAGNOSIS — Z7901 Long term (current) use of anticoagulants: Secondary | ICD-10-CM | POA: Insufficient documentation

## 2022-10-19 DIAGNOSIS — R079 Chest pain, unspecified: Secondary | ICD-10-CM | POA: Diagnosis not present

## 2022-10-19 DIAGNOSIS — R091 Pleurisy: Secondary | ICD-10-CM

## 2022-10-19 LAB — BASIC METABOLIC PANEL
Anion gap: 10 (ref 5–15)
BUN: 20 mg/dL (ref 8–23)
CO2: 22 mmol/L (ref 22–32)
Calcium: 9.2 mg/dL (ref 8.9–10.3)
Chloride: 108 mmol/L (ref 98–111)
Creatinine, Ser: 1.16 mg/dL (ref 0.61–1.24)
GFR, Estimated: >60 mL/min (ref >60)
Glucose, Bld: 103 mg/dL — ABNORMAL HIGH (ref 70–99)
Potassium: 4.2 mmol/L (ref 3.5–5.1)
Sodium: 140 mmol/L (ref 135–145)

## 2022-10-19 LAB — CBC
HCT: 46.6 % (ref 39.0–52.0)
Hemoglobin: 16 g/dL (ref 13.0–17.0)
MCH: 31.9 pg (ref 26.0–34.0)
MCHC: 34.3 g/dL (ref 30.0–36.0)
MCV: 92.8 fL (ref 80.0–100.0)
Platelets: 225 10*3/uL (ref 150–400)
RBC: 5.02 MIL/uL (ref 4.22–5.81)
RDW: 12.9 % (ref 11.5–15.5)
WBC: 9.8 10*3/uL (ref 4.0–10.5)
nRBC: 0 % (ref 0.0–0.2)

## 2022-10-19 LAB — TROPONIN I (HIGH SENSITIVITY): Troponin I (High Sensitivity): 7 ng/L

## 2022-10-19 NOTE — ED Provider Triage Note (Signed)
Emergency Medicine Provider Triage Evaluation Note  David Wright , a 76 y.o. male  was evaluated in triage.  Pt complains of left sided chest pressure and some shortness of breath. Hx of afib, feels he's been in afib since 8am this morning. Took his prescribed flecainide and metoprolol and still isn't feeling well. Started to feel lightheaded which prompted his presentation to the ER. Is supposed to be following up with his cardiologist to discuss possible ablation. On eliquis, no missed doses.  Review of Systems  Positive: CP, DOE, lightheadedness (now resolved) Negative: headache  Physical Exam  There were no vitals taken for this visit. Gen:   Awake, no distress   Resp:  Normal effort  MSK:   Moves extremities without difficulty  Other:    Medical Decision Making  Medically screening exam initiated at 10:49 PM.  Appropriate orders placed.  Damarious Holtsclaw was informed that the remainder of the evaluation will be completed by another provider, this initial triage assessment does not replace that evaluation, and the importance of remaining in the ED until their evaluation is complete.  Workup initiated. EKG shows afib RVR. Nursing staff notified patient will need next room.   Sena Hoopingarner T, PA-C 10/19/22 2249

## 2022-10-19 NOTE — ED Provider Notes (Signed)
[facility] EMERGENCY DEPARTMENT Provider Note   CSN: [id number] Arrival date & time: 10/19/22  2236     History {Add pertinent medical, surgical, social history, OB history to HPI:1} Chief Complaint  Patient presents with   Chest Pain / AFib     David Wright is a 76 y.o. male.  The history is provided by the patient.  Patient presents because he is feels like he is back in atrial fibrillation.      Home Medications Prior to Admission medications   Medication Sig Start Date End Date Taking? Authorizing Provider  alclomethasone (ACLOVATE) 0.05 % cream as needed. 11/22/19   [provider]  ELIQUIS 5 MG TABS tablet TAKE 1 TABLET TWICE A DAY 07/04/22   Jerline Pain, MD  flecainide (TAMBOCOR) 50 MG tablet TAKE 1 TABLET TWICE A DAY 08/22/22   Jerline Pain, MD  fluticasone (FLONASE) 50 MCG/ACT nasal spray Place 2 sprays into the nose daily.    [provider]  Fluticasone-Salmeterol (ADVAIR) 100-50 MCG/DOSE AEPB Inhale 1 puff into the lungs daily.    [provider]  levalbuterol Penne Lash HFA) 45 MCG/ACT inhaler as needed.  10/26/18   [provider]  metoprolol succinate (TOPROL-XL) 25 MG 24 hr tablet TAKE ONE-HALF (1/2) TABLET AT BEDTIME 12/07/21   Jerline Pain, MD  rosuvastatin (CRESTOR) 10 MG tablet Take 1 tablet (10 mg total) by mouth daily. 09/25/22   Jerline Pain, MD  sildenafil (VIAGRA) 100 MG tablet Take 1 tablet (100 mg total) by mouth daily as needed for erectile dysfunction. 10/05/21   Jerline Pain, MD      Allergies    No known allergies    Review of Systems   Review of Systems  Physical Exam Updated Vital Signs BP (!) 151/101 (BP Location: Right Arm)   Pulse (!) 108   Temp 99.2 F (37.3 C) (Oral)   Resp 18   SpO2 97%  Physical Exam  ED Results / Procedures / Treatments   Labs (all labs ordered are listed, but only abnormal results are displayed) Labs Reviewed  BASIC METABOLIC PANEL - Abnormal;  Notable for the following components:      Result Value   Glucose, Bld 103 (*)    All other components within normal limits  CBC  D-DIMER, QUANTITATIVE  TROPONIN I (HIGH SENSITIVITY)    EKG EKG Interpretation  Date/Time:  Saturday October 19 2022 22:36:29 EST Ventricular Rate:  136 PR Interval:    QRS Duration: 86 QT Interval:  334 QTC Calculation: 502 R Axis:   58 Text Interpretation: Atrial fibrillation with rapid ventricular response Low voltage QRS Cannot rule out Anterior infarct , age undetermined T wave abnormality, consider inferior ischemia Abnormal ECG Confirmed by Ripley Fraise [phone]) on 10/19/2022 11:30:44 PM  Radiology DG Chest 2 View  Result Date: 10/19/2022 CLINICAL DATA:  Chest pain EXAM: CHEST - 2 VIEW COMPARISON:  05/18/2021 FINDINGS: Cardiac shadow is at the upper limits of normal in size. Mild aortic calcifications are noted. Lungs are well aerated bilaterally without focal infiltrate or effusion. No bony abnormality is seen. IMPRESSION: No active cardiopulmonary disease. Electronically Signed   By: Inez Catalina M.D.   On: 10/19/2022 23:08    Procedures Procedures  {Document cardiac monitor, telemetry assessment procedure when appropriate:1}  Medications Ordered in ED Medications - No data to display  ED Course/ Medical Decision Making/ A&P Clinical Course as of 10/19/22 2359  Sat Oct 19, 2022  2359  Patient now back in sinus rhythm.  He feels much improved.  He still reports dull ache in his chest when he takes a deep breath.  Will obtain D-dimer.  Will also recheck troponin [DW]    Clinical Course User Index [DW] Ripley Fraise, MD                           Medical Decision Making Amount and/or Complexity of Data Reviewed Labs: ordered. ECG/medicine tests: ordered.   ***  {Document critical care time when appropriate:1} {Document review of labs and clinical decision tools ie heart score, Chads2Vasc2 etc:1}  {Document your independent  review of radiology images, and any outside records:1} {Document your discussion with family members, caretakers, and with consultants:1} {Document social determinants of health affecting pt's care:1} {Document your decision making why or why not admission, treatments were needed:1} Final Clinical Impression(s) / ED Diagnoses Final diagnoses:  None    Rx / DC Orders ED Discharge Orders     None

## 2022-10-19 NOTE — ED Triage Notes (Signed)
Patient reports left chest pain with Afib , lightheadedness and mild SOB  onset this morning , he took his Metropolol and Flecanide this evening , his cardiologist is Dr. Etter Sjogren.

## 2022-10-20 DIAGNOSIS — I480 Paroxysmal atrial fibrillation: Secondary | ICD-10-CM | POA: Diagnosis not present

## 2022-10-20 LAB — D-DIMER, QUANTITATIVE: D-Dimer, Quant: <0.27 ug/mL-FEU (ref 0.00–0.50)

## 2022-10-20 LAB — TROPONIN I (HIGH SENSITIVITY): Troponin I (High Sensitivity): 6 ng/L (ref <18)

## 2022-10-21 ENCOUNTER — Encounter: Payer: Self-pay | Admitting: Cardiology

## 2022-10-21 ENCOUNTER — Ambulatory Visit: Payer: Medicare Other | Attending: Cardiology | Admitting: Cardiology

## 2022-10-21 ENCOUNTER — Telehealth: Payer: Self-pay | Admitting: Cardiology

## 2022-10-21 VITALS — BP 102/50 | HR 100 | Ht 69.0 in | Wt 165.8 lb

## 2022-10-21 DIAGNOSIS — Z7901 Long term (current) use of anticoagulants: Secondary | ICD-10-CM | POA: Insufficient documentation

## 2022-10-21 DIAGNOSIS — I2584 Coronary atherosclerosis due to calcified coronary lesion: Secondary | ICD-10-CM | POA: Insufficient documentation

## 2022-10-21 DIAGNOSIS — I2510 Atherosclerotic heart disease of native coronary artery without angina pectoris: Secondary | ICD-10-CM | POA: Insufficient documentation

## 2022-10-21 DIAGNOSIS — I480 Paroxysmal atrial fibrillation: Secondary | ICD-10-CM | POA: Insufficient documentation

## 2022-10-21 MED ORDER — AMIODARONE HCL 200 MG PO TABS: 400.0000 mg | ORAL_TABLET | Freq: Two times a day (BID) | ORAL | Status: DC

## 2022-10-21 NOTE — Patient Instructions (Addendum)
Medication Instructions:  PLEASE DISCONTINUE YOUR FLECAINIDE. Please start Amiodarone 400 mg twice a day starting Tuesday evening. Continue all other medications as listed.  *If you need a refill on your cardiac medications before your next appointment, please call your pharmacy*  Lab Work: None If you have labs (blood work) drawn today and your tests are completely normal, you will receive your results only by: [place] (if you have MyChart) OR A paper copy in the mail If you have any lab test that is abnormal or we need to change your treatment, we will call you to review the results.  Follow-Up: At [facility], you and your health needs are our priority.  As part of our continuing mission to provide you with exceptional heart care, we have created designated Provider Care Teams.  These Care Teams include your primary Cardiologist (physician) and Advanced Practice Providers (APPs -  Physician Assistants and Nurse Practitioners) who all work together to provide you with the care you need, when you need it.  We recommend signing up for the patient portal called "MyChart".  Sign up information is provided on this After Visit Summary.  MyChart is used to connect with patients for Virtual Visits (Telemedicine).  Patients are able to view lab/test results, encounter notes, upcoming appointments, etc.  Non-urgent messages can be sent to your provider as well.   To learn more about what you can do with MyChart, go to [email or web address].    Your next appointment:   As scheduled on Friday (10am) The format for your next appointment:   In Person  Provider:   Candee Furbish, MD      Important Information About Sugar

## 2022-10-21 NOTE — Progress Notes (Signed)
Cardiology Office Note:    Date:  10/21/2022   ID:  David Wright, DOB [birth year], MRN [id number]  PCP:  Seward Carol, MD   [facility] Providers Cardiologist:  Candee Furbish, MD Electrophysiologist:  Melida Quitter, MD     Referring MD: Seward Carol, MD    History of Present Illness:    David Wright is a 76 y.o. male with PMHx of PAF, PAC, HLD, and asthma here for follow-up of paroxysmal atrial fibrillation.  Previously had a very low PAF burden.  Approximately 3 -5 times over the past year, mostly at night. TV, feels it come on.  Symptomatic.  Takes an additional flecainide and breaks.  On Eliquis with minor bruising.  No significant bleeding.  Over the course of 2022 however he had more frequent episodes of atrial fibrillation lasting longer sometimes up to 8 hours.  He gave me a list of dates, July 26, August 13, September 4, 7, 8, 18, 20, 30, October 11.  Symptomatic with this.  2-3 years ago had a bizzarre episode where he was having trouble with memory, transient. Got better.  Dr. Delfina Redwood is his neighbor.  Has not had any issues since.  Has had an SVT ablation that may or may not of been successful in the past.  Remembers her being treated at the [facility] clinic.  His wife, David Wright, is present for our visit. Today, he states that he has had 4 episodes of symptomatic episode of A-fib in the last 3 days. His episodes seem to be random in nature with no clear triggers. Currently he feels slight chest discomfort with deep inspiration. He attributes this to his A-fib. He states that he immediately knows when he has returned to A-fib.  He continues to have intermittent mild dizzy spells.  He has been compliant with Flecainide '50mg'$  BID. He has been on this medication for over 10 years since his first episode of A-fib. We did review other antiarrhythmics and their risk vs benefits.   He has been very compliant with his Eliquis and has not missed any doses.   He saw Dr. Myles Gip  earlier this month who expressed his concern with the pt taking Flecainide given his positive calcium score suggesting significant CAD. Dr. Myles Gip recommended ablation which patient has been considering.  Dr. Myles Gip also started him on Crestor. He has been compliant with this for ~2 weeks without any adverse side effects.  Past Medical History:  Diagnosis Date   Anginal pain ([place])    has been worked-up but all neg.   Arthritis    Asthma    well controlled   Atrial tachycardia, paroxysmal    Complication of anesthesia 40 yrs.ago   spinal headache   Dysrhythmia    A-Fibrillation   Headache    Hypercholesteremia    pt reports that his cholesterol is normal   PAC (premature atrial contraction)    Paroxysmal atrial fibrillation (HCC)    Snores    Spinal headache    40 years ago    Past Surgical History:  Procedure Laterality Date   COLONOSCOPY WITH PROPOFOL N/A 09/23/2016   Procedure: COLONOSCOPY WITH PROPOFOL;  Surgeon: Garlan Fair, MD;  Location: [facility];  Service: Endoscopy;  Laterality: N/A;   EYE SURGERY     age 56   INGUINAL HERNIA REPAIR Right 12/04/2016   Procedure: LAPAROSCOPIC RIGHT INGUINAL HERNIA WITH MESH;  Surgeon: Coralie Keens, MD;  Location: [facility];  Service: General;  Laterality: Right;   INSERTION  OF MESH Right 12/04/2016   Procedure: INSERTION OF MESH;  Surgeon: Coralie Keens, MD;  Location: [facility];  Service: General;  Laterality: Right;   JOINT REPLACEMENT     perianal fistula     had spinal anesthesia and had spinal headache after surgery, 40 years ago   [place]  10/27/2012   RIGHT HIP    Current Medications: Current Meds  Medication Sig   alclomethasone (ACLOVATE) 0.05 % cream as needed.   amiodarone (PACERONE) 200 MG tablet Take 2 tablets (400 mg total) by mouth 2 (two) times daily. Starting on Tuesday evening.   ELIQUIS 5 MG TABS tablet TAKE 1 TABLET TWICE A DAY   fluticasone (FLONASE) 50 MCG/ACT nasal  spray Place 2 sprays into the nose daily.   Fluticasone-Salmeterol (ADVAIR) 100-50 MCG/DOSE AEPB Inhale 1 puff into the lungs daily.   levalbuterol (XOPENEX HFA) 45 MCG/ACT inhaler as needed.    metoprolol succinate (TOPROL-XL) 25 MG 24 hr tablet TAKE ONE-HALF (1/2) TABLET AT BEDTIME   rosuvastatin (CRESTOR) 10 MG tablet Take 1 tablet (10 mg total) by mouth daily.   sildenafil (VIAGRA) 100 MG tablet Take 1 tablet (100 mg total) by mouth daily as needed for erectile dysfunction.   [DISCONTINUED] flecainide (TAMBOCOR) 50 MG tablet TAKE 1 TABLET TWICE A DAY     Allergies:   No known allergies   Social History   Socioeconomic History   Marital status: Married    Spouse name: Not on file   Number of children: Not on file   Years of education: Not on file   Highest education level: Not on file  Occupational History   Not on file  Tobacco Use   Smoking status: Never   Smokeless tobacco: Never  Vaping Use   Vaping Use: Never used  Substance and Sexual Activity   Alcohol use: Yes    Alcohol/week: 0.0 standard drinks of alcohol    Comment: occasionally a drink per week or less   Drug use: No   Sexual activity: Not on file  Other Topics Concern   Not on file  Social History Narrative   Pt lives in [place] with spouse.   Retired Nature conservation officer for [employer] Strain: Not on [employer] Insecurity: Not on file  Transportation Needs: Not on file  Physical Activity: Not on file  Stress: Not on file  Social Connections: Not on file     Family History: The patient's family history includes Heart attack in his father and mother; Heart disease in his father and mother; Pancreatic cancer in his sister.  ROS:   Please see the history of present illness.   + Dizziness  + Chest discomfort with deep inspiration All other systems reviewed and are negative.  EKGs/Labs/Other Studies Reviewed:    The following  studies were reviewed today:  Calcium scoring 09/20/22: FINDINGS: Coronary Calcium Score: Left main: 13 Left anterior descending artery: 472 Left circumflex artery: 155 Right coronary artery: 273 Total: 914 Percentile: 72nd Pericardium: Normal. Non-cardiac: See separate report from [facility]. IMPRESSION: 1. Coronary calcium score of 914. This was 72nd percentile for age-, race-, and sex-matched controls.  Echocardiogram 2022:  1. Left ventricular ejection fraction, by estimation, is 55 to 60%. The  left ventricle has normal function. The left ventricle has no regional  wall motion abnormalities. Left ventricular diastolic parameters are  consistent with Grade I diastolic  dysfunction (impaired relaxation).   2. Right ventricular systolic function is normal. The right ventricular  size is mildly enlarged. Tricuspid regurgitation signal is inadequate for  assessing PA pressure.   3. Borderline bileaflet mitral valve prolapse. The mitral valve is  grossly normal. Trivial mitral valve regurgitation. No evidence of mitral  stenosis.   4. The aortic valve is grossly normal. There is mild calcification of the  aortic valve. Aortic valve regurgitation is trivial. No aortic stenosis is  present.   5. Pulmonic valve regurgitation is moderate.   6. The inferior vena cava is normal in size with greater than 50%  respiratory variability, suggesting right atrial pressure of 3 mmHg  ECHO 2017: - Left ventricle: The cavity size was normal. Wall thickness was    increased in a pattern of mild LVH. The estimated ejection    fraction was 55%. Wall motion was normal; there were no regional    wall motion abnormalities. Doppler parameters are consistent with    abnormal left ventricular relaxation (grade 1 diastolic    dysfunction).  - Aortic valve: There was no stenosis.  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 Aortic    root dimension: 38 mm (ED). Ascending  aortic diameter: 38 mm (S).  - Mitral valve: Mildly calcified annulus. There was trivial    regurgitation.  - Right ventricle: The cavity size was normal. Systolic function    was normal.  - Tricuspid valve: Peak RV-RA gradient (S): 25 mm Hg.  - Pulmonary arteries: PA peak pressure: 28 mm Hg (S).  - Inferior vena cava: The vessel was normal in size. The    respirophasic diameter changes were in the normal range (>= 50%),    consistent with normal central venous pressure.   EKG: EKG from ER showed atrial fibrillation heart rate in the 130s 10/19/2022 EKG 10/10/2021: sinus bradycardia 58 low voltage, QRS duration 86 ms on flecainide, prior sinus bradycardia 59 QRS duration 82 ms  Recent Labs: 10/19/2022: BUN 20; Creatinine, Ser 1.16; Hemoglobin 16.0; Platelets 225; Potassium 4.2; Sodium 140  Recent Lipid Panel    Component Value Date/Time   CHOL (H) 01/24/2011 1714    232        ATP III CLASSIFICATION:  <200     mg/dL   Desirable  200-239  mg/dL   Borderline High  >=240    mg/dL   High          TRIG 172 (H) 01/24/2011 1714   HDL 58 01/24/2011 1714   CHOLHDL 4.0 01/24/2011 1714   VLDL 34 01/24/2011 1714   LDLCALC (H) 01/24/2011 1714    140        Total Cholesterol/HDL:CHD Risk Coronary Heart Disease Risk Table                     Men   Women  1/2 Average Risk   3.4   3.3  Average Risk       5.0   4.4  2 X Average Risk   9.6   7.1  3 X Average Risk  23.4   11.0        Use the calculated Patient Ratio above and the CHD Risk Table to determine the patient's CHD Risk.        ATP III CLASSIFICATION (LDL):  <100     mg/dL   Optimal  100-129  mg/dL   Near or Above  Optimal  130-159  mg/dL   Borderline  160-189  mg/dL   High  >190     mg/dL   Very High     Risk Assessment/Calculations:    CHA2DS2-VASc Score =     This indicates a  % annual risk of stroke. The patient's score is based upon:            Physical Exam:    VS:  BP (!) 102/50    Pulse 100   Ht '5\' 9"'$  (1.753 m)   Wt 165 lb 12.8 oz (75.2 kg)   SpO2 98%   BMI 24.48 kg/m     Wt Readings from Last 3 Encounters:  10/21/22 165 lb 12.8 oz (75.2 kg)  10/10/22 165 lb 3.2 oz (74.9 kg)  09/11/22 165 lb 9.6 oz (75.1 kg)     GEN:  Well nourished, well developed in no acute distress HEENT: Normal NECK: No JVD; No carotid bruits LYMPHATICS: No lymphadenopathy CARDIAC: Irregularly irregular rhythm, no murmurs, rubs, or gallops RESPIRATORY:  Clear to auscultation without rales, wheezing or rhonchi  ABDOMEN: Soft, non-tender, non-distended MUSCULOSKELETAL:  No edema; No deformity  SKIN: Warm and dry NEUROLOGIC:  Alert and oriented x 3 PSYCHIATRIC:  Normal affect   ASSESSMENT:    1. Paroxysmal atrial fibrillation (HCC)   2. Chronic anticoagulation   3. Coronary artery calcification      PLAN:    In order of problems listed above:  Paroxysmal atrial fibrillation [facility]) Discussed his case today personally with his EP physician Dr. Myles Gip.  Given his increased burden of atrial fibrillation, coronary artery calcification, we will stop his flecainide and we will start him on amiodarone 400 mg twice a day tomorrow evening.  He will come back and see me on Friday.  If necessary, we can always set up cardioversion.  He has not missed any doses of Eliquis.  Coronary artery calcification High calcium score 900.  Multivessel CAD.  He is experiencing some chest discomfort with his rapid atrial fibrillation.  I am concerned about the possibility of significant stenosis.  Once his atrial fibrillation is controlled, we will go ahead and set him up for cardiac catheterization.  Of course he will need to hold his Eliquis prior to this.  We discussed the risks and benefits of the procedure including stroke heart attack death renal impairment bleeding.  Willing to proceed.  His wife was present.  Wife had a cardiac catheterization with Dr. [place].   Chronic anticoagulation Continue with  Eliquis 5 mg twice a day.  Prior hemoglobin reassuring from outside labs.  No changes made.  Has not missed any doses.     Follow up: Friday  Medication Adjustments/Labs and Tests Ordered: Current medicines are reviewed at length with the patient today.  Concerns regarding medicines are outlined above.  No orders of the defined types were placed in this encounter.   Meds ordered this encounter  Medications   amiodarone (PACERONE) 200 MG tablet    Sig: Take 2 tablets (400 mg total) by mouth 2 (two) times daily. Starting on Tuesday evening.    Dispense:  120 tablet    Refill:  01     Patient Instructions  Medication Instructions:  PLEASE DISCONTINUE YOUR FLECAINIDE. Please start Amiodarone 400 mg twice a day starting Tuesday evening. Continue all other medications as listed.  *If you need a refill on your cardiac medications before your next appointment, please call your pharmacy*  Lab Work: None If you have  labs (blood work) drawn today and your tests are completely normal, you will receive your results only by: [place] (if you have MyChart) OR A paper copy in the mail If you have any lab test that is abnormal or we need to change your treatment, we will call you to review the results.  Follow-Up: At [facility], you and your health needs are our priority.  As part of our continuing mission to provide you with exceptional heart care, we have created designated Provider Care Teams.  These Care Teams include your primary Cardiologist (physician) and Advanced Practice Providers (APPs -  Physician Assistants and Nurse Practitioners) who all work together to provide you with the care you need, when you need it.  We recommend signing up for the patient portal called "MyChart".  Sign up information is provided on this After Visit Summary.  MyChart is used to connect with patients for Virtual Visits (Telemedicine).  Patients are able to view lab/test results, encounter  notes, upcoming appointments, etc.  Non-urgent messages can be sent to your provider as well.   To learn more about what you can do with MyChart, go to [email or web address].    Your next appointment:   As scheduled on Friday (10am) The format for your next appointment:   In Person  Provider:   Candee Furbish, MD      Important Information About Sugar         Signed, Candee Furbish, MD  10/21/2022 5:21 PM    [place] as a scribe for Candee Furbish, MD.,have documented all relevant documentation on the behalf of Candee Furbish, MD,as directed by  Candee Furbish, MD while in the presence of Candee Furbish, MD.  I, Candee Furbish, MD, have reviewed all documentation for this visit. The documentation on 10/21/22 for the exam, diagnosis, procedures, and orders are all accurate and complete.

## 2022-10-21 NOTE — Telephone Encounter (Signed)
I scheduled patient to see Dr. Marlou Porch for hospital f/u this afternoon at 3:00 pm

## 2022-10-22 ENCOUNTER — Telehealth: Payer: Self-pay | Admitting: Cardiology

## 2022-10-22 NOTE — Telephone Encounter (Signed)
Pt c/o medication issue:  1. Name of Medication:   amiodarone (PACERONE) 200 MG tablet    2. How are you currently taking this medication (dosage and times per day)? Take 2 tablets (400 mg total) by mouth 2 (two) times daily. Starting on Tuesday evening. - Oral   Please start Amiodarone 400 mg twice a day starting Tuesday evening.   3. Are you having a reaction (difficulty breathing--STAT)?   4. What is your medication issue? Pt needs some clarification on how he should be taking this medication.

## 2022-10-22 NOTE — Telephone Encounter (Signed)
Attempted to contact pt to  review - left message on his private voice mail to take (2) 200 mg tablets twice a day starting this evening.  (This will be a total of 400 mg twice daily)  Advised to call back if any further questions/concerns.

## 2022-10-23 NOTE — Telephone Encounter (Signed)
Pt has not called back with any further questions/concerns.  Will close this encounter.

## 2022-10-25 ENCOUNTER — Ambulatory Visit: Payer: Medicare Other | Attending: Cardiology | Admitting: Cardiology

## 2022-10-25 ENCOUNTER — Encounter: Payer: Self-pay | Admitting: Cardiology

## 2022-10-25 VITALS — BP 110/70 | HR 61 | Ht 69.0 in | Wt 166.0 lb

## 2022-10-25 DIAGNOSIS — Z7901 Long term (current) use of anticoagulants: Secondary | ICD-10-CM | POA: Diagnosis not present

## 2022-10-25 DIAGNOSIS — I2584 Coronary atherosclerosis due to calcified coronary lesion: Secondary | ICD-10-CM | POA: Diagnosis not present

## 2022-10-25 DIAGNOSIS — I2510 Atherosclerotic heart disease of native coronary artery without angina pectoris: Secondary | ICD-10-CM | POA: Insufficient documentation

## 2022-10-25 DIAGNOSIS — I480 Paroxysmal atrial fibrillation: Secondary | ICD-10-CM

## 2022-10-25 NOTE — Patient Instructions (Signed)
Medication Instructions:  Continue current medications. On Monday decrease Amiodarone to 200 mg one tablet twice a day.   *If you need a refill on your cardiac medications before your next appointment, please call your pharmacy*   Follow-Up: At [facility], you and your health needs are our priority.  As part of our continuing mission to provide you with exceptional heart care, we have created designated Provider Care Teams.  These Care Teams include your primary Cardiologist (physician) and Advanced Practice Providers (APPs -  Physician Assistants and Nurse Practitioners) who all work together to provide you with the care you need, when you need it.  We recommend signing up for the patient portal called "MyChart".  Sign up information is provided on this After Visit Summary.  MyChart is used to connect with patients for Virtual Visits (Telemedicine).  Patients are able to view lab/test results, encounter notes, upcoming appointments, etc.  Non-urgent messages can be sent to your provider as well.   To learn more about what you can do with MyChart, go to [email or web address].    Your next appointment:   As scheduled (12/15)  The format for your next appointment:   In Person  Provider:   Candee Furbish, MD      Important Information About Sugar

## 2022-10-25 NOTE — Progress Notes (Signed)
Cardiology Office Note:    Date:  10/25/2022   ID:  David Wright, DOB [birth year], MRN [id number]  PCP:  David Carol, MD   [facility] Providers Cardiologist:  David Furbish, MD Electrophysiologist:  David Quitter, MD     Referring MD: David Carol, MD    History of Present Illness:    David Wright is a 76 y.o. 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    Previously had a very low PAF burden.  Approximately 3 -5 times over the past year, mostly at night. TV, feels it come on.  Symptomatic.   On Eliquis with minor bruising.  No significant bleeding.  Over the course of 2022 however he had more frequent episodes of atrial fibrillation lasting longer sometimes up to 8 hours.  He gave me a list of dates, July 26, August 13, September 4, 7, 8, 18, 20, 30, October 11.  Symptomatic with this.  2-3 years ago had a bizzarre episode where he was having trouble with memory, transient. Got better.  Dr. Delfina Wright is his neighbor.  Has not had any issues since.  Has had an SVT ablation that may or may not of been successful in the past.  Remembers her being treated at the [facility].  His wife, [name], is present for our visit. Today, he states that he has had 4 episodes of symptomatic episode of A-fib in the last 3 days. His episodes seem to be random in nature with no clear triggers. Currently he feels slight chest discomfort with deep inspiration. He attributes this to his A-fib. He states that he immediately knows when he has returned to A-fib.  Was previously on Flecainide '50mg'$  BID for over 10 years since his first episode of A-fib.   He has been very compliant with his Eliquis and has not missed any doses.    Past Medical History:  Diagnosis Date   Anginal pain ([place])    has been worked-up but all neg.   Arthritis    Asthma    well controlled   Atrial  tachycardia, paroxysmal    Complication of anesthesia 40 yrs.ago   spinal headache   Dysrhythmia    A-Fibrillation   Headache    Hypercholesteremia    pt reports that his cholesterol is normal   PAC (premature atrial contraction)    Paroxysmal atrial fibrillation (HCC)    Snores    Spinal headache    40 years ago    Past Surgical History:  Procedure Laterality Date   COLONOSCOPY WITH PROPOFOL N/A 09/23/2016   Procedure: COLONOSCOPY WITH PROPOFOL;  Surgeon: David Fair, MD;  Location: [facility];  Service: Endoscopy;  Laterality: N/A;   EYE SURGERY     age 89   INGUINAL HERNIA REPAIR Right 12/04/2016   Procedure: LAPAROSCOPIC RIGHT INGUINAL HERNIA WITH MESH;  Surgeon: David Keens, MD;  Location: [place];  Service: General;  Laterality: Right;   INSERTION OF MESH Right 12/04/2016   Procedure: INSERTION OF MESH;  Surgeon: David Keens, MD;  Location: [place];  Service: General;  Laterality: Right;   JOINT REPLACEMENT     perianal fistula     had spinal anesthesia and had spinal headache after surgery, 40 years ago   [place]  10/27/2012   RIGHT HIP    Current Medications: Current Meds  Medication Sig   alclomethasone (ACLOVATE)  0.05 % cream as needed.   amiodarone (PACERONE) 200 MG tablet Take 2 tablets (400 mg total) by mouth 2 (two) times daily. Starting on Tuesday evening.   ELIQUIS 5 MG TABS tablet TAKE 1 TABLET TWICE A DAY   fluticasone (FLONASE) 50 MCG/ACT nasal spray Place 2 sprays into the nose daily.   Fluticasone-Salmeterol (ADVAIR) 100-50 MCG/DOSE AEPB Inhale 1 puff into the lungs daily.   levalbuterol (XOPENEX HFA) 45 MCG/ACT inhaler as needed.    metoprolol succinate (TOPROL-XL) 25 MG 24 hr tablet TAKE ONE-HALF (1/2) TABLET AT BEDTIME   rosuvastatin (CRESTOR) 10 MG tablet Take 1 tablet (10 mg total) by mouth daily.   sildenafil (VIAGRA) 100 MG tablet Take 1 tablet (100 mg total) by mouth daily as needed for erectile  dysfunction.     Allergies:   No known allergies   Social History   Socioeconomic History   Marital status: Married    Spouse name: Not on file   Number of children: Not on file   Years of education: Not on file   Highest education level: Not on file  Occupational History   Not on file  Tobacco Use   Smoking status: Never   Smokeless tobacco: Never  Vaping Use   Vaping Use: Never used  Substance and Sexual Activity   Alcohol use: Yes    Alcohol/week: 0.0 standard drinks of alcohol    Comment: occasionally a drink per week or less   Drug use: No   Sexual activity: Not on file  Other Topics Concern   Not on file  Social History Narrative   Pt lives in [place] with spouse.   Retired Nature conservation officer for [employer] Strain: Not on [employer] Insecurity: Not on file  Transportation Needs: Not on file  Physical Activity: Not on file  Stress: Not on file  Social Connections: Not on file     Family History: The patient's family history includes Heart attack in his father and mother; Heart disease in his father and mother; Pancreatic cancer in his sister.  ROS:   Please see the history of present illness.     EKGs/Labs/Other Studies Reviewed:    The following studies were reviewed today:  Calcium scoring 09/20/22: FINDINGS: Coronary Calcium Score: Left main: 13 Left anterior descending artery: 472 Left circumflex artery: 155 Right coronary artery: 273 Total: 914 Percentile: 72nd Pericardium: Normal. Non-cardiac: See separate report from [facility]. IMPRESSION: 1. Coronary calcium score of 914. This was 72nd percentile for age-, race-, and sex-matched controls.  Echocardiogram 2022:  1. Left ventricular ejection fraction, by estimation, is 55 to 60%. The  left ventricle has normal function. The left ventricle has no regional  wall motion abnormalities. Left ventricular  diastolic parameters are  consistent with Grade I diastolic  dysfunction (impaired relaxation).   2. Right ventricular systolic function is normal. The right ventricular  size is mildly enlarged. Tricuspid regurgitation signal is inadequate for  assessing PA pressure.   3. Borderline bileaflet mitral valve prolapse. The mitral valve is  grossly normal. Trivial mitral valve regurgitation. No evidence of mitral  stenosis.   4. The aortic valve is grossly normal. There is mild calcification of the  aortic valve. Aortic valve regurgitation is trivial. No aortic stenosis is  present.   5. Pulmonic valve regurgitation is moderate.   6. The inferior vena cava is normal in size with greater than 50%  respiratory  variability, suggesting right atrial pressure of 3 mmHg   EKG:  EKG 10/25/2022 shows sinus rhythm 61, PR 148 ms, QRS duration 80 ms, QTc 457 EKG from ER showed atrial fibrillation heart rate in the 130s 10/19/2022 EKG 10/10/2021: sinus bradycardia 58 low voltage, QRS duration 86 ms on flecainide, prior sinus bradycardia 59 QRS duration 82 ms  Recent Labs: 10/19/2022: BUN 20; Creatinine, Ser 1.16; Hemoglobin 16.0; Platelets 225; Potassium 4.2; Sodium 140  Recent Lipid Panel    Component Value Date/Time   CHOL (H) 01/24/2011 1714    232        ATP III CLASSIFICATION:  <200     mg/dL   Desirable  200-239  mg/dL   Borderline High  >=240    mg/dL   High          TRIG 172 (H) 01/24/2011 1714   HDL 58 01/24/2011 1714   CHOLHDL 4.0 01/24/2011 1714   VLDL 34 01/24/2011 1714   LDLCALC (H) 01/24/2011 1714    140        Total Cholesterol/HDL:CHD Risk Coronary Heart Disease Risk Table                     Men   Women  1/2 Average Risk   3.4   3.3  Average Risk       5.0   4.4  2 X Average Risk   9.6   7.1  3 X Average Risk  23.4   11.0        Use the calculated Patient Ratio above and the CHD Risk Table to determine the patient's CHD Risk.        ATP III CLASSIFICATION (LDL):   <100     mg/dL   Optimal  100-129  mg/dL   Near or Above                    Optimal  130-159  mg/dL   Borderline  160-189  mg/dL   High  >190     mg/dL   Very High     Risk Assessment/Calculations:    CHA2DS2-VASc Score =     This indicates a  % annual risk of stroke. The patient's score is based upon:            Physical Exam:    VS:  BP 110/70 (BP Location: Left Arm, Patient Position: Sitting, Cuff Size: Normal)   Pulse 61   Ht '5\' 9"'$  (1.753 m)   Wt 166 lb (75.3 kg)   BMI 24.51 kg/m     Wt Readings from Last 3 Encounters:  10/25/22 166 lb (75.3 kg)  10/21/22 165 lb 12.8 oz (75.2 kg)  10/10/22 165 lb 3.2 oz (74.9 kg)     GEN:  Well nourished, well developed in no acute distress HEENT: Normal NECK: No JVD; No carotid bruits LYMPHATICS: No lymphadenopathy CARDIAC: Reg rhythm, no murmurs, rubs, or gallops RESPIRATORY:  Clear to auscultation without rales, wheezing or rhonchi  ABDOMEN: Soft, non-tender, non-distended MUSCULOSKELETAL:  No edema; No deformity  SKIN: Warm and dry NEUROLOGIC:  Alert and oriented x 3 PSYCHIATRIC:  Normal affect   ASSESSMENT:    1. Paroxysmal atrial fibrillation (HCC)   2. Chronic anticoagulation   3. Coronary artery calcification       PLAN:    In order of problems listed above:  Paroxysmal atrial fibrillation (HCC) Today is back in normal rhythm, heart rate 61 bpm with PR  interval of 148 ms.  QRS duration is 80 ms.  He has been on amiodarone 400 mg twice a day since Tuesday evening, today is Friday morning. He no longer has chest discomfort.  He did have evidence of atrial fibrillation yesterday on 11/30 according to his Apple Watch.  I looked at tracings. What we will do is plan on keeping his 400 twice a day until Monday.  We will switch this to 200 twice a day after that.  If he does have another episode of atrial fibrillation, he can always take an additional amiodarone. - We will then see him back on Friday, December 15  which will give him adequate time on continued anticoagulation.  During that visit my plan is to schedule a heart catheterization.  Since he was having chest discomfort with his rapid A-fib he could have underlying stenosis.  He does have an elevated coronary calcium score. Discussed his case today personally with EP physician Dr. Myles Gip.    Coronary artery calcification High calcium score 900.  Multivessel CAD.   His wife was present.  Wife had a cardiac catheterization with Dr. [place].  See above for details.   Chronic anticoagulation Continue with Eliquis 5 mg twice a day.  Prior hemoglobin reassuring from outside labs.  No changes made.  Has not missed any doses.     Follow up: December 15 to discuss cardiac catheterization if maintaining sinus rhythm.  Thankfully, he has not required cardioversion.  Medication Adjustments/Labs and Tests Ordered: Current medicines are reviewed at length with the patient today.  Concerns regarding medicines are outlined above.  Orders Placed This Encounter  Procedures   EKG 12-Lead    No orders of the defined types were placed in this encounter.    Patient Instructions  Medication Instructions:  Continue current medications. On Monday decrease Amiodarone to 200 mg one tablet twice a day.   *If you need a refill on your cardiac medications before your next appointment, please call your pharmacy*   Follow-Up: At [facility], you and your health needs are our priority.  As part of our continuing mission to provide you with exceptional heart care, we have created designated Provider Care Teams.  These Care Teams include your primary Cardiologist (physician) and Advanced Practice Providers (APPs -  Physician Assistants and Nurse Practitioners) who all work together to provide you with the care you need, when you need it.  We recommend signing up for the patient portal called "MyChart".  Sign up information is provided on this After Visit  Summary.  MyChart is used to connect with patients for Virtual Visits (Telemedicine).  Patients are able to view lab/test results, encounter notes, upcoming appointments, etc.  Non-urgent messages can be sent to your provider as well.   To learn more about what you can do with MyChart, go to [email or web address].    Your next appointment:   As scheduled (12/15)  The format for your next appointment:   In Person  Provider:   Candee Furbish, MD      Important Information About Sugar         Signed, David Furbish, MD  10/25/2022 10:41 AM    [place]

## 2022-10-28 ENCOUNTER — Ambulatory Visit (HOSPITAL_COMMUNITY): Admit: 2022-10-28 | Payer: Medicare Other | Admitting: Internal Medicine

## 2022-10-28 ENCOUNTER — Encounter (HOSPITAL_COMMUNITY): Payer: Self-pay

## 2022-10-28 ENCOUNTER — Telehealth: Payer: Self-pay | Admitting: Cardiology

## 2022-10-28 SURGERY — LEFT HEART CATH AND CORONARY ANGIOGRAPHY
Anesthesia: LOCAL

## 2022-10-28 NOTE — Telephone Encounter (Signed)
Call sent straight to triage. Patient complaining of being back in A. FIB since Saturday night with HR  120's to 150's. Patient was wondering if he needed to double up on his amiodarone. Tried to review note from office visit on Friday to see what the care plan is for patient. Informed patient that a message would be sent to Dr. Marlou Porch and his nurse for advisement, and we would give him a call back as soon as possible.

## 2022-10-28 NOTE — Telephone Encounter (Signed)
Information reviewed with Dr Marlou Porch who gives verbal orders for pt to continue Amiodarone 400 mg BID x 2 days then decrease to 200 mg BID.  He may also take extra Metoprolol succinate 25 mg - 1/2 tablet this morning to help decrease his HR.  Pt states understanding and will call back on Wednesday if no improvement.

## 2022-10-28 NOTE — Telephone Encounter (Signed)
Patient c/o Palpitations:  High priority if patient c/o lightheadedness, shortness of breath, or chest pain  How long have you had palpitations/irregular HR/ Afib? Are you having the symptoms now?   Yes  Are you currently experiencing lightheadedness, SOB or CP? A little lightheadedness  Do you have a history of afib (atrial fibrillation) or irregular heart rhythm?   Yes  Have you checked your BP or HR? (document readings if available):   HR 126  Are you experiencing any other symptoms?   Tiredness, no energy   Patient stated he has been in Afib since Saturday night.  Patient stated medication was changed at Friday visit.

## 2022-10-30 NOTE — Telephone Encounter (Signed)
Patient states that he no longer in afib. patient want to know if he should go back to his normal dosage or continue what he is on. Please advise

## 2022-10-30 NOTE — Telephone Encounter (Signed)
Called patient with Dr. Marlou Porch advisement as written below. Patient verbalized understanding.  Continue to follow current plan Candee Furbish, MD

## 2022-11-08 ENCOUNTER — Encounter: Payer: Self-pay | Admitting: Cardiology

## 2022-11-08 ENCOUNTER — Ambulatory Visit: Payer: Medicare Other | Attending: Cardiology | Admitting: Cardiology

## 2022-11-08 VITALS — BP 112/70 | HR 50 | Ht 69.0 in | Wt 165.0 lb

## 2022-11-08 DIAGNOSIS — I2510 Atherosclerotic heart disease of native coronary artery without angina pectoris: Secondary | ICD-10-CM

## 2022-11-08 DIAGNOSIS — E7800 Pure hypercholesterolemia, unspecified: Secondary | ICD-10-CM | POA: Diagnosis not present

## 2022-11-08 DIAGNOSIS — Z01812 Encounter for preprocedural laboratory examination: Secondary | ICD-10-CM

## 2022-11-08 DIAGNOSIS — I480 Paroxysmal atrial fibrillation: Secondary | ICD-10-CM | POA: Diagnosis not present

## 2022-11-08 DIAGNOSIS — I2584 Coronary atherosclerosis due to calcified coronary lesion: Secondary | ICD-10-CM | POA: Diagnosis not present

## 2022-11-08 DIAGNOSIS — Z7901 Long term (current) use of anticoagulants: Secondary | ICD-10-CM | POA: Diagnosis not present

## 2022-11-08 LAB — CBC

## 2022-11-08 NOTE — H&P (View-Only) (Signed)
Cardiology Office Note:    Date:  11/08/2022   ID:  David Wright, DOB Jun 29, 1946, MRN [id number]  PCP:  Seward Carol, MD   [facility] Providers Cardiologist:  Candee Furbish, MD Electrophysiologist:  Melida Quitter, MD     Referring MD: Seward Carol, MD    History of Present Illness:    David Wright is a 76 y.o. 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  He has had very minor breakthrough atrial fibrillation after his amiodarone loading as occurred.  He is currently on amiodarone 200 mg twice a day.  Overall doing well.  Previously had a very low PAF burden.  Approximately 3 -5 times over the past year, mostly at night. TV, feels it come on.  Symptomatic.   On Eliquis with minor bruising.  No significant bleeding.  Over the course of 2022 however he had more frequent episodes of atrial fibrillation lasting longer sometimes up to 8 hours.  He gave me a list of dates, July 26, August 13, September 4, 7, 8, 18, 20, 30, October 11.  Symptomatic with this.  2-3 years ago had a bizzarre episode where he was having trouble with memory, transient. Got better.  Dr. Delfina Redwood is his neighbor.  Has not had any issues since.  Has had an SVT ablation that may or may not of been successful in the past.  Remembers her being treated at the [facility].  His wife, [name], is present for our visit. Today, he states that he has had 4 episodes of symptomatic episode of A-fib in the last 3 days. His episodes seem to be random in nature with no clear triggers. Currently he feels slight chest discomfort with deep inspiration. He attributes this to his A-fib. He states that he immediately knows when he has returned to A-fib.  Was previously on Flecainide '50mg'$  BID for over 10 years since his first episode of A-fib.   He has  been very compliant with his Eliquis and has not missed any doses.    Past Medical History:  Diagnosis Date   Anginal pain ([place])    has been worked-up but all neg.   Arthritis    Asthma    well controlled   Atrial tachycardia, paroxysmal    Complication of anesthesia 40 yrs.ago   spinal headache   Dysrhythmia    A-Fibrillation   Headache    Hypercholesteremia    pt reports that his cholesterol is normal   PAC (premature atrial contraction)    Paroxysmal atrial fibrillation (HCC)    Snores    Spinal headache    40 years ago    Past Surgical History:  Procedure Laterality Date   COLONOSCOPY WITH PROPOFOL N/A 09/23/2016   Procedure: COLONOSCOPY WITH PROPOFOL;  Surgeon: Garlan Fair, MD;  Location: [facility];  Service: Endoscopy;  Laterality: N/A;   EYE SURGERY     age 31   INGUINAL HERNIA REPAIR Right 12/04/2016   Procedure: LAPAROSCOPIC RIGHT INGUINAL HERNIA WITH MESH;  Surgeon: Coralie Keens, MD;  Location: [place];  Service: General;  Laterality: Right;   INSERTION OF MESH Right 12/04/2016   Procedure: INSERTION OF MESH;  Surgeon: Coralie Keens, MD;  Location: [place];  Service: General;  Laterality: Right;   JOINT REPLACEMENT     perianal fistula     had spinal anesthesia and had spinal  headache after surgery, 40 years ago   [place]  10/27/2012   RIGHT HIP    Current Medications: Current Meds  Medication Sig   alclomethasone (ACLOVATE) 0.05 % cream as needed.   amiodarone (PACERONE) 200 MG tablet Take 2 tablets (400 mg total) by mouth 2 (two) times daily. Starting on Tuesday evening.   ELIQUIS 5 MG TABS tablet TAKE 1 TABLET TWICE A DAY   fluticasone (FLONASE) 50 MCG/ACT nasal spray Place 2 sprays into the nose daily.   Fluticasone-Salmeterol (ADVAIR) 100-50 MCG/DOSE AEPB Inhale 1 puff into the lungs daily.   levalbuterol (XOPENEX HFA) 45 MCG/ACT inhaler as needed.    metoprolol succinate (TOPROL-XL) 25 MG 24 hr tablet TAKE  ONE-HALF (1/2) TABLET AT BEDTIME   rosuvastatin (CRESTOR) 10 MG tablet Take 1 tablet (10 mg total) by mouth daily.   sildenafil (VIAGRA) 100 MG tablet Take 1 tablet (100 mg total) by mouth daily as needed for erectile dysfunction.     Allergies:   No known allergies   Social History   Socioeconomic History   Marital status: Married    Spouse name: Not on file   Number of children: Not on file   Years of education: Not on file   Highest education level: Not on file  Occupational History   Not on file  Tobacco Use   Smoking status: Never   Smokeless tobacco: Never  Vaping Use   Vaping Use: Never used  Substance and Sexual Activity   Alcohol use: Yes    Alcohol/week: 0.0 standard drinks of alcohol    Comment: occasionally a drink per week or less   Drug use: No   Sexual activity: Not on file  Other Topics Concern   Not on file  Social History Narrative   Pt lives in [place] with spouse.   Retired Nature conservation officer for [employer] Strain: Not on [employer] Insecurity: Not on file  Transportation Needs: Not on file  Physical Activity: Not on file  Stress: Not on file  Social Connections: Not on file     Family History: The patient's family history includes Heart attack in his father and mother; Heart disease in his father and mother; Pancreatic cancer in his sister.  ROS:   Please see the history of present illness.     EKGs/Labs/Other Studies Reviewed:    The following studies were reviewed today:  Calcium scoring 09/20/22: FINDINGS: Coronary Calcium Score: Left main: 13 Left anterior descending artery: 472 Left circumflex artery: 155 Right coronary artery: 273 Total: 914 Percentile: 72nd Pericardium: Normal. Non-cardiac: See separate report from [facility]. IMPRESSION: 1. Coronary calcium score of 914. This was 72nd percentile for age-, race-, and sex-matched  controls.  Echocardiogram 2022:  1. Left ventricular ejection fraction, by estimation, is 55 to 60%. The  left ventricle has normal function. The left ventricle has no regional  wall motion abnormalities. Left ventricular diastolic parameters are  consistent with Grade I diastolic  dysfunction (impaired relaxation).   2. Right ventricular systolic function is normal. The right ventricular  size is mildly enlarged. Tricuspid regurgitation signal is inadequate for  assessing PA pressure.   3. Borderline bileaflet mitral valve prolapse. The mitral valve is  grossly normal. Trivial mitral valve regurgitation. No evidence of mitral  stenosis.   4. The aortic valve is grossly normal. There is mild calcification of the  aortic valve.  Aortic valve regurgitation is trivial. No aortic stenosis is  present.   5. Pulmonic valve regurgitation is moderate.   6. The inferior vena cava is normal in size with greater than 50%  respiratory variability, suggesting right atrial pressure of 3 mmHg   EKG:  EKG 10/25/2022 shows sinus rhythm 61, PR 148 ms, QRS duration 80 ms, QTc 457 EKG from ER showed atrial fibrillation heart rate in the 130s 10/19/2022 EKG 10/10/2021: sinus bradycardia 58 low voltage, QRS duration 86 ms on flecainide, prior sinus bradycardia 59 QRS duration 82 ms  Recent Labs: 10/19/2022: BUN 20; Creatinine, Ser 1.16; Hemoglobin 16.0; Platelets 225; Potassium 4.2; Sodium 140  Recent Lipid Panel    Component Value Date/Time   CHOL (H) 01/24/2011 1714    232        ATP III CLASSIFICATION:  <200     mg/dL   Desirable  200-239  mg/dL   Borderline High  >=240    mg/dL   High          TRIG 172 (H) 01/24/2011 1714   HDL 58 01/24/2011 1714   CHOLHDL 4.0 01/24/2011 1714   VLDL 34 01/24/2011 1714   LDLCALC (H) 01/24/2011 1714    140        Total Cholesterol/HDL:CHD Risk Coronary Heart Disease Risk Table                     Men   Women  1/2 Average Risk   3.4   3.3  Average Risk        5.0   4.4  2 X Average Risk   9.6   7.1  3 X Average Risk  23.4   11.0        Use the calculated Patient Ratio above and the CHD Risk Table to determine the patient's CHD Risk.        ATP III CLASSIFICATION (LDL):  <100     mg/dL   Optimal  100-129  mg/dL   Near or Above                    Optimal  130-159  mg/dL   Borderline  160-189  mg/dL   High  >190     mg/dL   Very High     Risk Assessment/Calculations:    CHA2DS2-VASc Score =     This indicates a  % annual risk of stroke. The patient's score is based upon:            Physical Exam:    VS:  BP 112/70 (BP Location: Left Arm, Patient Position: Sitting, Cuff Size: Normal)   Pulse (!) 50   Ht '5\' 9"'$  (1.753 m)   Wt 165 lb (74.8 kg)   BMI 24.37 kg/m     Wt Readings from Last 3 Encounters:  11/08/22 165 lb (74.8 kg)  10/25/22 166 lb (75.3 kg)  10/21/22 165 lb 12.8 oz (75.2 kg)     GEN:  Well nourished, well developed in no acute distress HEENT: Normal NECK: No JVD; No carotid bruits LYMPHATICS: No lymphadenopathy CARDIAC: Reg rhythm, no murmurs, rubs, or gallops RESPIRATORY:  Clear to auscultation without rales, wheezing or rhonchi  ABDOMEN: Soft, non-tender, non-distended MUSCULOSKELETAL:  No edema; No deformity  SKIN: Warm and dry NEUROLOGIC:  Alert and oriented x 3 PSYCHIATRIC:  Normal affect   ASSESSMENT:    1. Paroxysmal atrial fibrillation (HCC)   2. Coronary artery calcification   3.  Chronic anticoagulation   4. Hypercholesteremia   5. Pre-procedure lab exam        PLAN:    In order of problems listed above:  Paroxysmal atrial fibrillation ([place]) Today is back in normal rhythm, heart rate currently 50 bpm, PR interval 172 QRS duration 84.  Prior visit heart rate 61 bpm with PR interval of 148 ms.  QRS duration is 80 ms.  He no longer has chest discomfort after stopping rapid ventricular response.  Discussed his case today personally with EP physician Dr. Myles Gip.    Coronary artery  calcification High calcium score 900.  Multivessel CAD.   His wife was present.  Wife had a cardiac catheterization with Dr. Martinique.  See above for details.   Chronic anticoagulation Continue with Eliquis 5 mg twice a day.  Prior hemoglobin reassuring from outside labs.  No changes made.  Has not missed any doses.   We will go ahead and set him up for cardiac catheterization.  Risks and benefits of been explained including stroke heart attack death renal impairment bleeding.  He is willing to proceed.  After heart catheterization results, we will contact Dr. Myles Gip to discuss scheduling atrial fibrillation ablation.  Obviously if severe multivessel coronary artery disease is present and he needs bypass surgery, we will go forward with maze procedure.   Follow up: 68mh  Medication Adjustments/Labs and Tests Ordered: Current medicines are reviewed at length with the patient today.  Concerns regarding medicines are outlined above.  Orders Placed This Encounter  Procedures   CBC   Basic metabolic panel   EKG 146-OEHO   No orders of the defined types were placed in this encounter.    Patient Instructions  Medication Instructions:  The current medical regimen is effective;  continue present plan and medications.  *If you need a refill on your cardiac medications before your next appointment, please call your pharmacy*  Lab Work: Please have blood work today (CBC,BMP)  If you have labs (blood work) drawn today and your tests are completely normal, you will receive your results only by: MyChart Message (if you have MyChart) OR A paper copy in the mail If you have any lab test that is abnormal or we need to change your treatment, we will call you to review the results.  Testing/Procedures:   [facility] [id number] [place] [id number]: [phone]: [phone] AFarrell Pantaleo 11/08/2022  You are scheduled for a Cardiac Catheterization on Thursday, December 21 with Dr. Peter JMartinique  1. [facility] [address] [place] [id number] 7:00 AM (two hours before your procedure to ensure your preparation). Free valet parking service is available.   Special note: Every effort is made to have your procedure done on time. Please understand that emergencies sometimes delay scheduled procedures.  2. Diet: Do not eat or drink anything after midnight prior to your procedure except sips of water to take medications.  3. Labs:  today  4. Medication instructions in preparation for your procedure:  Stop taking Eliquis (Apixiban) on Tuesday, December 19.   On the morning of your procedure, you may take other morning medications with sips of water.  5. Plan for one night stay--bring personal belongings. 6. Bring a current list  of your medications and current insurance cards. 7. You MUST have a responsible person to drive you home. 8. Someone MUST be with you the first 24 hours after you arrive home or your discharge will be delayed. 9. Please wear clothes that are easy to get on and off and wear slip-on shoes.  Thank you for allowing [place] to care for you!   -- [facility]  Follow-Up: At [facility], you and your health needs are our priority.  As part of our continuing mission to provide you with exceptional heart care, we have created designated Provider Care Teams.  These Care Teams include your primary Cardiologist (physician) and Advanced Practice Providers (APPs -  Physician Assistants and Nurse Practitioners) who all work together to provide you with the care you need, when you need it.  We recommend signing up for the patient portal called "MyChart".  Sign up information is provided on this  After Visit Summary.  MyChart is used to connect with patients for Virtual Visits (Telemedicine).  Patients are able to view lab/test results, encounter notes, upcoming appointments, etc.  Non-urgent messages can be sent to your provider as well.   To learn more about what you can do with MyChart, go to [email or web address].    Your next appointment:   1 month(s)  The format for your next appointment:   In Person  Provider:   Candee Furbish, MD      Important Information About Sugar         Signed, Candee Furbish, MD  11/08/2022 1:57 PM    [place]

## 2022-11-08 NOTE — Progress Notes (Signed)
Cardiology Office Note:    Date:  11/08/2022   ID:  David Wright, DOB 1946/08/18, MRN [id number]  PCP:  Seward Carol, MD   [facility] Providers Cardiologist:  Candee Furbish, MD Electrophysiologist:  Melida Quitter, MD     Referring MD: Seward Carol, MD    History of Present Illness:    David Wright is a 77 y.o. 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  He has had very minor breakthrough atrial fibrillation after his amiodarone loading as occurred.  He is currently on amiodarone 200 mg twice a day.  Overall doing well.  Previously had a very low PAF burden.  Approximately 3 -5 times over the past year, mostly at night. TV, feels it come on.  Symptomatic.   On Eliquis with minor bruising.  No significant bleeding.  Over the course of 2022 however he had more frequent episodes of atrial fibrillation lasting longer sometimes up to 8 hours.  He gave me a list of dates, July 26, August 13, September 4, 7, 8, 18, 20, 30, October 11.  Symptomatic with this.  2-3 years ago had a bizzarre episode where he was having trouble with memory, transient. Got better.  Dr. Delfina Redwood is his neighbor.  Has not had any issues since.  Has had an SVT ablation that may or may not of been successful in the past.  Remembers her being treated at the [facility] clinic.  His wife, [name], is present for our visit. Today, he states that he has had 4 episodes of symptomatic episode of A-fib in the last 3 days. His episodes seem to be random in nature with no clear triggers. Currently he feels slight chest discomfort with deep inspiration. He attributes this to his A-fib. He states that he immediately knows when he has returned to A-fib.  Was previously on Flecainide '50mg'$  BID for over 10 years since his first episode of A-fib.   He has  been very compliant with his Eliquis and has not missed any doses.    Past Medical History:  Diagnosis Date   Anginal pain ([place])    has been worked-up but all neg.   Arthritis    Asthma    well controlled   Atrial tachycardia, paroxysmal    Complication of anesthesia 40 yrs.ago   spinal headache   Dysrhythmia    A-Fibrillation   Headache    Hypercholesteremia    pt reports that his cholesterol is normal   PAC (premature atrial contraction)    Paroxysmal atrial fibrillation (HCC)    Snores    Spinal headache    40 years ago    Past Surgical History:  Procedure Laterality Date   COLONOSCOPY WITH PROPOFOL N/A 09/23/2016   Procedure: COLONOSCOPY WITH PROPOFOL;  Surgeon: Garlan Fair, MD;  Location: [facility];  Service: Endoscopy;  Laterality: N/A;   EYE SURGERY     age 63   INGUINAL HERNIA REPAIR Right 12/04/2016   Procedure: LAPAROSCOPIC RIGHT INGUINAL HERNIA WITH MESH;  Surgeon: Coralie Keens, MD;  Location: [place];  Service: General;  Laterality: Right;   INSERTION OF MESH Right 12/04/2016   Procedure: INSERTION OF MESH;  Surgeon: Coralie Keens, MD;  Location: [place];  Service: General;  Laterality: Right;   JOINT REPLACEMENT     perianal fistula     had spinal anesthesia and had spinal  headache after surgery, 40 years ago   [place]  10/27/2012   RIGHT HIP    Current Medications: Current Meds  Medication Sig   alclomethasone (ACLOVATE) 0.05 % cream as needed.   amiodarone (PACERONE) 200 MG tablet Take 2 tablets (400 mg total) by mouth 2 (two) times daily. Starting on Tuesday evening.   ELIQUIS 5 MG TABS tablet TAKE 1 TABLET TWICE A DAY   fluticasone (FLONASE) 50 MCG/ACT nasal spray Place 2 sprays into the nose daily.   Fluticasone-Salmeterol (ADVAIR) 100-50 MCG/DOSE AEPB Inhale 1 puff into the lungs daily.   levalbuterol (XOPENEX HFA) 45 MCG/ACT inhaler as needed.    metoprolol succinate (TOPROL-XL) 25 MG 24 hr tablet TAKE  ONE-HALF (1/2) TABLET AT BEDTIME   rosuvastatin (CRESTOR) 10 MG tablet Take 1 tablet (10 mg total) by mouth daily.   sildenafil (VIAGRA) 100 MG tablet Take 1 tablet (100 mg total) by mouth daily as needed for erectile dysfunction.     Allergies:   No known allergies   Social History   Socioeconomic History   Marital status: Married    Spouse name: Not on file   Number of children: Not on file   Years of education: Not on file   Highest education level: Not on file  Occupational History   Not on file  Tobacco Use   Smoking status: Never   Smokeless tobacco: Never  Vaping Use   Vaping Use: Never used  Substance and Sexual Activity   Alcohol use: Yes    Alcohol/week: 0.0 standard drinks of alcohol    Comment: occasionally a drink per week or less   Drug use: No   Sexual activity: Not on file  Other Topics Concern   Not on file  Social History Narrative   Pt lives in [place] with spouse.   Retired Nature conservation officer for [employer] Strain: Not on [employer] Insecurity: Not on file  Transportation Needs: Not on file  Physical Activity: Not on file  Stress: Not on file  Social Connections: Not on file     Family History: The patient's family history includes Heart attack in his father and mother; Heart disease in his father and mother; Pancreatic cancer in his sister.  ROS:   Please see the history of present illness.     EKGs/Labs/Other Studies Reviewed:    The following studies were reviewed today:  Calcium scoring 09/20/22: FINDINGS: Coronary Calcium Score: Left main: 13 Left anterior descending artery: 472 Left circumflex artery: 155 Right coronary artery: 273 Total: 914 Percentile: 72nd Pericardium: Normal. Non-cardiac: See separate report from [facility]. IMPRESSION: 1. Coronary calcium score of 914. This was 72nd percentile for age-, race-, and sex-matched  controls.  Echocardiogram 2022:  1. Left ventricular ejection fraction, by estimation, is 55 to 60%. The  left ventricle has normal function. The left ventricle has no regional  wall motion abnormalities. Left ventricular diastolic parameters are  consistent with Grade I diastolic  dysfunction (impaired relaxation).   2. Right ventricular systolic function is normal. The right ventricular  size is mildly enlarged. Tricuspid regurgitation signal is inadequate for  assessing PA pressure.   3. Borderline bileaflet mitral valve prolapse. The mitral valve is  grossly normal. Trivial mitral valve regurgitation. No evidence of mitral  stenosis.   4. The aortic valve is grossly normal. There is mild calcification of the  aortic valve.  Aortic valve regurgitation is trivial. No aortic stenosis is  present.   5. Pulmonic valve regurgitation is moderate.   6. The inferior vena cava is normal in size with greater than 50%  respiratory variability, suggesting right atrial pressure of 3 mmHg   EKG:  EKG 10/25/2022 shows sinus rhythm 61, PR 148 ms, QRS duration 80 ms, QTc 457 EKG from ER showed atrial fibrillation heart rate in the 130s 10/19/2022 EKG 10/10/2021: sinus bradycardia 58 low voltage, QRS duration 86 ms on flecainide, prior sinus bradycardia 59 QRS duration 82 ms  Recent Labs: 10/19/2022: BUN 20; Creatinine, Ser 1.16; Hemoglobin 16.0; Platelets 225; Potassium 4.2; Sodium 140  Recent Lipid Panel    Component Value Date/Time   CHOL (H) 01/24/2011 1714    232        ATP III CLASSIFICATION:  <200     mg/dL   Desirable  200-239  mg/dL   Borderline High  >=240    mg/dL   High          TRIG 172 (H) 01/24/2011 1714   HDL 58 01/24/2011 1714   CHOLHDL 4.0 01/24/2011 1714   VLDL 34 01/24/2011 1714   LDLCALC (H) 01/24/2011 1714    140        Total Cholesterol/HDL:CHD Risk Coronary Heart Disease Risk Table                     Men   Women  1/2 Average Risk   3.4   3.3  Average Risk        5.0   4.4  2 X Average Risk   9.6   7.1  3 X Average Risk  23.4   11.0        Use the calculated Patient Ratio above and the CHD Risk Table to determine the patient's CHD Risk.        ATP III CLASSIFICATION (LDL):  <100     mg/dL   Optimal  100-129  mg/dL   Near or Above                    Optimal  130-159  mg/dL   Borderline  160-189  mg/dL   High  >190     mg/dL   Very High     Risk Assessment/Calculations:    CHA2DS2-VASc Score =     This indicates a  % annual risk of stroke. The patient's score is based upon:            Physical Exam:    VS:  BP 112/70 (BP Location: Left Arm, Patient Position: Sitting, Cuff Size: Normal)   Pulse (!) 50   Ht '5\' 9"'$  (1.753 m)   Wt 165 lb (74.8 kg)   BMI 24.37 kg/m     Wt Readings from Last 3 Encounters:  11/08/22 165 lb (74.8 kg)  10/25/22 166 lb (75.3 kg)  10/21/22 165 lb 12.8 oz (75.2 kg)     GEN:  Well nourished, well developed in no acute distress HEENT: Normal NECK: No JVD; No carotid bruits LYMPHATICS: No lymphadenopathy CARDIAC: Reg rhythm, no murmurs, rubs, or gallops RESPIRATORY:  Clear to auscultation without rales, wheezing or rhonchi  ABDOMEN: Soft, non-tender, non-distended MUSCULOSKELETAL:  No edema; No deformity  SKIN: Warm and dry NEUROLOGIC:  Alert and oriented x 3 PSYCHIATRIC:  Normal affect   ASSESSMENT:    1. Paroxysmal atrial fibrillation (HCC)   2. Coronary artery calcification   3.  Chronic anticoagulation   4. Hypercholesteremia   5. Pre-procedure lab exam        PLAN:    In order of problems listed above:  Paroxysmal atrial fibrillation ([place]) Today is back in normal rhythm, heart rate currently 50 bpm, PR interval 172 QRS duration 84.  Prior visit heart rate 61 bpm with PR interval of 148 ms.  QRS duration is 80 ms.  He no longer has chest discomfort after stopping rapid ventricular response.  Discussed his case today personally with EP physician Dr. Myles Gip.    Coronary artery  calcification High calcium score 900.  Multivessel CAD.   His wife was present.  Wife had a cardiac catheterization with Dr. Martinique.  See above for details.   Chronic anticoagulation Continue with Eliquis 5 mg twice a day.  Prior hemoglobin reassuring from outside labs.  No changes made.  Has not missed any doses.   We will go ahead and set him up for cardiac catheterization.  Risks and benefits of been explained including stroke heart attack death renal impairment bleeding.  He is willing to proceed.  After heart catheterization results, we will contact Dr. Myles Gip to discuss scheduling atrial fibrillation ablation.  Obviously if severe multivessel coronary artery disease is present and he needs bypass surgery, we will go forward with maze procedure.   Follow up: 53mh  Medication Adjustments/Labs and Tests Ordered: Current medicines are reviewed at length with the patient today.  Concerns regarding medicines are outlined above.  Orders Placed This Encounter  Procedures   CBC   Basic metabolic panel   EKG 106-CBJS   No orders of the defined types were placed in this encounter.    Patient Instructions  Medication Instructions:  The current medical regimen is effective;  continue present plan and medications.  *If you need a refill on your cardiac medications before your next appointment, please call your pharmacy*  Lab Work: Please have blood work today (CBC,BMP)  If you have labs (blood work) drawn today and your tests are completely normal, you will receive your results only by: MyChart Message (if you have MyChart) OR A paper copy in the mail If you have any lab test that is abnormal or we need to change your treatment, we will call you to review the results.  Testing/Procedures:   [place] [place] [place] DEPT OF [facility] [id number] [id number]: [phone]: [phone] AKohle Winner 11/08/2022  You are scheduled for a Cardiac Catheterization on Thursday, December 21 with Dr. Peter JMartinique  1. [facility] [address] [place] [id number] 7:00 AM (two hours before your procedure to ensure your preparation). Free valet parking service is available.   Special note: Every effort is made to have your procedure done on time. Please understand that emergencies sometimes delay scheduled procedures.  2. Diet: Do not eat or drink anything after midnight prior to your procedure except sips of water to take medications.  3. Labs:  today  4. Medication instructions in preparation for your procedure:  Stop taking Eliquis (Apixiban) on Tuesday, December 19.   On the morning of your procedure, you may take other morning medications with sips of water.  5. Plan for one night stay--bring personal belongings. 6. Bring a current list  of your medications and current insurance cards. 7. You MUST have a responsible person to drive you home. 8. Someone MUST be with you the first 24 hours after you arrive home or your discharge will be delayed. 9. Please wear clothes that are easy to get on and off and wear slip-on shoes.  Thank you for allowing [place] to care for you!   -- [place] [facility] services  Follow-Up: At [facility], you and your health needs are our priority.  As part of our continuing mission to provide you with exceptional heart care, we have created designated Provider Care Teams.  These Care Teams include your primary Cardiologist (physician) and Advanced Practice Providers (APPs -  Physician Assistants and Nurse Practitioners) who all work together to provide you with the care you need, when you need it.  We recommend signing up for the patient portal called "MyChart".  Sign up information is provided on this  After Visit Summary.  MyChart is used to connect with patients for Virtual Visits (Telemedicine).  Patients are able to view lab/test results, encounter notes, upcoming appointments, etc.  Non-urgent messages can be sent to your provider as well.   To learn more about what you can do with MyChart, go to [email or web address].    Your next appointment:   1 month(s)  The format for your next appointment:   In Person  Provider:   Candee Furbish, MD      Important Information About Sugar         Signed, Candee Furbish, MD  11/08/2022 1:57 PM    [place]

## 2022-11-08 NOTE — Patient Instructions (Signed)
Medication Instructions:  The current medical regimen is effective;  continue present plan and medications.  *If you need a refill on your cardiac medications before your next appointment, please call your pharmacy*  Lab Work: Please have blood work today (CBC,BMP)  If you have labs (blood work) drawn today and your tests are completely normal, you will receive your results only by: MyChart Message (if you have MyChart) OR A paper copy in the mail If you have any lab test that is abnormal or we need to change your treatment, we will call you to review the results.  Testing/Procedures:   [place] A DEPT OF [place] A DEPT OF [name] [place], [place] [id number] [place] [address] Dept: [phone] Loc: [phone]  David Wright  11/08/2022  You are scheduled for a Cardiac Catheterization on Thursday, December 21 with Dr. Peter Martinique.  1. Please arrive at the [facility] (Main Entrance A) at [facility]: [place], [place] [address] at 7:00 AM (two hours before your procedure to ensure your preparation). Free valet parking service is available.   Special note: Every effort is made to have your procedure done on time. Please understand that emergencies sometimes delay scheduled procedures.  2. Diet: Do not eat or drink anything after midnight prior to your procedure except sips of water to take medications.  3. Labs:  today  4. Medication instructions in preparation for your procedure:  Stop taking Eliquis (Apixiban) on Tuesday, December 19.   On the morning of your procedure, you may take other morning medications with sips of water.  5. Plan for one night stay--bring personal belongings. 6. Bring a current list of your medications and current insurance cards. 7. You MUST have a responsible person to drive you home. 8. Someone MUST be with you  the first 24 hours after you arrive home or your discharge will be delayed. 9. Please wear clothes that are easy to get on and off and wear slip-on shoes.  Thank you for allowing [place] to care for you!   -- [place] In[facility] Cardiovascular services  Follow-Up: At [facility], you and your health needs are our priority.  As part of our continuing mission to provide you with exceptional heart care, we have created designated Provider Care Teams.  These Care Teams include your primary Cardiologist (physician) and Advanced Practice Providers (APPs -  Physician Assistants and Nurse Practitioners) who all work together to provide you with the care you need, when you need it.  We recommend signing up for the patient portal called "MyChart".  Sign up information is provided on this After Visit Summary.  MyChart is used to connect with patients for Virtual Visits (Telemedicine).  Patients are able to view lab/test results, encounter notes, upcoming appointments, etc.  Non-urgent messages can be sent to your provider as well.   To learn more about what you can do with MyChart, go to [email or web address].    Your next appointment:   1 month(s)  The format for your next appointment:   In Person  Provider:   Candee Furbish, MD      Important Information About Sugar

## 2022-11-09 LAB — CBC
Hematocrit: 44.5 % (ref 37.5–51.0)
Hemoglobin: 15.4 g/dL (ref 13.0–17.7)
MCH: 32.2 pg (ref 26.6–33.0)
MCHC: 34.6 g/dL (ref 31.5–35.7)
MCV: 93 fL (ref 79–97)
Platelets: 233 10*3/uL (ref 150–450)
RBC: 4.79 x10E6/uL (ref 4.14–5.80)
RDW: 12.6 % (ref 11.6–15.4)
WBC: 7.4 10*3/uL (ref 3.4–10.8)

## 2022-11-09 LAB — BASIC METABOLIC PANEL
BUN/Creatinine Ratio: 13 (ref 10–24)
BUN: 15 mg/dL (ref 8–27)
CO2: 22 mmol/L (ref 20–29)
Calcium: 9.4 mg/dL (ref 8.6–10.2)
Chloride: 104 mmol/L (ref 96–106)
Creatinine, Ser: 1.17 mg/dL (ref 0.76–1.27)
Glucose: 89 mg/dL (ref 70–99)
Potassium: 4.6 mmol/L (ref 3.5–5.2)
Sodium: 143 mmol/L (ref 134–144)
eGFR: 64 mL/min/{1.73_m2} (ref >59)

## 2022-11-11 ENCOUNTER — Telehealth: Payer: Self-pay | Admitting: Cardiology

## 2022-11-11 NOTE — Telephone Encounter (Signed)
Patient called and mentioned that he has decided to keep his Cath Lab appointment on 11/14/22

## 2022-11-11 NOTE — Telephone Encounter (Signed)
Patient wants to reschedule cath procedure on Thursday (12/21).

## 2022-11-11 NOTE — Telephone Encounter (Signed)
Follow Up:     Patient's Cath is on Thursdday.

## 2022-11-11 NOTE — Telephone Encounter (Signed)
Follow Up:      Patient said he was returning Cheryl's calll. He thinks it is about his Cath on Friday.

## 2022-11-11 NOTE — Telephone Encounter (Signed)
Spoke to patient advised I did not call him this morning.Stated he was returning a call to someone form Dr.Jordan's office.After reviewing chart I told him his pre cath lab results.I will send message to Dr.Skain's RN.

## 2022-11-11 NOTE — Telephone Encounter (Addendum)
LMTCB. Cath lab stated patient needs to be there by 7AM. BMET and CBC completed.

## 2022-11-11 NOTE — Telephone Encounter (Signed)
Pt called back regarding message received in Triage.    Pt stated he wants to keep the date of Thursday, 11/14/2022 for his cath procedure.  I told patient that no changes have been made to his procedure date, and that I was calling him to find out why he wanted to reschedule.    Pt wants to keep date as stated above, no follow up required.

## 2022-11-12 ENCOUNTER — Telehealth: Payer: Self-pay | Admitting: *Deleted

## 2022-11-12 NOTE — Telephone Encounter (Signed)
Cardiac Catheterization scheduled at [facility] for: Thursday November 14, 2022 9 AM Arrival time and place: [facility] Main Entrance A at: 7 AM  Nothing to eat after midnight prior to procedure, clear liquids until 5 AM day of procedure.  Medication instructions: -Hold:   Eliquis-none 11/12/22 until post procedure -Except hold medications usual morning medications can be taken with sips of water including aspirin 81 mg.  Confirmed patient has responsible adult to drive home post procedure and be with patient first 24 hours after arriving home.  Patient reports no new symptoms concerning for COVID-19 in the past 10 days.  Reviewed procedure instructions with patient.

## 2022-11-14 ENCOUNTER — Ambulatory Visit (HOSPITAL_COMMUNITY)
Admission: RE | Admit: 2022-11-14 | Discharge: 2022-11-14 | Disposition: A | Discharge status: Home / Self Care | Payer: Medicare Other | Attending: Cardiology | Admitting: Cardiology

## 2022-11-14 ENCOUNTER — Encounter (HOSPITAL_COMMUNITY)
Admission: RE | Disposition: A | Discharge status: Home / Self Care | Payer: Self-pay | Source: Home / Self Care | Attending: Cardiology

## 2022-11-14 ENCOUNTER — Other Ambulatory Visit: Payer: Self-pay

## 2022-11-14 DIAGNOSIS — I25118 Atherosclerotic heart disease of native coronary artery with other forms of angina pectoris: Secondary | ICD-10-CM | POA: Insufficient documentation

## 2022-11-14 DIAGNOSIS — I480 Paroxysmal atrial fibrillation: Secondary | ICD-10-CM | POA: Insufficient documentation

## 2022-11-14 DIAGNOSIS — Z7901 Long term (current) use of anticoagulants: Secondary | ICD-10-CM

## 2022-11-14 DIAGNOSIS — Z01812 Encounter for preprocedural laboratory examination: Secondary | ICD-10-CM

## 2022-11-14 DIAGNOSIS — I2584 Coronary atherosclerosis due to calcified coronary lesion: Secondary | ICD-10-CM | POA: Diagnosis not present

## 2022-11-14 DIAGNOSIS — I2510 Atherosclerotic heart disease of native coronary artery without angina pectoris: Secondary | ICD-10-CM

## 2022-11-14 DIAGNOSIS — Z8249 Family history of ischemic heart disease and other diseases of the circulatory system: Secondary | ICD-10-CM | POA: Insufficient documentation

## 2022-11-14 DIAGNOSIS — E7800 Pure hypercholesterolemia, unspecified: Secondary | ICD-10-CM

## 2022-11-14 HISTORY — PX: LEFT HEART CATH AND CORONARY ANGIOGRAPHY: CATH118249

## 2022-11-14 SURGERY — LEFT HEART CATH AND CORONARY ANGIOGRAPHY
Anesthesia: LOCAL

## 2022-11-14 MED ORDER — SODIUM CHLORIDE 0.9% FLUSH: 3.0000 mL | INTRAVENOUS | Status: DC | PRN

## 2022-11-14 MED ORDER — IOHEXOL 350 MG/ML SOLN
INTRAVENOUS | Status: DC | PRN
  Administered 2022-11-14: 60 mL

## 2022-11-14 MED ORDER — FENTANYL CITRATE (PF) 100 MCG/2ML IJ SOLN
INTRAMUSCULAR | Status: DC | PRN
  Administered 2022-11-14 (×2): 25 ug via INTRAVENOUS

## 2022-11-14 MED ORDER — SODIUM CHLORIDE 0.9 % WEIGHT BASED INFUSION: 3.0000 mL/kg/h | INTRAVENOUS | Status: AC

## 2022-11-14 MED ORDER — SODIUM CHLORIDE 0.9% FLUSH: 3.0000 mL | Freq: Two times a day (BID) | INTRAVENOUS | Status: DC

## 2022-11-14 MED ORDER — HEPARIN (PORCINE) IN NACL 1000-0.9 UT/500ML-% IV SOLN
INTRAVENOUS | Status: AC
  Filled 2022-11-14: qty 500

## 2022-11-14 MED ORDER — ACETAMINOPHEN 325 MG PO TABS: 650.0000 mg | ORAL_TABLET | ORAL | Status: DC | PRN

## 2022-11-14 MED ORDER — ASPIRIN 81 MG PO CHEW: 81.0000 mg | CHEWABLE_TABLET | ORAL | Status: DC

## 2022-11-14 MED ORDER — FENTANYL CITRATE (PF) 100 MCG/2ML IJ SOLN
INTRAMUSCULAR | Status: AC
  Filled 2022-11-14: qty 2

## 2022-11-14 MED ORDER — SODIUM CHLORIDE 0.9 % IV SOLN: 250.0000 mL | INTRAVENOUS | Status: DC | PRN

## 2022-11-14 MED ORDER — LIDOCAINE HCL (PF) 1 % IJ SOLN
INTRAMUSCULAR | Status: DC | PRN
  Administered 2022-11-14 (×2): 2 mL

## 2022-11-14 MED ORDER — MIDAZOLAM HCL 2 MG/2ML IJ SOLN
INTRAMUSCULAR | Status: AC
  Filled 2022-11-14: qty 2

## 2022-11-14 MED ORDER — LIDOCAINE HCL (PF) 1 % IJ SOLN
INTRAMUSCULAR | Status: AC
  Filled 2022-11-14: qty 30

## 2022-11-14 MED ORDER — HEPARIN (PORCINE) IN NACL 1000-0.9 UT/500ML-% IV SOLN
INTRAVENOUS | Status: DC | PRN
  Administered 2022-11-14 (×2): 500 mL

## 2022-11-14 MED ORDER — ONDANSETRON HCL 4 MG/2ML IJ SOLN: 4.0000 mg | Freq: Four times a day (QID) | INTRAMUSCULAR | Status: DC | PRN

## 2022-11-14 MED ORDER — SODIUM CHLORIDE 0.9 % WEIGHT BASED INFUSION: 1.0000 mL/kg/h | INTRAVENOUS | Status: DC

## 2022-11-14 MED ORDER — VERAPAMIL HCL 2.5 MG/ML IV SOLN
INTRAVENOUS | Status: AC
  Filled 2022-11-14: qty 2

## 2022-11-14 MED ORDER — VERAPAMIL HCL 2.5 MG/ML IV SOLN
INTRAVENOUS | Status: DC | PRN
  Administered 2022-11-14: 10 mL via INTRA_ARTERIAL

## 2022-11-14 MED ORDER — HEPARIN SODIUM (PORCINE) 1000 UNIT/ML IJ SOLN
INTRAMUSCULAR | Status: DC | PRN
  Administered 2022-11-14: 3500 [IU] via INTRAVENOUS

## 2022-11-14 MED ORDER — HEPARIN SODIUM (PORCINE) 1000 UNIT/ML IJ SOLN
INTRAMUSCULAR | Status: AC
  Filled 2022-11-14: qty 10

## 2022-11-14 MED ORDER — MIDAZOLAM HCL 2 MG/2ML IJ SOLN
INTRAMUSCULAR | Status: DC | PRN
  Administered 2022-11-14 (×2): 1 mg via INTRAVENOUS

## 2022-11-14 SURGICAL SUPPLY — 10 items
CATH 5FR JL3.5 JR4 ANG PIG MP (CATHETERS) IMPLANT
DEVICE RAD COMP TR BAND LRG (VASCULAR PRODUCTS) IMPLANT
GLIDESHEATH SLEND SS 6F .021 (SHEATH) IMPLANT
GUIDEWIRE INQWIRE 1.5J.035X260 (WIRE) IMPLANT
INQWIRE 1.5J .035X260CM (WIRE) ×1
KIT HEART LEFT (KITS) ×1 IMPLANT
PACK CARDIAC CATHETERIZATION (CUSTOM PROCEDURE TRAY) ×1 IMPLANT
SHEATH PROBE COVER 6X72 (BAG) IMPLANT
TRANSDUCER W/STOPCOCK (MISCELLANEOUS) ×1 IMPLANT
TUBING CIL FLEX 10 FLL-RA (TUBING) ×1 IMPLANT

## 2022-11-14 NOTE — Progress Notes (Signed)
Pt and spouse received all d/c instructions written and verbal. All questions and concerns addressed. PIVs removed and intact. Procedure site intact, no concerns at this time. Pt  denies any acute pain or discomfort. Remains in stable condition.

## 2022-11-14 NOTE — Interval H&P Note (Signed)
History and Physical Interval Note:  11/14/2022 8:52 AM  Jani Gravel  has presented today for surgery, with the diagnosis of Abnormal [place] CT and chest pain..  The various methods of treatment have been discussed with the patient and family. After consideration of risks, benefits and other options for treatment, the patient has consented to  Procedure(s): LEFT HEART CATH AND CORONARY ANGIOGRAPHY (N/A) as a surgical intervention.  The patient's history has been reviewed, patient examined, no change in status, stable for surgery.  I have reviewed the patient's chart and labs.  Questions were answered to the patient's satisfaction.   Cath Lab Visit (complete for each Cath Lab visit)  Clinical Evaluation Leading to the Procedure:   ACS: No.  Non-ACS:    Anginal Classification: CCS I  Anti-ischemic medical therapy: Minimal Therapy (1 class of medications)  Non-Invasive Test Results: No non-invasive testing performed  Prior CABG: No previous CABG        Collier [facility] 11/14/2022 8:52 AM

## 2022-11-14 NOTE — Progress Notes (Signed)
TR band removed and intact, new dressing along w/ arm brace in place. No s/s of bleeding or hematoma noted. Will continue to monitor.

## 2022-11-15 ENCOUNTER — Encounter (HOSPITAL_COMMUNITY): Payer: Self-pay | Admitting: Cardiology

## 2022-11-18 ENCOUNTER — Other Ambulatory Visit: Payer: Self-pay | Admitting: Cardiology

## 2022-11-19 ENCOUNTER — Other Ambulatory Visit: Payer: Self-pay

## 2022-11-19 MED ORDER — AMIODARONE HCL 200 MG PO TABS: 400.0000 mg | ORAL_TABLET | Freq: Two times a day (BID) | ORAL | 11 refills | Status: DC

## 2022-11-21 ENCOUNTER — Other Ambulatory Visit: Payer: Self-pay

## 2022-11-21 ENCOUNTER — Telehealth: Payer: Self-pay | Admitting: Cardiology

## 2022-11-21 DIAGNOSIS — I480 Paroxysmal atrial fibrillation: Secondary | ICD-10-CM

## 2022-11-21 NOTE — Telephone Encounter (Signed)
Spoke with pt who is having complaints of pain, discomfort, soreness, bruising at cath site.  Pt reports MD had to try 2 different vessels because the "1st one didn't work."  Pt reports soreness/bruising that goes up to his elbow from his hand.  He denies any signs of bleeding, redness or drainage at the cath site, no signs of infection.  Pt does feel the bruising is resolving and not extending.  Advised this is not atypical due to the tissue trauma during a cardiac cath especially with him having to be stuck twice and the fact he is on an anti-coagulant (Eliquis).  Advised to continue to watch the site for any s/s of infection, use warms compresses to the area, continue to elevated and he may use Tylenol for the discomfort.  Advised to call back if any changes in the site or his pain level.  Aware of how to contact provider on call during the weekend or over night if necessary. Will forward to Dr Marlou Porch for his knowledge and any further recommendations/orders.

## 2022-11-21 NOTE — Progress Notes (Signed)
Preprocedure testing

## 2022-11-21 NOTE — Telephone Encounter (Signed)
Pt states he had a Heart Cath done last week. Since procedure he has been having pain, bruising, discoloration and sensitive feelings. Pt would like a callback regarding this matter

## 2022-11-21 NOTE — Addendum Note (Signed)
Addended by: Bernestine Amass on: 11/21/2022 02:47 PM   Modules accepted: Orders

## 2022-11-22 ENCOUNTER — Telehealth: Payer: Self-pay | Admitting: Cardiology

## 2022-11-22 ENCOUNTER — Telehealth: Payer: Self-pay | Admitting: Cardiovascular Disease

## 2022-11-22 NOTE — Telephone Encounter (Signed)
Pt c/o medication issue:  1. Name of Medication:   amiodarone (PACERONE) 200 MG tablet   2. How are you currently taking this medication (dosage and times per day)?     3. Are you having a reaction (difficulty breathing--STAT)?   4. What is your medication issue?   Caller stated that patient filled this prescription at a local pharmacy at this dosage.  Caller stated they want to verify if this medication dosage should be reduced as the patient has been on this medication for 30 days.  Ref# [id number].

## 2022-11-22 NOTE — Telephone Encounter (Signed)
Pt states he talked with someone yesterday and was supposed to have a MyChart message regarding his upcoming ablation. Pt states he never received a message. Will route message to scheduling nurse.

## 2022-11-22 NOTE — Telephone Encounter (Signed)
Patient would like a call back to discuss instructions prior to his upcoming procedure.

## 2022-11-22 NOTE — Telephone Encounter (Signed)
Dose clarified that he is taking Amiodarone 200 mg twice daily. Pharmacist verbalized understanding and had no additional questions.

## 2022-11-28 DIAGNOSIS — H5213 Myopia, bilateral: Secondary | ICD-10-CM | POA: Diagnosis not present

## 2022-11-28 DIAGNOSIS — H2513 Age-related nuclear cataract, bilateral: Secondary | ICD-10-CM | POA: Diagnosis not present

## 2022-12-09 ENCOUNTER — Ambulatory Visit: Payer: Medicare Other | Attending: Cardiovascular Disease | Admitting: Cardiovascular Disease

## 2022-12-09 ENCOUNTER — Encounter: Payer: Self-pay | Admitting: Cardiovascular Disease

## 2022-12-09 VITALS — BP 108/60 | HR 53 | Ht 69.0 in | Wt 166.0 lb

## 2022-12-09 DIAGNOSIS — I480 Paroxysmal atrial fibrillation: Secondary | ICD-10-CM | POA: Diagnosis not present

## 2022-12-09 NOTE — Patient Instructions (Signed)
Medication Instructions:  Your physician recommends that you continue on your current medications as directed. Please refer to the Current Medication list given to you today.  *If you need a refill on your cardiac medications before your next appointment, please call your pharmacy*   Lab Work: None ordered If you have labs (blood work) drawn today and your tests are completely normal, you will receive your results only by: [place] (if you have MyChart) OR A paper copy in the mail If you have any lab test that is abnormal or we need to change your treatment, we will call you to review the results.   Testing/Procedures: None ordered   Follow-Up: 4 weeks after ablation with A-fib clinic

## 2022-12-09 NOTE — Progress Notes (Signed)
Electrophysiology Office Note:    Date:  12/09/2022   ID:  David Wright, DOB August 23, 1946, MRN [id number]  PCP:  Seward Carol, MD   [place] [place] [facility] Cardiologist:  Candee Furbish, MD Electrophysiologist:  Melida Quitter, MD     Referring MD: Seward Carol, MD   Chief complaint: PAF  History of Present Illness:    David Wright is a 77 y.o. male with a hx of HLD, AF referred for arrhythmia management.  He has symptomatic paroxysmal AF. His burden has historically been very low, a few episodes a year. He has been managed with flecainide, metoprolol, and apixaban.   He has been having a few more episodes than usual recently. He will often take an extra tablet of flecainide. The episodes may last a few minutes or hours. He knows immediately when he has AF and becomes more fatigued and short of breath.  Since our last visit, he has been doing quite well. He had a coronary angiogram that did not show any clinically significant obstruction.  Because he had recurrence on flecainide, it was discontinued.  Amiodarone was started, and he has been doing very well on it without any symptoms of AF recurrence.   He denies any recent chest pain. He has not had syncope, pre-syncope.    Past Medical History:  Diagnosis Date   Anginal pain ([place])    has been worked-up but all neg.   Arthritis    Asthma    well controlled   Atrial tachycardia, paroxysmal    Complication of anesthesia 40 yrs.ago   spinal headache   Dysrhythmia    A-Fibrillation   Headache    Hypercholesteremia    pt reports that his cholesterol is normal   PAC (premature atrial contraction)    Paroxysmal atrial fibrillation (HCC)    Snores    Spinal headache    40 years ago    Past Surgical History:  Procedure Laterality Date   COLONOSCOPY WITH PROPOFOL N/A 09/23/2016   Procedure: COLONOSCOPY WITH PROPOFOL;  Surgeon: Garlan Fair, MD;  [facility];  Service: Endoscopy;  Laterality: N/A;    EYE SURGERY     age 54   INGUINAL HERNIA REPAIR Right 12/04/2016   Procedure: LAPAROSCOPIC RIGHT INGUINAL HERNIA WITH MESH;  Surgeon: Coralie Keens, MD;  Location: [facility];  Service: General;  Laterality: Right;   INSERTION OF MESH Right 12/04/2016   Procedure: INSERTION OF MESH;  Surgeon: Coralie Keens, MD;  Location: [place];  Service: General;  Laterality: Right;   JOINT REPLACEMENT     LEFT HEART CATH AND CORONARY ANGIOGRAPHY N/A 11/14/2022   Procedure: LEFT HEART CATH AND CORONARY ANGIOGRAPHY;  Surgeon: Martinique, Peter M, MD;  Location: [place] [facility];  Service: Cardiovascular;  Laterality: N/A;   perianal fistula     had spinal anesthesia and had spinal headache after surgery, 40 years ago   [place]  10/27/2012   RIGHT HIP    Current Medications: Current Meds  Medication Sig   amiodarone (PACERONE) 200 MG tablet Take 2 tablets (400 mg total) by mouth 2 (two) times daily. Starting on Tuesday evening.   ELIQUIS 5 MG TABS tablet TAKE 1 TABLET TWICE A DAY   fluticasone (FLONASE) 50 MCG/ACT nasal spray Place 2 sprays into the nose every evening.   Fluticasone-Salmeterol (ADVAIR) 100-50 MCG/DOSE AEPB Inhale 1 puff into the lungs every evening.   levalbuterol (XOPENEX HFA) 45 MCG/ACT inhaler 1-2 puffs  every 6 (six) hours as needed for shortness of breath or wheezing.   metoprolol succinate (TOPROL-XL) 25 MG 24 hr tablet TAKE ONE-HALF (1/2) TABLET AT BEDTIME   rosuvastatin (CRESTOR) 10 MG tablet Take 1 tablet (10 mg total) by mouth daily.     Allergies:   No known allergies   Social History   Socioeconomic History   Marital status: Married    Spouse name: Not on file   Number of children: Not on file   Years of education: Not on file   Highest education level: Not on file  Occupational History   Not on file  Tobacco Use   Smoking status: Never   Smokeless tobacco: Never  Vaping Use   Vaping Use: Never used  Substance and Sexual Activity    Alcohol use: Yes    Alcohol/week: 0.0 standard drinks of alcohol    Comment: occasionally a drink per week or less   Drug use: No   Sexual activity: Not on file  Other Topics Concern   Not on file  Social History Narrative   Pt lives in [place] with spouse.   Retired Nature conservation officer for large [place] Strain: Not on [employer] Insecurity: Not on file  Transportation Needs: Not on file  Physical Activity: Not on file  Stress: Not on file  Social Connections: Not on file     Family History: The patient's family history includes Heart attack in his father and mother; Heart disease in his father and mother; Pancreatic cancer in his sister.  ROS:   Please see the history of present illness.    All other systems reviewed and are negative.  EKGs/Labs/Other Studies Reviewed Today:     CT cardiac calcium score: 09/23/2022 914, 72 %ile. (Started rosuvastatin)  EKG:  Last EKG results: today - sinus rhythm   Recent Labs: 11/08/2022: BUN 15; Creatinine, Ser 1.17; Hemoglobin 15.4; Platelets 233; Potassium 4.6; Sodium 143     Physical Exam:    VS:  BP 108/60   Pulse (!) 53   Ht '5\' 9"'$  (1.753 m)   Wt 166 lb (75.3 kg)   SpO2 97%   BMI 24.51 kg/m     Wt Readings from Last 3 Encounters:  12/09/22 166 lb (75.3 kg)  11/14/22 162 lb (73.5 kg)  11/08/22 165 lb (74.8 kg)     GEN: Well nourished, well developed in no acute distress CARDIAC: RRR, no murmurs, rubs, gallops RESPIRATORY:  Normal work of breathing MUSCULOSKELETAL: no edema    ASSESSMENT & PLAN:    Paroxysmal atrial fibrillation: symptomatic. Failed flecainide . Since our last visit, he has decided to proceed with an AF ablation. AF ablation has been scheduled. We discussed the procedure in detail today. We discussed the indication, rationale, logistics, anticipated benefits, and potential risks of the ablation procedure including but not  limited to -- bleed at the groin access site, chest pain, damage to nearby organs such as the diaphragm, lungs, or esophagus, need for a drainage tube, or prolonged hospitalization. I explained that the risk for stroke, heart attack, need for open chest surgery, or even death is very low but not zero. he  expressed understanding and wishes to proceed. 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 On Eliquis High risk medication: flecainide. ECG ok today renal function ok this month         Medication Adjustments/Labs and Tests Ordered: Current medicines are  reviewed at length with the patient today.  Concerns regarding medicines are outlined above.  No orders of the defined types were placed in this encounter.  No orders of the defined types were placed in this encounter.    Signed, Melida Quitter, MD  12/09/2022 2:57 PM    [place]

## 2022-12-11 ENCOUNTER — Other Ambulatory Visit: Payer: Medicare Other

## 2022-12-16 ENCOUNTER — Ambulatory Visit: Payer: Medicare Other | Attending: Cardiology | Admitting: Cardiology

## 2022-12-16 ENCOUNTER — Ambulatory Visit: Payer: Medicare Other

## 2022-12-16 ENCOUNTER — Encounter: Payer: Self-pay | Admitting: Cardiology

## 2022-12-16 VITALS — BP 106/70 | HR 62 | Ht 69.0 in | Wt 167.0 lb

## 2022-12-16 DIAGNOSIS — E7800 Pure hypercholesterolemia, unspecified: Secondary | ICD-10-CM | POA: Diagnosis not present

## 2022-12-16 DIAGNOSIS — Z79899 Other long term (current) drug therapy: Secondary | ICD-10-CM | POA: Diagnosis not present

## 2022-12-16 DIAGNOSIS — I480 Paroxysmal atrial fibrillation: Secondary | ICD-10-CM | POA: Diagnosis not present

## 2022-12-16 MED ORDER — AMIODARONE HCL 200 MG PO TABS: 200.0000 mg | ORAL_TABLET | Freq: Every day | ORAL | 3 refills | Status: DC

## 2022-12-16 NOTE — Patient Instructions (Signed)
Medication Instructions:  Please decrease Amiodarone to 200 mg once daily. Continue all other medications as listed.  *If you need a refill on your cardiac medications before your next appointment, please call your pharmacy*  Lab Work: Please have blood work as ordered and to include CBC, TSH, Free T4 and CMP.  If you have labs (blood work) drawn today and your tests are completely normal, you will receive your results only by: [place] (if you have MyChart) OR A paper copy in the mail If you have any lab test that is abnormal or we need to change your treatment, we will call you to review the results.   Follow-Up: At [facility], you and your health needs are our priority.  As part of our continuing mission to provide you with exceptional heart care, we have created designated Provider Care Teams.  These Care Teams include your primary Cardiologist (physician) and Advanced Practice Providers (APPs -  Physician Assistants and Nurse Practitioners) who all work together to provide you with the care you need, when you need it.  We recommend signing up for the patient portal called "MyChart".  Sign up information is provided on this After Visit Summary.  MyChart is used to connect with patients for Virtual Visits (Telemedicine).  Patients are able to view lab/test results, encounter notes, upcoming appointments, etc.  Non-urgent messages can be sent to your provider as well.   To learn more about what you can do with MyChart, go to [email or web address].    Your next appointment:   6 month(s)  Provider:   Candee Furbish, MD

## 2022-12-16 NOTE — Progress Notes (Signed)
Cardiology Office Note:    Date:  12/16/2022   ID:  David Wright, DOB June 02, 1946, MRN [id number]  PCP:  Seward Carol, MD   [facility] Prov[facility] Cardiologist:  Candee Furbish, MD Electrophysiologist:  Melida Quitter, MD     Referring MD: Seward Carol, MD    History of Present Illness:    David Wright is a 77 y.o. male follow-up of paroxysmal atrial fibrillation, failed flecainide, now on amiodarone, well-controlled, awaiting atrial fibrillation ablation in March 2024, Dr. Myles Gip with coronary calcium score of 900 multivessel disease, thankfully cardiac catheterization showing moderate stenosis here for follow-up.    Had chest pain with rapid atrial fibrillation  Previously had a very low PAF burden.  Approximately 3 -5 times over the past year, mostly at night. TV, feels it come on.  Symptomatic.   On Eliquis with minor bruising.  No significant bleeding.  Over the course of 2022 however he had more frequent episodes of atrial fibrillation lasting longer sometimes up to 8 hours.  He gave me a list of dates, July 26, August 13, September 4, 7, 8, 18, 20, 30, October 11.  Symptomatic with this.  2-3 years ago had a bizzarre episode where he was having trouble with memory, transient. Got better.  Dr. Delfina Redwood is his neighbor.  Has not had any issues since.  His wife, [name]  He has been very compliant with his Eliquis and has not missed any doses.    Past Medical History:  Diagnosis Date   Anginal pain ([place])    has been worked-up but all neg.   Arthritis    Asthma    well controlled   Atrial tachycardia, paroxysmal    Complication of anesthesia 40 yrs.ago   spinal headache   Dysrhythmia    A-Fibrillation   Headache    Hypercholesteremia    pt reports that his cholesterol is normal   PAC (premature atrial contraction)    Paroxysmal atrial fibrillation (HCC)    Snores    Spinal headache    40 years ago    Past Surgical History:  Procedure Laterality Date    COLONOSCOPY WITH PROPOFOL N/A 09/23/2016   Procedure: COLONOSCOPY WITH PROPOFOL;  Surgeon: Garlan Fair, MD;  [facility];  Service: Endoscopy;  Laterality: N/A;   EYE SURGERY     age 61   INGUINAL HERNIA REPAIR Right 12/04/2016   Procedure: LAPAROSCOPIC RIGHT INGUINAL HERNIA WITH MESH;  Surgeon: Coralie Keens, MD;  Location: [place];  Service: General;  Laterality: Right;   INSERTION OF MESH Right 12/04/2016   Procedure: INSERTION OF MESH;  Surgeon: Coralie Keens, MD;  Location: [place];  Service: General;  Laterality: Right;   JOINT REPLACEMENT     LEFT HEART CATH AND CORONARY ANGIOGRAPHY N/A 11/14/2022   Procedure: LEFT HEART CATH AND CORONARY ANGIOGRAPHY;  Surgeon: Martinique, Peter M, MD;  Location: [place] [facility];  Service: Cardiovascular;  Laterality: N/A;   perianal fistula     had spinal anesthesia and had spinal headache after surgery, 40 years ago   [place]  10/27/2012   RIGHT HIP    Current Medications: Current Meds  Medication Sig   ELIQUIS 5 MG TABS tablet TAKE 1 TABLET TWICE A DAY   fluticasone (FLONASE) 50 MCG/ACT nasal spray Place 2 sprays into the nose every evening.   Fluticasone-Salmeterol (ADVAIR) 100-50 MCG/DOSE AEPB Inhale 1 puff into the lungs every evening.   levalbuterol [facility] HFA)  45 MCG/ACT inhaler 1-2 puffs every 6 (six) hours as needed for shortness of breath or wheezing.   metoprolol succinate (TOPROL-XL) 25 MG 24 hr tablet TAKE ONE-HALF (1/2) TABLET AT BEDTIME   rosuvastatin (CRESTOR) 10 MG tablet Take 1 tablet (10 mg total) by mouth daily.   [DISCONTINUED] amiodarone (PACERONE) 200 MG tablet Take 2 tablets (400 mg total) by mouth 2 (two) times daily. Starting on Tuesday evening.     Allergies:   No known allergies   Social History   Socioeconomic History   Marital status: Married    Spouse name: Not on file   Number of children: Not on file   Years of education: Not on file   Highest education  level: Not on file  Occupational History   Not on file  Tobacco Use   Smoking status: Never   Smokeless tobacco: Never  Vaping Use   Vaping Use: Never used  Substance and Sexual Activity   Alcohol use: Yes    Alcohol/week: 0.0 standard drinks of alcohol    Comment: occasionally a drink per week or less   Drug use: No   Sexual activity: Not on file  Other Topics Concern   Not on file  Social History Narrative   Pt lives in [place] with spouse.   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  ROS:   Please see the history of present illness.     EKGs/Labs/Other Studies Reviewed:    The following studies were reviewed today:  Calcium scoring 09/20/22: FINDINGS: Coronary Calcium Score: Left main: 13 Left anterior descending artery: 472 Left circumflex artery: 155 Right coronary artery: 273 Total: 914 Percentile: 72nd Pericardium: Normal. Non-cardiac: See separate report from [facility]. IMPRESSION: 1. Coronary calcium score of 914. This was 72nd percentile for age-, race-, and sex-matched controls.  Cath 11/14/22:    Colon [place] LM lesion is 45% stenosed.   Prox LAD lesion is 30% stenosed.   Ost Cx to Mid Cx lesion is 20% stenosed.   Mid RCA lesion is 50% stenosed.   RPAV lesion is 50% stenosed.   The left ventricular systolic function is normal.   LV end diastolic pressure is normal.   The left ventricular ejection fraction is 55-65% by visual estimate.   Nonobstructive CAD Normal LV function Normal LVEDP   Plan: risk factor  modification  Diagnostic Dominance: Right    Echocardiogram 2022:  1. Left ventricular ejection fraction, by estimation, is 55 to 60%. The  left ventricle has normal function. The left ventricle has no regional  wall motion abnormalities. Left ventricular diastolic parameters are  consistent with Grade I diastolic  dysfunction (impaired relaxation).   2. Right ventricular systolic function is normal. The right ventricular  size is mildly enlarged. Tricuspid regurgitation signal is inadequate for  assessing PA pressure.   3. Borderline bileaflet mitral valve prolapse. The mitral valve is  grossly normal. Trivial mitral valve regurgitation. No evidence of mitral  stenosis.   4. The aortic valve is grossly normal. There is mild calcification of the  aortic valve. Aortic  valve regurgitation is trivial. No aortic stenosis is  present.   5. Pulmonic valve regurgitation is moderate.   6. The inferior vena cava is normal in size with greater than 50%  respiratory variability, suggesting right atrial pressure of 3 mmHg   EKG:  EKG 10/25/2022 shows sinus rhythm 61, PR 148 ms, QRS duration 80 ms, QTc 457 EKG from ER showed atrial fibrillation heart rate in the 130s 10/19/2022 EKG 10/10/2021: sinus bradycardia 58 low voltage, QRS duration 86 ms on flecainide, prior sinus bradycardia 59 QRS duration 82 ms  Recent Labs: 11/08/2022: BUN 15; Creatinine, Ser 1.17; Hemoglobin 15.4; Platelets 233; Potassium 4.6; Sodium 143  Recent Lipid Panel    Component Value Date/Time   CHOL (H) 01/24/2011 1714    232        ATP III CLASSIFICATION:  <200     mg/dL   Desirable  200-239  mg/dL   Borderline High  >=240    mg/dL   High          TRIG 172 (H) 01/24/2011 1714   HDL 58 01/24/2011 1714   CHOLHDL 4.0 01/24/2011 1714   VLDL 34 01/24/2011 1714   LDLCALC (H) 01/24/2011 1714    140        Total Cholesterol/HDL:CHD Risk Coronary Heart Disease Risk Table                     Men   Women  1/2  Average Risk   3.4   3.3  Average Risk       5.0   4.4  2 X Average Risk   9.6   7.1  3 X Average Risk  23.4   11.0        Use the calculated Patient Ratio above and the CHD Risk Table to determine the patient's CHD Risk.        ATP III CLASSIFICATION (LDL):  <100     mg/dL   Optimal  100-129  mg/dL   Near or Above                    Optimal  130-159  mg/dL   Borderline  160-189  mg/dL   High  >190     mg/dL   Very High     Risk Assessment/Calculations:    CHA2DS2-VASc Score =     This indicates a  % annual risk of stroke. The patient's score is based upon:            Physical Exam:    VS:  BP 106/70   Pulse 62   Ht '5\' 9"'$  (1.753 m)   Wt 167 lb (75.8 kg)   SpO2 97%   BMI 24.66 kg/m     Wt Readings from Last 3 Encounters:  12/16/22 167 lb (75.8 kg)  12/09/22 166 lb (75.3 kg)  11/14/22 162 lb (73.5 kg)     GEN: Well nourished, well developed, in no acute distress HEENT: normal Neck: no JVD, carotid bruits, or masses Cardiac: You need to figure out a way to get the [place] with Leah when to get that passport ready just in case we had to do that RRR; no murmurs, rubs, or gallops,no edema  Respiratory:  clear to auscultation bilaterally, normal work of breathing GI: soft, nontender, nondistended, + BS MS: no deformity or atrophy Skin: warm and dry, no rash Neuro:  Alert and Oriented x 3, Strength and sensation are intact Psych: euthymic mood,  full affect   ASSESSMENT:    1. Medication management   2. Paroxysmal atrial fibrillation (HCC)   3. Hypercholesteremia         PLAN:    In order of problems listed above:  Paroxysmal atrial fibrillation (HCC)  Maintaining sinus rhythm.  We will cut back his amiodarone to 200 mg once a day.  He has been taking twice daily.  Heart rate currently 50 bpm, PR interval 172 QRS duration 84.  Prior visit heart rate 61 bpm with PR interval of 148 ms.  QRS duration is 80 ms.  He no longer has chest discomfort after  stopping rapid ventricular response.  Discussed his case today personally with EP physician Dr. Myles Gip.  02/12/23 ablation scheduled.  Coronary artery calcification High calcium score 900.  Multivessel CAD.  Thankfully cardiac catheterization was performed and did not show any flow-limiting disease.  Moderate 50% lesions.  Appreciate Dr. Doug Sou assistance.  He did state that he had some un noted me comfortable ecchymosis that finally resolved post catheterization right radial.   Chronic anticoagulation Continue with Eliquis 5 mg twice a day.  Checking lab work.   Hyperlipidemia On Crestor 10 mg a day.  LDL goal will be less than 70.  Checking labs today.  Follow up: 6 mth  Medication Adjustments/Labs and Tests Ordered: Current medicines are reviewed at length with the patient today.  Concerns regarding medicines are outlined above.  Orders Placed This Encounter  Procedures   T4, free   TSH   CBC   Comprehensive metabolic panel    Meds ordered this encounter  Medications   amiodarone (PACERONE) 200 MG tablet    Sig: Take 1 tablet (200 mg total) by mouth daily.    Dispense:  90 tablet    Refill:  3     Patient Instructions  Medication Instructions:  Please decrease Amiodarone to 200 mg once daily. Continue all other medications as listed.  *If you need a refill on your cardiac medications before your next appointment, please call your pharmacy*  Lab Work: Please have blood work as ordered and to include CBC, TSH, Free T4 and CMP.  If you have labs (blood work) drawn today and your tests are completely normal, you will receive your results only by: [place] (if you have MyChart) OR A paper copy in the mail If you have any lab test that is abnormal or we need to change your treatment, we will call you to review the results.   Follow-Up: At [facility], you and your health needs are our priority.  As part of our continuing mission to provide you with  exceptional heart care, we have created designated Provider Care Teams.  These Care Teams include your primary Cardiologist (physician) and Advanced Practice Providers (APPs -  Physician Assistants and Nurse Practitioners) who all work together to provide you with the care you need, when you need it.  We recommend signing up for the patient portal called "MyChart".  Sign up information is provided on this After Visit Summary.  MyChart is used to connect with patients for Virtual Visits (Telemedicine).  Patients are able to view lab/test results, encounter notes, upcoming appointments, etc.  Non-urgent messages can be sent to your provider as well.   To learn more about what you can do with MyChart, go to [email or web address].    Your next appointment:   6 month(s)  Provider:   Candee Furbish, MD  Signed, Candee Furbish, MD  12/16/2022 12:25 PM    [place]

## 2022-12-17 LAB — LIPID PANEL
Chol/HDL Ratio: 2.9 ratio (ref 0.0–5.0)
Cholesterol, Total: 181 mg/dL (ref 100–199)
HDL: 62 mg/dL (ref >39)
LDL Chol Calc (NIH): 92 mg/dL (ref 0–99)
Triglycerides: 156 mg/dL — ABNORMAL HIGH (ref 0–149)
VLDL Cholesterol Cal: 27 mg/dL (ref 5–40)

## 2022-12-17 LAB — COMPREHENSIVE METABOLIC PANEL
ALT: 30 IU/L (ref 0–44)
AST: 22 IU/L (ref 0–40)
Albumin/Globulin Ratio: 2.1 (ref 1.2–2.2)
Albumin: 4.7 g/dL (ref 3.8–4.8)
Alkaline Phosphatase: 68 IU/L (ref 44–121)
BUN/Creatinine Ratio: 12 (ref 10–24)
BUN: 15 mg/dL (ref 8–27)
Bilirubin Total: 0.5 mg/dL (ref 0.0–1.2)
CO2: 22 mmol/L (ref 20–29)
Calcium: 9.6 mg/dL (ref 8.6–10.2)
Chloride: 104 mmol/L (ref 96–106)
Creatinine, Ser: 1.21 mg/dL (ref 0.76–1.27)
Globulin, Total: 2.2 g/dL (ref 1.5–4.5)
Glucose: 98 mg/dL (ref 70–99)
Potassium: 4.6 mmol/L (ref 3.5–5.2)
Sodium: 142 mmol/L (ref 134–144)
Total Protein: 6.9 g/dL (ref 6.0–8.5)
eGFR: 61 mL/min/{1.73_m2} (ref >59)

## 2022-12-17 LAB — CBC
Hematocrit: 46.1 % (ref 37.5–51.0)
Hemoglobin: 15.9 g/dL (ref 13.0–17.7)
MCH: 32.2 pg (ref 26.6–33.0)
MCHC: 34.5 g/dL (ref 31.5–35.7)
MCV: 93 fL (ref 79–97)
Platelets: 218 10*3/uL (ref 150–450)
RBC: 4.94 x10E6/uL (ref 4.14–5.80)
RDW: 12.9 % (ref 11.6–15.4)
WBC: 7.3 10*3/uL (ref 3.4–10.8)

## 2022-12-17 LAB — T4, FREE: Free T4: 1.64 ng/dL (ref 0.82–1.77)

## 2022-12-17 LAB — TSH: TSH: 1.57 u[IU]/mL (ref 0.450–4.500)

## 2022-12-18 ENCOUNTER — Telehealth: Payer: Self-pay | Admitting: Cardiology

## 2022-12-18 DIAGNOSIS — Z79899 Other long term (current) drug therapy: Secondary | ICD-10-CM

## 2022-12-18 DIAGNOSIS — E7800 Pure hypercholesterolemia, unspecified: Secondary | ICD-10-CM

## 2022-12-18 MED ORDER — ROSUVASTATIN CALCIUM 20 MG PO TABS: 20.0000 mg | ORAL_TABLET | Freq: Every day | ORAL | 3 refills | Status: DC

## 2022-12-18 NOTE — Telephone Encounter (Signed)
Pt is aware of results and agreeable to increase Crestor to 20 mg.  He recently filled his RX for 10 mg and reports he has enough to take 2 for now.  He is coming in for other lab work 3/1 and would like to have lipid checked at that time.  Orders placed.

## 2022-12-18 NOTE — Telephone Encounter (Signed)
Pt is returning call and is requesting call back in regards to results.

## 2023-01-21 ENCOUNTER — Other Ambulatory Visit: Payer: Self-pay | Admitting: *Deleted

## 2023-01-21 MED ORDER — ROSUVASTATIN CALCIUM 20 MG PO TABS: 20.0000 mg | ORAL_TABLET | Freq: Every day | ORAL | 3 refills | Status: DC

## 2023-01-24 ENCOUNTER — Ambulatory Visit: Payer: Medicare Other | Attending: Cardiology

## 2023-01-24 DIAGNOSIS — E7800 Pure hypercholesterolemia, unspecified: Secondary | ICD-10-CM | POA: Diagnosis not present

## 2023-01-24 DIAGNOSIS — I480 Paroxysmal atrial fibrillation: Secondary | ICD-10-CM

## 2023-01-24 DIAGNOSIS — Z79899 Other long term (current) drug therapy: Secondary | ICD-10-CM

## 2023-01-25 LAB — LIPID PANEL
Chol/HDL Ratio: 2.6 ratio (ref 0.0–5.0)
Cholesterol, Total: 135 mg/dL (ref 100–199)
HDL: 52 mg/dL (ref >39)
LDL Chol Calc (NIH): 51 mg/dL (ref 0–99)
Triglycerides: 200 mg/dL — ABNORMAL HIGH (ref 0–149)
VLDL Cholesterol Cal: 32 mg/dL (ref 5–40)

## 2023-01-25 LAB — CBC WITH DIFFERENTIAL/PLATELET
Basophils Absolute: 0.1 10*3/uL (ref 0.0–0.2)
Basos: 1 %
EOS (ABSOLUTE): 0.1 10*3/uL (ref 0.0–0.4)
Eos: 2 %
Hematocrit: 41.4 % (ref 37.5–51.0)
Hemoglobin: 14.2 g/dL (ref 13.0–17.7)
Immature Grans (Abs): 0 10*3/uL (ref 0.0–0.1)
Immature Granulocytes: 1 %
Lymphocytes Absolute: 1.1 10*3/uL (ref 0.7–3.1)
Lymphs: 17 %
MCH: 31.9 pg (ref 26.6–33.0)
MCHC: 34.3 g/dL (ref 31.5–35.7)
MCV: 93 fL (ref 79–97)
Monocytes Absolute: 0.7 10*3/uL (ref 0.1–0.9)
Monocytes: 10 %
Neutrophils Absolute: 4.6 10*3/uL (ref 1.4–7.0)
Neutrophils: 69 %
Platelets: 196 10*3/uL (ref 150–450)
RBC: 4.45 x10E6/uL (ref 4.14–5.80)
RDW: 12.7 % (ref 11.6–15.4)
WBC: 6.6 10*3/uL (ref 3.4–10.8)

## 2023-01-25 LAB — BASIC METABOLIC PANEL
BUN/Creatinine Ratio: 18 (ref 10–24)
BUN: 18 mg/dL (ref 8–27)
CO2: 22 mmol/L (ref 20–29)
Calcium: 9 mg/dL (ref 8.6–10.2)
Chloride: 107 mmol/L — ABNORMAL HIGH (ref 96–106)
Creatinine, Ser: 1.02 mg/dL (ref 0.76–1.27)
Glucose: 101 mg/dL — ABNORMAL HIGH (ref 70–99)
Potassium: 4.1 mmol/L (ref 3.5–5.2)
Sodium: 144 mmol/L (ref 134–144)
eGFR: 75 mL/min/{1.73_m2} (ref >59)

## 2023-02-03 ENCOUNTER — Telehealth (HOSPITAL_COMMUNITY): Payer: Self-pay | Admitting: Emergency Medicine

## 2023-02-03 NOTE — Telephone Encounter (Signed)
Reaching out to patient to offer assistance regarding upcoming cardiac imaging study; pt verbalizes understanding of appt date/time, parking situation and where to check in, pre-test NPO status and medications ordered, and verified current allergies; name and call back number provided for further questions should they arise Marchia Bond RN Navigator Cardiac Imaging [facility] [phone] office [phone] cell   Arrival 915 [facility] Denies iv issues Aware contrast Daily meds per ususal

## 2023-02-04 ENCOUNTER — Other Ambulatory Visit: Payer: Self-pay

## 2023-02-04 DIAGNOSIS — L570 Actinic keratosis: Secondary | ICD-10-CM | POA: Diagnosis not present

## 2023-02-04 DIAGNOSIS — Z85828 Personal history of other malignant neoplasm of skin: Secondary | ICD-10-CM | POA: Diagnosis not present

## 2023-02-04 DIAGNOSIS — L738 Other specified follicular disorders: Secondary | ICD-10-CM | POA: Diagnosis not present

## 2023-02-04 DIAGNOSIS — L821 Other seborrheic keratosis: Secondary | ICD-10-CM | POA: Diagnosis not present

## 2023-02-04 MED ORDER — ROSUVASTATIN CALCIUM 20 MG PO TABS: 20.0000 mg | ORAL_TABLET | Freq: Every day | ORAL | 0 refills | Status: DC

## 2023-02-05 ENCOUNTER — Ambulatory Visit (HOSPITAL_BASED_OUTPATIENT_CLINIC_OR_DEPARTMENT_OTHER)
Admission: RE | Admit: 2023-02-05 | Discharge: 2023-02-05 | Disposition: A | Discharge status: Home / Self Care | Payer: Medicare Other | Source: Ambulatory Visit | Attending: Cardiovascular Disease | Admitting: Cardiovascular Disease

## 2023-02-05 DIAGNOSIS — I480 Paroxysmal atrial fibrillation: Secondary | ICD-10-CM | POA: Diagnosis not present

## 2023-02-05 MED ORDER — IOHEXOL 350 MG/ML SOLN
80.0000 mL | Freq: Once | INTRAVENOUS | Status: AC | PRN
  Administered 2023-02-05: 80 mL via INTRAVENOUS

## 2023-02-07 ENCOUNTER — Telehealth: Payer: Self-pay | Admitting: Cardiovascular Disease

## 2023-02-07 NOTE — Telephone Encounter (Signed)
Follow Up:    Patient would like to talk to Dr Karel Jarvis nurse about his test results and his upcoming Ablation.

## 2023-02-07 NOTE — Telephone Encounter (Signed)
Returned call to patient.  Reviewed lab results and CT results with patient, per Dr. Myles Gip all looks good to proceed with ablation.  Reviewed ablation instructions with patient. Patient verbalized understanding.  Patient and spouse expressed concerns regarding extracardiac findings pending. Patient would like to know as soon as possible what this report shows.  Will forward to [place] to request assistance in getting the radiology report on extracardiac findings from cardiac CT performed on 02/05/2023.

## 2023-02-11 NOTE — Pre-Procedure Instructions (Signed)
Instructed patient on the following items: Arrival time 0600 Nothing to eat or drink after midnight No meds AM of procedure Responsible person to drive you home and stay with you for 24 hrs  Have you missed any doses of anti-coagulant Eliquis- hasn't missed any doses, don't take dose in the morning.   

## 2023-02-12 ENCOUNTER — Ambulatory Visit (HOSPITAL_COMMUNITY)
Admission: RE | Admit: 2023-02-12 | Discharge: 2023-02-12 | Disposition: A | Discharge status: Home / Self Care | Payer: Medicare Other | Attending: Cardiovascular Disease | Admitting: Cardiovascular Disease

## 2023-02-12 ENCOUNTER — Ambulatory Visit (HOSPITAL_COMMUNITY): Payer: Medicare Other | Admitting: Registered Nurse

## 2023-02-12 ENCOUNTER — Ambulatory Visit (HOSPITAL_COMMUNITY)
Admission: RE | Disposition: A | Discharge status: Home / Self Care | Payer: Self-pay | Source: Home / Self Care | Attending: Cardiovascular Disease

## 2023-02-12 ENCOUNTER — Other Ambulatory Visit (HOSPITAL_COMMUNITY): Payer: Self-pay

## 2023-02-12 ENCOUNTER — Ambulatory Visit (HOSPITAL_BASED_OUTPATIENT_CLINIC_OR_DEPARTMENT_OTHER): Payer: Medicare Other | Admitting: Registered Nurse

## 2023-02-12 DIAGNOSIS — Z79899 Other long term (current) drug therapy: Secondary | ICD-10-CM | POA: Diagnosis not present

## 2023-02-12 DIAGNOSIS — E785 Hyperlipidemia, unspecified: Secondary | ICD-10-CM | POA: Insufficient documentation

## 2023-02-12 DIAGNOSIS — D6869 Other thrombophilia: Secondary | ICD-10-CM | POA: Diagnosis not present

## 2023-02-12 DIAGNOSIS — I2510 Atherosclerotic heart disease of native coronary artery without angina pectoris: Secondary | ICD-10-CM | POA: Diagnosis not present

## 2023-02-12 DIAGNOSIS — I4891 Unspecified atrial fibrillation: Secondary | ICD-10-CM

## 2023-02-12 DIAGNOSIS — I4819 Other persistent atrial fibrillation: Secondary | ICD-10-CM | POA: Insufficient documentation

## 2023-02-12 DIAGNOSIS — Z7901 Long term (current) use of anticoagulants: Secondary | ICD-10-CM | POA: Diagnosis not present

## 2023-02-12 HISTORY — PX: ATRIAL FIBRILLATION ABLATION: EP1191

## 2023-02-12 SURGERY — ATRIAL FIBRILLATION ABLATION
Anesthesia: General

## 2023-02-12 MED ORDER — DEXAMETHASONE SODIUM PHOSPHATE 10 MG/ML IJ SOLN
INTRAMUSCULAR | Status: DC | PRN
  Administered 2023-02-12: 5 mg via INTRAVENOUS

## 2023-02-12 MED ORDER — SODIUM CHLORIDE 0.9% FLUSH: 3.0000 mL | INTRAVENOUS | Status: DC | PRN

## 2023-02-12 MED ORDER — PHENYLEPHRINE HCL-NACL 20-0.9 MG/250ML-% IV SOLN
INTRAVENOUS | Status: DC | PRN
  Administered 2023-02-12: 40 ug/min via INTRAVENOUS

## 2023-02-12 MED ORDER — LIDOCAINE 2% (20 MG/ML) 5 ML SYRINGE
INTRAMUSCULAR | Status: DC | PRN
  Administered 2023-02-12: 60 mg via INTRAVENOUS

## 2023-02-12 MED ORDER — ROCURONIUM BROMIDE 10 MG/ML (PF) SYRINGE
PREFILLED_SYRINGE | INTRAVENOUS | Status: DC | PRN
  Administered 2023-02-12: 60 mg via INTRAVENOUS

## 2023-02-12 MED ORDER — HEPARIN SODIUM (PORCINE) 1000 UNIT/ML IJ SOLN
INTRAMUSCULAR | Status: DC | PRN
  Administered 2023-02-12 (×2): 2000 [IU] via INTRAVENOUS
  Administered 2023-02-12: 14000 [IU] via INTRAVENOUS

## 2023-02-12 MED ORDER — AMIODARONE HCL 200 MG PO TABS: 100.0000 mg | ORAL_TABLET | Freq: Every day | ORAL | Status: DC

## 2023-02-12 MED ORDER — HEPARIN SODIUM (PORCINE) 1000 UNIT/ML IJ SOLN
INTRAMUSCULAR | Status: DC | PRN
  Administered 2023-02-12: 1000 [IU] via INTRAVENOUS

## 2023-02-12 MED ORDER — DOBUTAMINE INFUSION FOR EP/ECHO/NUC (1000 MCG/ML)
INTRAVENOUS | Status: AC
  Filled 2023-02-12: qty 250

## 2023-02-12 MED ORDER — PROPOFOL 10 MG/ML IV BOLUS
INTRAVENOUS | Status: DC | PRN
  Administered 2023-02-12: 110 mg via INTRAVENOUS

## 2023-02-12 MED ORDER — SODIUM CHLORIDE 0.9 % IV SOLN: INTRAVENOUS | Status: DC

## 2023-02-12 MED ORDER — ACETAMINOPHEN 325 MG PO TABS: 650.0000 mg | ORAL_TABLET | ORAL | Status: DC | PRN

## 2023-02-12 MED ORDER — ONDANSETRON HCL 4 MG/2ML IJ SOLN
INTRAMUSCULAR | Status: DC | PRN
  Administered 2023-02-12: 4 mg via INTRAVENOUS

## 2023-02-12 MED ORDER — DOBUTAMINE INFUSION FOR EP/ECHO/NUC (1000 MCG/ML)
INTRAVENOUS | Status: DC | PRN
  Administered 2023-02-12: 20 ug/kg/min via INTRAVENOUS

## 2023-02-12 MED ORDER — HEPARIN (PORCINE) IN NACL 1000-0.9 UT/500ML-% IV SOLN
INTRAVENOUS | Status: DC | PRN
  Administered 2023-02-12 (×3): 500 mL

## 2023-02-12 MED ORDER — PANTOPRAZOLE SODIUM 40 MG PO TBEC
40.0000 mg | DELAYED_RELEASE_TABLET | Freq: Every day | ORAL | 0 refills | Status: DC
  Filled 2023-02-12: qty 30, 30d supply, fill #0

## 2023-02-12 MED ORDER — COLCHICINE 0.6 MG PO TABS
0.6000 mg | ORAL_TABLET | Freq: Two times a day (BID) | ORAL | 0 refills | Status: DC
  Filled 2023-02-12: qty 10, 5d supply, fill #0

## 2023-02-12 MED ORDER — ONDANSETRON HCL 4 MG/2ML IJ SOLN: 4.0000 mg | Freq: Four times a day (QID) | INTRAMUSCULAR | Status: DC | PRN

## 2023-02-12 MED ORDER — SODIUM CHLORIDE 0.9 % IV SOLN: 250.0000 mL | INTRAVENOUS | Status: DC | PRN

## 2023-02-12 MED ORDER — PROTAMINE SULFATE 10 MG/ML IV SOLN
INTRAVENOUS | Status: DC | PRN
  Administered 2023-02-12: 50 mg via INTRAVENOUS

## 2023-02-12 MED ORDER — FENTANYL CITRATE (PF) 100 MCG/2ML IJ SOLN
INTRAMUSCULAR | Status: DC | PRN
  Administered 2023-02-12: 50 ug via INTRAVENOUS
  Administered 2023-02-12: 25 ug via INTRAVENOUS

## 2023-02-12 MED ORDER — ARTIFICIAL TEARS OPHTHALMIC OINT
TOPICAL_OINTMENT | OPHTHALMIC | Status: DC | PRN
  Administered 2023-02-12: 1 via OPHTHALMIC

## 2023-02-12 MED ORDER — HEPARIN SODIUM (PORCINE) 1000 UNIT/ML IJ SOLN
INTRAMUSCULAR | Status: AC
  Filled 2023-02-12: qty 10

## 2023-02-12 MED ORDER — PHENYLEPHRINE 80 MCG/ML (10ML) SYRINGE FOR IV PUSH (FOR BLOOD PRESSURE SUPPORT)
PREFILLED_SYRINGE | INTRAVENOUS | Status: DC | PRN
  Administered 2023-02-12: 80 ug via INTRAVENOUS
  Administered 2023-02-12: 160 ug via INTRAVENOUS
  Administered 2023-02-12: 80 ug via INTRAVENOUS

## 2023-02-12 MED ORDER — SUGAMMADEX SODIUM 200 MG/2ML IV SOLN
INTRAVENOUS | Status: DC | PRN
  Administered 2023-02-12 (×2): 100 mg via INTRAVENOUS

## 2023-02-12 SURGICAL SUPPLY — 19 items
CATH 8FR REPROCESSED SOUNDSTAR (CATHETERS) ×1 IMPLANT
CATH 8FR SOUNDSTAR REPROCESSED (CATHETERS) IMPLANT
CATH ABLAT QDOT MICRO BI TC FJ (CATHETERS) IMPLANT
CATH OCTARAY 2.0 F 3-3-3-3-3 (CATHETERS) IMPLANT
CATH PIGTAIL STEERABLE D1 8.7 (WIRE) IMPLANT
CATH S-M CIRCA TEMP PROBE (CATHETERS) IMPLANT
CATH WEB BI DIR CSDF CRV REPRO (CATHETERS) IMPLANT
CLOSURE PERCLOSE PROSTYLE (VASCULAR PRODUCTS) IMPLANT
COVER SWIFTLINK CONNECTOR (BAG) ×1 IMPLANT
DEVICE CLOSURE MYNXGRIP 6/7F (Vascular Products) IMPLANT
PACK EP LATEX FREE (CUSTOM PROCEDURE TRAY) ×1
PACK EP LF (CUSTOM PROCEDURE TRAY) ×1 IMPLANT
PAD DEFIB RADIO PHYSIO CONN (PAD) ×1 IMPLANT
PATCH CARTO3 (PAD) IMPLANT
SHEATH CARTO VIZIGO MED CURVE (SHEATH) IMPLANT
SHEATH PINNACLE 8F 10CM (SHEATH) IMPLANT
SHEATH PINNACLE 9F 10CM (SHEATH) IMPLANT
SHEATH PROBE COVER 6X72 (BAG) IMPLANT
TUBING SMART ABLATE COOLFLOW (TUBING) IMPLANT

## 2023-02-12 NOTE — Progress Notes (Signed)
In d/w Dr. Myles Gip, plan to stop amiodarone after 1 mo post ablation.  Tommye Standard, PA-C

## 2023-02-12 NOTE — Anesthesia Procedure Notes (Signed)
Procedure Name: Intubation Date/Time: 02/12/2023 8:51 AM  Performed by: Trinna Post., CRNAPre-anesthesia Checklist: Patient identified, Emergency Drugs available, Suction available, Patient being monitored and Timeout performed Patient Re-evaluated:Patient Re-evaluated prior to induction Oxygen Delivery Method: Circle system utilized Preoxygenation: Pre-oxygenation with 100% oxygen Induction Type: IV induction Ventilation: Mask ventilation without difficulty Laryngoscope Size: Mac and 4 Grade View: Grade I Tube type: Oral Tube size: 7.5 mm Number of attempts: 1 Airway Equipment and Method: Stylet Placement Confirmation: ETT inserted through vocal cords under direct vision, positive ETCO2 and breath sounds checked- equal and bilateral Secured at: 23 cm Tube secured with: Tape Dental Injury: Teeth and Oropharynx as per pre-operative assessment

## 2023-02-12 NOTE — Discharge Instructions (Addendum)
Post procedure care instructions No driving for 4 days. No lifting over 5 lbs for 1 week. No vigorous or sexual activity for 1 week. You may return to work/your usual activities on 02/20/23. Keep procedure site clean & dry. If you notice increased pain, swelling, bleeding or pus, call/return!  You may shower after 24 hours, but no soaking in baths/hot tubs/pools for 1 week.   You have an appointment set up with the [facility].  Multiple studies have shown that being followed by a dedicated atrial fibrillation clinic in addition to the standard care you receive from your other physicians improves health. We believe that enrollment in the atrial fibrillation clinic will allow [place] to better care for you.   The phone number to the [facility] is [phone]. The clinic is staffed Monday through Friday from 8:30am to 5pm.  Directions: The clinic is located in the [facility] [place] the hospital at the MAIN ENTRANCE "A", use [address] to the 6th floor.  Registration desk to the right of elevators on 6th floor  If you have any trouble locating the clinic, please don't hesitate to call [phone].  Cardiac Ablation, Care After  This sheet gives you information about how to care for yourself after your procedure. Your health care provider may also give you more specific instructions. If you have problems or questions, contact your health care provider. What can I expect after the procedure? After the procedure, it is common to have: Bruising around your puncture site. Tenderness around your puncture site. Skipped heartbeats. If you had an atrial fibrillation ablation, you may have atrial fibrillation during the first several months after your procedure.  Tiredness (fatigue).  Follow these instructions at home: Puncture site care  Follow instructions from your health care provider about how to take care of your puncture site. Make sure  you: If present, leave stitches (sutures), skin glue, or adhesive strips in place. These skin closures may need to stay in place for up to 2 weeks. If adhesive strip edges start to loosen and curl up, you may trim the loose edges. Do not remove adhesive strips completely unless your health care provider tells you to do that. If a large square bandage is present, this may be removed 24 hours after surgery.  Check your puncture site every day for signs of infection. Check for: Redness, swelling, or pain. Fluid or blood. If your puncture site starts to bleed, lie down on your back, apply firm pressure to the area, and contact your health care provider. Warmth. Pus or a bad smell. A pea or small marble sized lump at the site is normal and can take up to three months to resolve.  Driving Do not drive for at least 4 days after your procedure or however long your health care provider recommends. (Do not resume driving if you have previously been instructed not to drive for other health reasons.) Do not drive or use heavy machinery while taking prescription pain medicine. Activity Avoid activities that take a lot of effort for at least 7 days after your procedure. Do not lift anything that is heavier than 5 lb (4.5 kg) for one week.  No sexual activity for 1 week.  Return to your normal activities as told by your health care provider. Ask your health care provider what activities are safe for you. General instructions Take over-the-counter and prescription medicines only as told by your health care provider. Do not use any  products that contain nicotine or tobacco, such as cigarettes and e-cigarettes. If you need help quitting, ask your health care provider. You may shower after 24 hours, but Do not take baths, swim, or use a hot tub for 1 week.  Do not drink alcohol for 24 hours after your procedure. Keep all follow-up visits as told by your health care provider. This is important. Contact a health  care provider if: You have redness, mild swelling, or pain around your puncture site. You have fluid or blood coming from your puncture site that stops after applying firm pressure to the area. Your puncture site feels warm to the touch. You have pus or a bad smell coming from your puncture site. You have a fever. You have chest pain or discomfort that spreads to your neck, jaw, or arm. You have chest pain that is worse with lying on your back or taking a deep breath. You are sweating a lot. You feel nauseous. You have a fast or irregular heartbeat. You have shortness of breath. You are dizzy or light-headed and feel the need to lie down. You have pain or numbness in the arm or leg closest to your puncture site. Get help right away if: Your puncture site suddenly swells. Your puncture site is bleeding and the bleeding does not stop after applying firm pressure to the area. These symptoms may represent a serious problem that is an emergency. Do not wait to see if the symptoms will go away. Get medical help right away. Call your local emergency services (911 in the [place] Do not drive yourself to the hospital. Summary After the procedure, it is normal to have bruising and tenderness at the puncture site in your groin, neck, or forearm. Check your puncture site every day for signs of infection. Get help right away if your puncture site is bleeding and the bleeding does not stop after applying firm pressure to the area. This is a medical emergency. This information is not intended to replace advice given to you by your health care provider. Make sure you discuss any questions you have with your health care provider. Cardiac Ablation, Care After  This sheet gives you information about how to care for yourself after your procedure. Your health care provider may also give you more specific instructions. If you have problems or questions, contact your health care provider. What can I expect after  the procedure? After the procedure, it is common to have: Bruising around your puncture site. Tenderness around your puncture site. Skipped heartbeats. If you had an atrial fibrillation ablation, you may have atrial fibrillation during the first several months after your procedure.  Tiredness (fatigue).  Follow these instructions at home: Puncture site care  Follow instructions from your health care provider about how to take care of your puncture site. Make sure you: If present, leave stitches (sutures), skin glue, or adhesive strips in place. These skin closures may need to stay in place for up to 2 weeks. If adhesive strip edges start to loosen and curl up, you may trim the loose edges. Do not remove adhesive strips completely unless your health care provider tells you to do that. If a large square bandage is present, this may be removed 24 hours after surgery.  Check your puncture site every day for signs of infection. Check for: Redness, swelling, or pain. Fluid or blood. If your puncture site starts to bleed, lie down on your back, apply firm pressure to the area, and contact your  health care provider. Warmth. Pus or a bad smell. A pea or small marble sized lump at the site is normal and can take up to three months to resolve.  Driving Do not drive for at least 4 days after your procedure or however long your health care provider recommends. (Do not resume driving if you have previously been instructed not to drive for other health reasons.) Do not drive or use heavy machinery while taking prescription pain medicine. Activity Avoid activities that take a lot of effort for at least 7 days after your procedure. Do not lift anything that is heavier than 5 lb (4.5 kg) for one week.  No sexual activity for 1 week.  Return to your normal activities as told by your health care provider. Ask your health care provider what activities are safe for you. General instructions Take  over-the-counter and prescription medicines only as told by your health care provider. Do not use any products that contain nicotine or tobacco, such as cigarettes and e-cigarettes. If you need help quitting, ask your health care provider. You may shower after 24 hours, but Do not take baths, swim, or use a hot tub for 1 week.  Do not drink alcohol for 24 hours after your procedure. Keep all follow-up visits as told by your health care provider. This is important. Contact a health care provider if: You have redness, mild swelling, or pain around your puncture site. You have fluid or blood coming from your puncture site that stops after applying firm pressure to the area. Your puncture site feels warm to the touch. You have pus or a bad smell coming from your puncture site. You have a fever. You have chest pain or discomfort that spreads to your neck, jaw, or arm. You have chest pain that is worse with lying on your back or taking a deep breath. You are sweating a lot. You feel nauseous. You have a fast or irregular heartbeat. You have shortness of breath. You are dizzy or light-headed and feel the need to lie down. You have pain or numbness in the arm or leg closest to your puncture site. Get help right away if: Your puncture site suddenly swells. Your puncture site is bleeding and the bleeding does not stop after applying firm pressure to the area. These symptoms may represent a serious problem that is an emergency. Do not wait to see if the symptoms will go away. Get medical help right away. Call your local emergency services (911 in the [place] Do not drive yourself to the hospital. Summary After the procedure, it is normal to have bruising and tenderness at the puncture site in your groin, neck, or forearm. Check your puncture site every day for signs of infection. Get help right away if your puncture site is bleeding and the bleeding does not stop after applying firm pressure to the  area. This is a medical emergency. This information is not intended to replace advice given to you by your health care provider. Make sure you discuss any questions you have with your health care provider.

## 2023-02-12 NOTE — Transfer of Care (Signed)
Immediate Anesthesia Transfer of Care Note  Patient: David Wright  Procedure(s) Performed: ATRIAL FIBRILLATION ABLATION  Patient Location: PACU  Anesthesia Type:General  Level of Consciousness: awake, alert , and oriented  Airway & Oxygen Therapy: Patient Spontanous Breathing and Patient connected to nasal cannula oxygen  Post-op Assessment: Report given to RN and Post -op Vital signs reviewed and stable  Post vital signs: Reviewed and stable  Last Vitals:  Vitals Value Taken Time  BP 101/51 02/12/23 1105  Temp    Pulse 51 02/12/23 1106  Resp 11 02/12/23 1106  SpO2 94 % 02/12/23 1106  Vitals shown include unvalidated device data.  Last Pain:  Vitals:   02/12/23 0644  TempSrc:   PainSc: 0-No pain         Complications: There were no known notable events for this encounter.

## 2023-02-12 NOTE — H&P (Signed)
Electrophysiology Office Note:    Date:  02/12/2023   ID:  David Wright, DOB [birth year], MRN [id number]  PCP:  Seward Carol, [place] [facility] Cardiologist:  Candee Furbish, MD Electrophysiologist:  Melida Quitter, MD     Referring MD: No ref. provider found   Chief complaint: PAF  History of Present Illness:    David Wright is a 77 y.o. male with a hx of HLD, AF referred for arrhythmia management.  He has symptomatic paroxysmal AF. His burden has historically been very low, a few episodes a year. He has been managed with flecainide, metoprolol, and apixaban.   He has been having a few more episodes than usual recently. He will often take an extra tablet of flecainide. The episodes may last a few minutes or hours. He knows immediately when he has AF and becomes more fatigued and short of breath.  Since our last visit, he has been doing quite well. He had a coronary angiogram that did not show any clinically significant obstruction.  Because he had recurrence on flecainide, it was discontinued.  Amiodarone was started, and he has been doing very well on it without any symptoms of AF recurrence.   He denies any recent chest pain. He has not had syncope, pre-syncope.  I reviewed the patient's CT and labs. There was no LAA thrombus. he  has not missed any doses of anticoagulation, and he took his dose last night. There have been no changes in the patient's diagnoses, medications, or condition since our recent clinic visit.   Past Medical History:  Diagnosis Date   Anginal pain ([place])    has been worked-up but all neg.   Arthritis    Asthma    well controlled   Atrial tachycardia, paroxysmal    Complication of anesthesia 40 yrs.ago   spinal headache   Dysrhythmia    A-Fibrillation   Headache    Hypercholesteremia    pt reports that his cholesterol is normal   PAC (premature atrial contraction)    Paroxysmal atrial fibrillation ([facility])    Snores    Spinal  headache    40 years ago    Past Surgical History:  Procedure Laterality Date   COLONOSCOPY WITH PROPOFOL N/A 09/23/2016   Procedure: COLONOSCOPY WITH PROPOFOL;  Surgeon: Garlan Fair, MD;  Location: [facility];  Service: Endoscopy;  Laterality: N/A;   EYE SURGERY     age 27   INGUINAL HERNIA REPAIR Right 12/04/2016   Procedure: LAPAROSCOPIC RIGHT INGUINAL HERNIA WITH MESH;  Surgeon: Coralie Keens, MD;  Location: [facility];  Service: General;  Laterality: Right;   INSERTION OF MESH Right 12/04/2016   Procedure: INSERTION OF MESH;  Surgeon: Coralie Keens, MD;  Location: [facility];  Service: General;  Laterality: Right;   JOINT REPLACEMENT     LEFT HEART CATH AND CORONARY ANGIOGRAPHY N/A 11/14/2022   Procedure: LEFT HEART CATH AND CORONARY ANGIOGRAPHY;  Surgeon: Martinique, Peter M, MD;  Location: [facility];  Service: Cardiovascular;  Laterality: N/A;   perianal fistula     had spinal anesthesia and had spinal headache after surgery, 40 years ago   [place]  10/27/2012   RIGHT HIP    Current Medications: Current Meds  Medication Sig   amiodarone (PACERONE) 200 MG tablet Take 1 tablet (200 mg total) by mouth daily. (Patient taking differently: Take 100 mg by mouth daily.)   ELIQUIS 5 MG TABS  tablet TAKE 1 TABLET TWICE A DAY   fluticasone (FLONASE) 50 MCG/ACT nasal spray Place 2 sprays into the nose every evening.   Fluticasone-Salmeterol (ADVAIR) 100-50 MCG/DOSE AEPB Inhale 1 puff into the lungs every evening.   metoprolol succinate (TOPROL-XL) 25 MG 24 hr tablet TAKE ONE-HALF (1/2) TABLET AT BEDTIME   rosuvastatin (CRESTOR) 20 MG tablet Take 1 tablet (20 mg total) by mouth daily.     Allergies:   No known allergies   Social History   Socioeconomic History   Marital status: Married    Spouse name: Not on file   Number of children: Not on file   Years of education: Not on file   Highest education level: Not on file  Occupational History    Not on file  Tobacco Use   Smoking status: Never   Smokeless tobacco: Never  Vaping Use   Vaping Use: Never used  Substance and Sexual Activity   Alcohol use: Yes    Alcohol/week: 0.0 standard drinks of alcohol    Comment: occasionally a drink per week or less   Drug use: No   Sexual activity: Not on file  Other Topics Concern   Not on file  Social History Narrative   Pt lives in [place] with spouse.   Retired Nature conservation officer for [employer] Strain: Not on [employer] Insecurity: Not on file  Transportation Needs: Not on file  Physical Activity: Not on file  Stress: Not on file  Social Connections: Not on file     Family History: The patient's family history includes Heart attack in his father and mother; Heart disease in his father and mother; Pancreatic cancer in his sister.  ROS:   Please see the history of present illness.    All other systems reviewed and are negative.  EKGs/Labs/Other Studies Reviewed Today:     CT cardiac calcium score: 09/23/2022 914, 72 %ile. (Started rosuvastatin)  EKG:  Last EKG results: today - sinus rhythm   Recent Labs: 12/16/2022: ALT 30; TSH 1.570 01/24/2023: BUN 18; Creatinine, Ser 1.02; Hemoglobin 14.2; Platelets 196; Potassium 4.1; Sodium 144     Physical Exam:    VS:  BP (!) 167/87   Pulse 62   Temp (!) 97.2 F (36.2 C) (Temporal)   Resp 17   Ht 5\' 9"  (1.753 m)   Wt 74.8 kg   SpO2 96%   BMI 24.37 kg/m     Wt Readings from Last 3 Encounters:  02/12/23 74.8 kg  12/16/22 75.8 kg  12/09/22 75.3 kg     GEN: Well nourished, well developed in no acute distress CARDIAC: RRR, no murmurs, rubs, gallops RESPIRATORY:  Normal work of breathing MUSCULOSKELETAL: no edema    ASSESSMENT & PLAN:    Paroxysmal atrial fibrillation: symptomatic. Failed flecainide . Since our last visit, he has decided to proceed with an AF ablation. AF ablation has been  scheduled. We discussed the procedure in detail today. We discussed the indication, rationale, logistics, anticipated benefits, and potential risks of the ablation procedure including but not limited to -- bleed at the groin access site, chest pain, damage to nearby organs such as the diaphragm, lungs, or esophagus, need for a drainage tube, or prolonged hospitalization. I explained that the risk for stroke, heart attack, need for open chest surgery, or even death is very low but not zero. he  expressed understanding and wishes to proceed. 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 On Eliquis High risk medication: flecainide. ECG ok today renal function ok this month         Medication Adjustments/Labs and Tests Ordered: Current medicines are reviewed at length with the patient today.  Concerns regarding medicines are outlined above.  Orders Placed This Encounter  Procedures   Informed Consent Details: Physician/Practitioner Attestation; Transcribe to consent form and obtain patient signature   Initiate Pre-op Protocol   Void on call to EP Lab   Confirm CBC and BMP (or CMP) results within 7 days for inpatient and 30 days for outpatient:   Clip right and left femoral area PM before surgery   Clip right internal jugular area PM before surgery   Pre-admission testing diagnosis   EP STUDY   Insert peripheral IV   Meds ordered this encounter  Medications   0.9 %  sodium chloride infusion     Signed, Melida Quitter, MD  02/12/2023 7:15 AM    [place]

## 2023-02-12 NOTE — Anesthesia Preprocedure Evaluation (Signed)
Anesthesia Evaluation  Patient identified by MRN, date of birth, ID band Patient awake    Reviewed: Allergy & Precautions, NPO status , Patient's Chart, lab work & pertinent test results  History of Anesthesia Complications (+) POST - OP SPINAL HEADACHE and history of anesthetic complications  Airway Mallampati: III  TM Distance: >3 FB Neck ROM: Full    Dental  (+) Teeth Intact, Dental Advisory Given   Pulmonary neg shortness of breath, asthma , neg sleep apnea, neg COPD, neg recent URI   breath sounds clear to auscultation       Cardiovascular + CAD  (-) Past MI + dysrhythmias Atrial Fibrillation  Rhythm:Regular    Ost LM lesion is 45% stenosed.   Prox LAD lesion is 30% stenosed.   Ost Cx to Mid Cx lesion is 20% stenosed.   Mid RCA lesion is 50% stenosed.   RPAV lesion is 50% stenosed.   The left ventricular systolic function is normal.   LV end diastolic pressure is normal.   The left ventricular ejection fraction is 55-65% by visual estimate.   1. Nonobstructive CAD 2. Normal LV function 3. Normal LVEDP    1. Left ventricular ejection fraction, by estimation, is 55 to 60%. The  left ventricle has normal function. The left ventricle has no regional  wall motion abnormalities. Left ventricular diastolic parameters are  consistent with Grade I diastolic  dysfunction (impaired relaxation).   2. Right ventricular systolic function is normal. The right ventricular  size is mildly enlarged. Tricuspid regurgitation signal is inadequate for  assessing PA pressure.   3. Borderline bileaflet mitral valve prolapse. The mitral valve is  grossly normal. Trivial mitral valve regurgitation. No evidence of mitral  stenosis.   4. The aortic valve is grossly normal. There is mild calcification of the  aortic valve. Aortic valve regurgitation is trivial. No aortic stenosis is  present.   5. Pulmonic valve regurgitation is  moderate.   6. The inferior vena cava is normal in size with greater than 50%  respiratory variability, suggesting right atrial pressure of 3 mmHg.     Neuro/Psych  Headaches  negative psych ROS   GI/Hepatic negative GI ROS, Neg liver ROS,,,  Endo/Other  negative endocrine ROS    Renal/GU negative Renal ROS     Musculoskeletal  (+) Arthritis ,    Abdominal   Peds  Hematology  (+) Blood dyscrasia Eliquis  Lab Results      Component                Value               Date                      WBC                      6.6                 01/24/2023                HGB                      14.2                01/24/2023                HCT  41.4                01/24/2023                MCV                      93                  01/24/2023                PLT                      196                 01/24/2023              Anesthesia Other Findings   Reproductive/Obstetrics                              Anesthesia Physical Anesthesia Plan  ASA: 2  Anesthesia Plan: General   Post-op Pain Management: Minimal or no pain anticipated   Induction: Intravenous  PONV Risk Score and Plan: 2 and Ondansetron, Dexamethasone, Propofol infusion and TIVA  Airway Management Planned: Oral ETT  Additional Equipment: None  Intra-op Plan:   Post-operative Plan: Extubation in OR  Informed Consent: I have reviewed the patients History and Physical, chart, labs and discussed the procedure including the risks, benefits and alternatives for the proposed anesthesia with the patient or authorized representative who has indicated his/her understanding and acceptance.       Plan Discussed with: CRNA  Anesthesia Plan Comments:          Anesthesia Quick Evaluation

## 2023-02-13 ENCOUNTER — Encounter (HOSPITAL_COMMUNITY): Payer: Self-pay | Admitting: Cardiovascular Disease

## 2023-02-13 LAB — POCT ACTIVATED CLOTTING TIME
Activated Clotting Time: 287 seconds
Activated Clotting Time: 298 seconds

## 2023-02-13 MED FILL — Lidocaine HCl Local Preservative Free (PF) Inj 1%: INTRAMUSCULAR | Qty: 30 | Status: CN

## 2023-02-13 NOTE — Anesthesia Postprocedure Evaluation (Signed)
Anesthesia Post Note  Patient: David Wright  Procedure(s) Performed: ATRIAL FIBRILLATION ABLATION     Patient location during evaluation: Cath Lab Anesthesia Type: General Level of consciousness: awake and alert Pain management: pain level controlled Vital Signs Assessment: post-procedure vital signs reviewed and stable Respiratory status: spontaneous breathing, nonlabored ventilation and respiratory function stable Cardiovascular status: blood pressure returned to baseline and stable Postop Assessment: no apparent nausea or vomiting Anesthetic complications: no  There were no known notable events for this encounter.  Last Vitals:  Vitals:   02/12/23 1400 02/12/23 1500  BP: 106/62 124/77  Pulse: (!) 52 (!) 55  Resp: 17 18  Temp:    SpO2: 92% 93%    Last Pain:  Vitals:   02/12/23 1147  TempSrc:   PainSc: (S) 0-No pain                 Katalea Ucci

## 2023-03-12 ENCOUNTER — Ambulatory Visit (HOSPITAL_COMMUNITY)
Admission: RE | Admit: 2023-03-12 | Discharge: 2023-03-12 | Disposition: A | Discharge status: Home / Self Care | Payer: Medicare Other | Source: Ambulatory Visit | Attending: Cardiovascular Disease | Admitting: Cardiovascular Disease

## 2023-03-12 ENCOUNTER — Encounter (HOSPITAL_COMMUNITY): Payer: Self-pay | Admitting: Physician Assistant

## 2023-03-12 VITALS — BP 144/74 | HR 49 | Ht 69.0 in | Wt 166.2 lb

## 2023-03-12 DIAGNOSIS — Z5181 Encounter for therapeutic drug level monitoring: Secondary | ICD-10-CM | POA: Diagnosis not present

## 2023-03-12 DIAGNOSIS — I480 Paroxysmal atrial fibrillation: Secondary | ICD-10-CM

## 2023-03-12 DIAGNOSIS — Z79899 Other long term (current) drug therapy: Secondary | ICD-10-CM | POA: Insufficient documentation

## 2023-03-12 DIAGNOSIS — Z7901 Long term (current) use of anticoagulants: Secondary | ICD-10-CM | POA: Insufficient documentation

## 2023-03-12 DIAGNOSIS — I2510 Atherosclerotic heart disease of native coronary artery without angina pectoris: Secondary | ICD-10-CM | POA: Insufficient documentation

## 2023-03-12 DIAGNOSIS — D6869 Other thrombophilia: Secondary | ICD-10-CM | POA: Insufficient documentation

## 2023-03-12 DIAGNOSIS — Z8249 Family history of ischemic heart disease and other diseases of the circulatory system: Secondary | ICD-10-CM | POA: Diagnosis not present

## 2023-03-12 NOTE — Progress Notes (Signed)
Primary Care Physician: Renford Dills, MD Primary Cardiologist: Dr Anne Fu Primary Electrophysiologist: Dr Nelly Laurence Referring Physician: Dr Mable Paris Boursiquot is a 77 y.o. male with a history of CAD, HLD, atrial fibrillation who presents for follow up in the [facility].  The patient was initially diagnosed with atrial fibrillation remotely and had been maintained on flecainide. He began having more breakthrough episodes of afib and was evaluated by Dr Nelly Laurence who recommended ablation. Flecainide was discontinued and he was started on an amiodarone bridge. Cardiac CT showed elevated CAC score of 914. He underwent LHC which showed moderate disease, no PCI. Patient is s/p afib ablation 02/12/23 with Dr Nelly Laurence. Patient is on Eliquis for a CHADS2VASC score of 3.  On follow up today, patient reports that he has done well since the ablation with no tachypalpitations. He denies chest pain, swallowing pain, or groin issues. No bleeding issues on anticoagulation.   Today, he denies symptoms of palpitations, chest pain, shortness of breath, orthopnea, PND, lower extremity edema, dizziness, presyncope, syncope, snoring, daytime somnolence, bleeding, or neurologic sequela. The patient is tolerating medications without difficulties and is otherwise without complaint today.    Atrial Fibrillation Risk Factors:  he does not have symptoms or diagnosis of sleep apnea. he does not have a history of rheumatic fever.   he has a BMI of Body mass index is 24.54 kg/m.Marland Kitchen Filed Weights   03/12/23 1129  Weight: 75.4 kg    Family History  Problem Relation Age of Onset   Heart attack Mother    Heart disease Mother    Heart attack Father    Heart disease Father    Pancreatic cancer Sister      Atrial Fibrillation Management history:  Previous antiarrhythmic drugs: flecainide, amiodarone  Previous cardioversions: none Previous ablations: 02/12/23 Anticoagulation history:  Eliquis   Past Medical History:  Diagnosis Date   Anginal pain    has been worked-up but all neg.   Arthritis    Asthma    well controlled   Atrial tachycardia, paroxysmal    Complication of anesthesia 40 yrs.ago   spinal headache   Dysrhythmia    A-Fibrillation   Headache    Hypercholesteremia    pt reports that his cholesterol is normal   PAC (premature atrial contraction)    Paroxysmal atrial fibrillation    Snores    Spinal headache    40 years ago   Past Surgical History:  Procedure Laterality Date   ATRIAL FIBRILLATION ABLATION N/A 02/12/2023   Procedure: ATRIAL FIBRILLATION ABLATION;  Surgeon: Maurice Small, MD;  Location: [facility] INVASIVE CV LAB;  Service: Cardiovascular;  Laterality: N/A;   COLONOSCOPY WITH PROPOFOL N/A 09/23/2016   Procedure: COLONOSCOPY WITH PROPOFOL;  Surgeon: Charolett Bumpers, MD;  Location: [facility] ENDOSCOPY;  Service: Endoscopy;  Laterality: N/A;   EYE SURGERY     age 2   INGUINAL HERNIA REPAIR Right 12/04/2016   Procedure: LAPAROSCOPIC RIGHT INGUINAL HERNIA WITH MESH;  Surgeon: Abigail Miyamoto, MD;  Location: [facility] OR;  Service: General;  Laterality: Right;   INSERTION OF MESH Right 12/04/2016   Procedure: INSERTION OF MESH;  Surgeon: Abigail Miyamoto, MD;  Location: [facility] OR;  Service: General;  Laterality: Right;   JOINT REPLACEMENT     LEFT HEART CATH AND CORONARY ANGIOGRAPHY N/A 11/14/2022   Procedure: LEFT HEART CATH AND CORONARY ANGIOGRAPHY;  Surgeon: Swaziland, Peter M, MD;  Location: [facility] INVASIVE CV LAB;  Service: Cardiovascular;  Laterality:  N/A;   perianal fistula     had spinal anesthesia and had spinal headache after surgery, 40 years ago   TONSILLECTOMY     TOTAL HIP ARTHROPLASTY  10/27/2012   RIGHT HIP    Current Outpatient Medications  Medication Sig Dispense Refill   amiodarone (PACERONE) 200 MG tablet Take 0.5 tablets (100 mg total) by mouth daily.     ELIQUIS 5 MG TABS tablet TAKE 1 TABLET TWICE A DAY 180 tablet 3   fluticasone  (FLONASE) 50 MCG/ACT nasal spray Place 2 sprays into the nose every evening.     Fluticasone-Salmeterol (ADVAIR) 100-50 MCG/DOSE AEPB Inhale 1 puff into the lungs every evening.     metoprolol succinate (TOPROL-XL) 25 MG 24 hr tablet TAKE ONE-HALF (1/2) TABLET AT BEDTIME 45 tablet 3   pantoprazole (PROTONIX) 40 MG tablet Take 1 tablet (40 mg total) by mouth daily. 30 tablet 0   rosuvastatin (CRESTOR) 20 MG tablet Take 1 tablet (20 mg total) by mouth daily. 15 tablet 0   colchicine 0.6 MG tablet Take 1 tablet (0.6 mg total) by mouth 2 (two) times daily for 5 days. 10 tablet 0   No current facility-administered medications for this encounter.    No Known Allergies  Social History   Socioeconomic History   Marital status: Married    Spouse name: Not on file   Number of children: Not on file   Years of education: Not on file   Highest education level: Not on file  Occupational History   Not on file  Tobacco Use   Smoking status: Never   Smokeless tobacco: Never   Tobacco comments:    Never smoke 03/12/23  Vaping Use   Vaping Use: Never used  Substance and Sexual Activity   Alcohol use: Yes    Alcohol/week: 0.0 standard drinks of alcohol    Comment: occasionally a drink per week or less   Drug use: No   Sexual activity: Not on file  Other Topics Concern   Not on file  Social History Narrative   Pt lives in [place] with spouse.   Retired Corporate investment banker for [employer]   Social Determinants of Corporate investment banker Strain: Not on [employer] Insecurity: Not on file  Transportation Needs: Not on file  Physical Activity: Not on file  Stress: Not on file  Social Connections: Not on file  Intimate Partner Violence: Not on file     ROS- All systems are reviewed and negative except as per the HPI above.  Physical Exam: Vitals:   03/12/23 1129  BP: (!) 144/74  Pulse: (!) 49  Weight: 75.4 kg  Height: 5\' 9"  (1.753 m)    GEN- The patient is a  well appearing male, alert and oriented x 3 today.   Head- normocephalic, atraumatic Eyes-  Sclera clear, conjunctiva pink Ears- hearing intact Oropharynx- clear Neck- supple  Lungs- Clear to ausculation bilaterally, normal work of breathing Heart- Regular rate and rhythm, bradycardia, no murmurs, rubs or gallops  GI- soft, NT, ND, + BS Extremities- no clubbing, cyanosis, or edema MS- no significant deformity or atrophy Skin- no rash or lesion Psych- euthymic mood, full affect Neuro- strength and sensation are intact  Wt Readings from Last 3 Encounters:  03/12/23 75.4 kg  02/12/23 74.8 kg  12/16/22 75.8 kg    EKG today demonstrates  SB Vent. rate 49 BPM PR interval 160 ms QRS duration 88 ms QT/QTcB 512/462 ms  Echo 10/26/21  demonstrated  1. Left ventricular ejection fraction, by estimation, is 55 to 60%. The  left ventricle has normal function. The left ventricle has no regional  wall motion abnormalities. Left ventricular diastolic parameters are  consistent with Grade I diastolic dysfunction (impaired relaxation).   2. Right ventricular systolic function is normal. The right ventricular  size is mildly enlarged. Tricuspid regurgitation signal is inadequate for  assessing PA pressure.   3. Borderline bileaflet mitral valve prolapse. The mitral valve is  grossly normal. Trivial mitral valve regurgitation. No evidence of mitral  stenosis.   4. The aortic valve is grossly normal. There is mild calcification of the  aortic valve. Aortic valve regurgitation is trivial. No aortic stenosis is  present.   5. Pulmonic valve regurgitation is moderate.   6. The inferior vena cava is normal in size with greater than 50%  respiratory variability, suggesting right atrial pressure of 3 mmHg.   Epic records are reviewed at length today.  CHA2DS2-VASc Score = 3  The patient's score is based upon: CHF History: 0 HTN History: 0 Diabetes History: 0 Stroke History: 0 Vascular Disease  History: 1 Age Score: 2 Gender Score: 0       ASSESSMENT AND PLAN: 1. Paroxysmal Atrial Fibrillation (ICD10:  I48.0) The patient's CHA2DS2-VASc score is 3, indicating a 3.2% annual risk of stroke.   S/p afib ablation 02/12/23 Patient appears to be maintaining SR.  Continue amiodarone 100 mg daily for now, anticipate this will be short term post ablation.  Continue Toprol 12.5 mg daily Continue Eliquis 5 mg BID with no missed doses for 3 months post ablation.   2. Secondary Hypercoagulable State (ICD10:  D68.69) The patient is at significant risk for stroke/thromboembolism based upon his CHA2DS2-VASc Score of 3.  Continue Apixaban (Eliquis).   3. CAD On statin No anginal symptoms.    Follow up with Dr Nelly Laurence as scheduled.    Jorja Loa PA-C Afib Clinic [facility] [address] [place], [place] [address] [phone] 03/12/2023 12:12 PM

## 2023-04-14 ENCOUNTER — Ambulatory Visit: Payer: Medicare Other | Admitting: Cardiovascular Disease

## 2023-05-13 ENCOUNTER — Encounter: Payer: Self-pay | Admitting: Cardiovascular Disease

## 2023-05-13 ENCOUNTER — Ambulatory Visit: Payer: Medicare Other | Attending: Cardiovascular Disease | Admitting: Cardiovascular Disease

## 2023-05-13 VITALS — BP 120/66 | HR 45 | Ht 69.0 in | Wt 164.0 lb

## 2023-05-13 DIAGNOSIS — I480 Paroxysmal atrial fibrillation: Secondary | ICD-10-CM | POA: Diagnosis not present

## 2023-05-13 NOTE — Addendum Note (Signed)
Addended by: Sherle Poe R on: 05/13/2023 12:45 PM   Modules accepted: Orders

## 2023-05-13 NOTE — Patient Instructions (Addendum)
Medication Instructions:  STOP Amiodarone - patient made aware *If you need a refill on your cardiac medications before your next appointment, please call your pharmacy*   Follow-Up: At [facility], you and your health needs are our priority.  As part of our continuing mission to provide you with exceptional heart care, we have created designated Provider Care Teams.  These Care Teams include your primary Cardiologist (physician) and Advanced Practice Providers (APPs -  Physician Assistants and Nurse Practitioners) who all work together to provide you with the care you need, when you need it.  We recommend signing up for the patient portal called "MyChart".  Sign up information is provided on this After Visit Summary.  MyChart is used to connect with patients for Virtual Visits (Telemedicine).  Patients are able to view lab/test results, encounter notes, upcoming appointments, etc.  Non-urgent messages can be sent to your provider as well.   To learn more about what you can do with MyChart, go to [email or web address].    Your next appointment:   6 month(s)  Provider:   York Pellant, MD

## 2023-05-13 NOTE — Progress Notes (Signed)
  Electrophysiology Office Note:    Date:  05/13/2023   ID:  David Wright, DOB 1946/09/16, MRN [id number]  PCP:  Renford Dills, MD   [facility] Providers Cardiologist:  Donato Schultz, MD Electrophysiologist:  Maurice Small, MD     Referring MD: Renford Dills, MD   Chief complaint: PAF  History of Present Illness:    David Wright is a 77 y.o. male with a hx of HLD, AF referred for arrhythmia management.  He has symptomatic paroxysmal AF. His burden has historically been very low, a few episodes a year. He has been managed with flecainide, metoprolol, and apixaban.   He has been having a few more episodes than usual recently. He will often take an extra tablet of flecainide. The episodes may last a few minutes or hours. He knows immediately when he has AF and becomes more fatigued and short of breath.  Since our last visit, he has been doing quite well. He had a coronary angiogram that did not show any clinically significant obstruction.  Because he had recurrence on flecainide, it was discontinued.  Amiodarone was started, and he has been doing very well on it without any symptoms of AF recurrence.  He underwent uncomplicated atrial fibrillation ablation on February 12, 2023.  Prior to ablation, his CT showed an elevated CAC of 914.  Subsequent left heart cath showed moderate coronary disease; PCI was not indicated.  Since the ablation, he reports that he has not had any symptoms of A-fib recurrence.  He continues to take amiodarone 100 mg daily.  He has not noticed some fatigue.  Heart rates at home have been in the 50s.    EKGs/Labs/Other Studies Reviewed Today:     CT cardiac calcium score: 09/23/2022 914, 72 %ile. (Started rosuvastatin)  EKG:  Last EKG results: today - see Epic   Recent Labs: 12/16/2022: ALT 30; TSH 1.570 01/24/2023: BUN 18; Creatinine, Ser 1.02; Hemoglobin 14.2; Platelets 196; Potassium 4.1; Sodium 144     Physical Exam:    VS:  BP 120/66    Pulse (!) 45   Ht 5\' 9"  (1.753 m)   Wt 164 lb (74.4 kg)   SpO2 97%   BMI 24.22 kg/m     Wt Readings from Last 3 Encounters:  05/13/23 164 lb (74.4 kg)  03/12/23 166 lb 3.2 oz (75.4 kg)  02/12/23 165 lb (74.8 kg)     GEN: Well nourished, well developed in no acute distress CARDIAC: RRR, no murmurs, rubs, gallops RESPIRATORY:  Normal work of breathing MUSCULOSKELETAL: no edema    ASSESSMENT & PLAN:    Paroxysmal atrial fibrillation:  symptomatic.  Failed flecainide -had recurrence.  Flecainide is CI with coronary disease Doing well after ablation Discontinue amiodarone  Secondary hypercoagulable state:  due to AF, CHA2DS2-VASc score of 3.  Continue Eliquis 5 mg  High risk medication: flecainide; discontinue flecainide Discontinued due to CAD       Medication Adjustments/Labs and Tests Ordered: Current medicines are reviewed at length with the patient today.  Concerns regarding medicines are outlined above.  Orders Placed This Encounter  Procedures   EKG 12-Lead   No orders of the defined types were placed in this encounter.    Signed, Maurice Small, MD  05/13/2023 12:42 PM    [facility]

## 2023-05-19 ENCOUNTER — Other Ambulatory Visit (HOSPITAL_COMMUNITY): Payer: Self-pay

## 2023-07-02 ENCOUNTER — Ambulatory Visit: Payer: Medicare Other | Attending: Cardiology | Admitting: Cardiology

## 2023-07-02 ENCOUNTER — Encounter: Payer: Self-pay | Admitting: Cardiology

## 2023-07-02 VITALS — BP 126/74 | HR 66 | Ht 69.0 in | Wt 163.8 lb

## 2023-07-02 DIAGNOSIS — I2510 Atherosclerotic heart disease of native coronary artery without angina pectoris: Secondary | ICD-10-CM | POA: Insufficient documentation

## 2023-07-02 DIAGNOSIS — I480 Paroxysmal atrial fibrillation: Secondary | ICD-10-CM | POA: Insufficient documentation

## 2023-07-02 DIAGNOSIS — Z9889 Other specified postprocedural states: Secondary | ICD-10-CM | POA: Diagnosis not present

## 2023-07-02 DIAGNOSIS — I2584 Coronary atherosclerosis due to calcified coronary lesion: Secondary | ICD-10-CM | POA: Diagnosis not present

## 2023-07-02 DIAGNOSIS — E7800 Pure hypercholesterolemia, unspecified: Secondary | ICD-10-CM | POA: Insufficient documentation

## 2023-07-02 DIAGNOSIS — Z79899 Other long term (current) drug therapy: Secondary | ICD-10-CM | POA: Insufficient documentation

## 2023-07-02 DIAGNOSIS — Z8679 Personal history of other diseases of the circulatory system: Secondary | ICD-10-CM | POA: Insufficient documentation

## 2023-07-02 NOTE — Patient Instructions (Signed)
Medication Instructions:  Please discontinue your Metoprolol. Continue all other medications as listed.  *If you need a refill on your cardiac medications before your next appointment, please call your pharmacy*   Lab Work: Please have blood work today (CBC, CMP and Lipid)  If you have labs (blood work) drawn today and your tests are completely normal, you will receive your results only by: MyChart Message (if you have MyChart) OR A paper copy in the mail If you have any lab test that is abnormal or we need to change your treatment, we will call you to review the results.   Follow-Up: At [facility], you and your health needs are our priority.  As part of our continuing mission to provide you with exceptional heart care, we have created designated Provider Care Teams.  These Care Teams include your primary Cardiologist (physician) and Advanced Practice Providers (APPs -  Physician Assistants and Nurse Practitioners) who all work together to provide you with the care you need, when you need it.  We recommend signing up for the patient portal called "MyChart".  Sign up information is provided on this After Visit Summary.  MyChart is used to connect with patients for Virtual Visits (Telemedicine).  Patients are able to view lab/test results, encounter notes, upcoming appointments, etc.  Non-urgent messages can be sent to your provider as well.   To learn more about what you can do with MyChart, go to [email or web address].    Your next appointment:   6 month(s)  Provider:   Donato Schultz, MD

## 2023-07-02 NOTE — Progress Notes (Signed)
  Cardiology Office Note:  .   Date:  07/02/2023  ID:  David Wright, DOB Mar 26, 1946, MRN [id number] PCP: Renford Dills, MD  [facility] Providers Cardiologist:  Donato Schultz, MD Electrophysiologist:  Maurice Small, MD     History of Present Illness: .   David Wright is a 77 y.o. male here for follow-up of paroxysmal atrial fibrillation.  Recently seen by Dr. Shirlyn Goltz in June 2024  Had been managed with flecainide metoprolol and apixaban in the past.  Symptomatic.  Back in March 2024 underwent ablation.  Left heart catheter showed moderate level coronary disease PCI was not indicated after coronary CT scan showed elevated calcium score of 914.  Since his ablation he has had no recurrence of atrial fibrillation.  Doing well.  Had been on amiodarone 100 mg a day for maintenance.  This was discontinued at last visit with Dr. Wilhelmenia Blase.  ROS: No obvious bleeding no syncope no chest pain.  Positive fatigue  Studies Reviewed: Marland Kitchen        Preablation CT noted cardiac cath reviewed see below Risk Assessment/Calculations:    CHA2DS2-VASc Score = 3   This indicates a 3.2% annual risk of stroke. The patient's score is based upon: CHF History: 0 HTN History: 0 Diabetes History: 0 Stroke History: 0 Vascular Disease History: 1 Age Score: 2 Gender Score: 0            Physical Exam:   VS:  BP 126/74   Pulse 66   Ht 5\' 9"  (1.753 m)   Wt 163 lb 12.8 oz (74.3 kg)   SpO2 94%   BMI 24.19 kg/m    Wt Readings from Last 3 Encounters:  07/02/23 163 lb 12.8 oz (74.3 kg)  05/13/23 164 lb (74.4 kg)  03/12/23 166 lb 3.2 oz (75.4 kg)    GEN: Well nourished, well developed in no acute distress NECK: No JVD; No carotid bruits CARDIAC: RRR, no murmurs, rubs, gallops RESPIRATORY:  Clear to auscultation without rales, wheezing or rhonchi  ABDOMEN: Soft, non-tender, non-distended EXTREMITIES:  No edema; No deformity, forearm bruising  ASSESSMENT AND PLAN: .    Paroxysmal atrial  fibrillation - Post ablation March 2024.  Dr. Wilhelmenia Blase.  Doing well.  No recurrence.  Off of flecainide especially since underlying moderate coronary artery disease was present.  Off of amiodarone.  Doing well.  Still feeling some tiredness.  Chronic anticoagulation - Continuing with Eliquis 5 mg twice a day.  CHA2DS2-VASc or was 3.  He did ask about coming off of the Eliquis.  I did express that there is a chance that he may go back into atrial fibrillation and he should be protected.  We did discuss watchman device.  He may be interested in this.  I would like for him to at least have 6 months out from [place] before considering.  He will message me if he desires this therapy.  He does have easy bruising.  Forearm bruising.  Coronary artery disease - Moderate on catheterization prior to atrial fibrillation ablation.  No PCI needed.  Continue with goal-directed medical therapy  Hyperlipidemia - LDL 51 in March or 2024 on rosuvastatin 20 mg.  Excellent.  We will go ahead and stop the metoprolol 12.5 mg.  See if this helps with his sensation of fatigue.      Dispo: 6 mth  Signed, Donato Schultz, MD

## 2023-08-07 ENCOUNTER — Ambulatory Visit: Payer: Medicare Other | Attending: Cardiology | Admitting: Cardiology

## 2023-08-07 ENCOUNTER — Telehealth: Payer: Self-pay | Admitting: Cardiovascular Disease

## 2023-08-07 ENCOUNTER — Encounter: Payer: Self-pay | Admitting: Cardiology

## 2023-08-07 VITALS — BP 112/70 | HR 72 | Ht 69.0 in | Wt 163.2 lb

## 2023-08-07 DIAGNOSIS — I480 Paroxysmal atrial fibrillation: Secondary | ICD-10-CM | POA: Insufficient documentation

## 2023-08-07 DIAGNOSIS — E7800 Pure hypercholesterolemia, unspecified: Secondary | ICD-10-CM | POA: Diagnosis not present

## 2023-08-07 DIAGNOSIS — Z79899 Other long term (current) drug therapy: Secondary | ICD-10-CM | POA: Diagnosis present

## 2023-08-07 MED ORDER — AMIODARONE HCL 200 MG PO TABS: 200.0000 mg | ORAL_TABLET | Freq: Two times a day (BID) | ORAL | 3 refills | Status: DC

## 2023-08-07 NOTE — Progress Notes (Signed)
Cardiology Office Note:  .   Date:  08/07/2023  ID:  David Wright, DOB 1946/02/04, MRN [id number] PCP: Renford Dills, MD  [facility] Providers Cardiologist:  Donato Schultz, MD Electrophysiologist:  Maurice Small, MD    History of Present Illness: .   David Wright is a 77 y.o. male here with AFIB RVR once again. Dr. Nelly Laurence ablation 01/2023. Previously had no recurrerance of AFIB after ablation so Amiodarone with stopped. Unfortunately returned to AFIB.    Left heart catheter showed moderate level coronary disease PCI was not indicated after coronary CT scan showed elevated calcium score of 914.   Discussed the use of AI scribe software for clinical note transcription with the patient, who gave verbal consent to proceed.  History of Present Illness    The patient, with a history of atrial fibrillation (AFib) and recent ablation, presents with a recurrence of AFib symptoms. He reports feeling unwell and winded after physical activity, which is a new development since his last appointment. The patient has been monitoring his heart rhythm at home and has recorded an episode of tachycardia. He has been off amiodarone for a couple of months but took a dose of 200mg  on the day of the appointment due to feeling unwell. The patient also reports feeling tired, but it is unclear whether this is related to his AFib or other factors.         Studies Reviewed: Marland Kitchen   EKG Interpretation Date/Time:  Thursday August 07 2023 11:35:37 EDT Ventricular Rate:  59 PR Interval:  148 QRS Duration:  80 QT Interval:  426 QTC Calculation: 421 R Axis:   52  Text Interpretation: Sinus bradycardia Possible Inferior infarct , age undetermined Cannot rule out Anterior infarct , age undetermined When compared with ECG of 13-May-2023 12:22, Borderline criteria for Inferior infarct are now Present T wave inversion now evident in Inferior leads Confirmed by Donato Schultz ([id number]) on 08/07/2023 12:00:30 PM     Kardia: Atrial fibrillation, 152 bpm (08/07/2023) Risk Assessment/Calculations:    CHA2DS2-VASc Score = 3   This indicates a 3.2% annual risk of stroke. The patient's score is based upon: CHF History: 0 HTN History: 0 Diabetes History: 0 Stroke History: 0 Vascular Disease History: 1 Age Score: 2 Gender Score: 0           Physical Exam:   VS:  BP 112/70   Pulse 72   Ht 5\' 9"  (1.753 m)   Wt 163 lb 3.2 oz (74 kg)   SpO2 97%   BMI 24.10 kg/m    Wt Readings from Last 3 Encounters:  08/07/23 163 lb 3.2 oz (74 kg)  07/02/23 163 lb 12.8 oz (74.3 kg)  05/13/23 164 lb (74.4 kg)    GEN: Well nourished, well developed in no acute distress NECK: No JVD; No carotid bruits CARDIAC: RRR, no murmurs, rubs, gallops RESPIRATORY:  Clear to auscultation without rales, wheezing or rhonchi  ABDOMEN: Soft, non-tender, non-distended EXTREMITIES:  No edema; No deformity, minor bruise forearm.   ASSESSMENT AND PLAN: .        Atrial Fibrillation Recurrence of AFib after ablation in March. Patient self-administered Amiodarone 200mg  with resolution of symptoms. No chest pain or shortness of breath, but reported feeling winded after exertion. -Resume Amiodarone 200mg  twice daily for two weeks, then decrease to 200mg  daily. -Continue Eliquis for anticoagulation. -Notify Dr. Shirlyn Goltz for reassessment and consideration of repeat ablation.  General Health Maintenance Patient reports fatigue, but no other new symptoms. -  Continue Crestor as prescribed. -Follow-up with Dr. Nelly Laurence as well.             Dispo:   Signed, Donato Schultz, MD

## 2023-08-07 NOTE — Patient Instructions (Signed)
Medication Instructions:  Please restart Amiodarone 200 mg one tablet twice a day for 2 weeks then decrease to once daily. Continue all other medications as listed.  *If you need a refill on your cardiac medications before your next appointment, please call your pharmacy*   Follow-Up: At [facility], you and your health needs are our priority.  As part of our continuing mission to provide you with exceptional heart care, we have created designated Provider Care Teams.  These Care Teams include your primary Cardiologist (physician) and Advanced Practice Providers (APPs -  Physician Assistants and Nurse Practitioners) who all work together to provide you with the care you need, when you need it.  We recommend signing up for the patient portal called "MyChart".  Sign up information is provided on this After Visit Summary.  MyChart is used to connect with patients for Virtual Visits (Telemedicine).  Patients are able to view lab/test results, encounter notes, upcoming appointments, etc.  Non-urgent messages can be sent to your provider as well.   To learn more about what you can do with MyChart, go to [email or web address].    Your next appointment:   Next available with  Provider:   Dr Nelly Laurence

## 2023-08-07 NOTE — Telephone Encounter (Signed)
Called and spoke w patient.  He said he woke up this am in afib.  140s.  BP 119/72.  He has been off amiodarone for approx 3 months.  At 8:00 am he took 200 mg.  He is "wiped out" feeling.  I reviewed w Dr. Anne Fu who advised he can come this am for a visit.  He has been scheduled for 11:30 am.  Pt grateful for assistance.

## 2023-08-07 NOTE — Telephone Encounter (Signed)
Patient c/o Palpitations:  High priority if patient c/o lightheadedness, shortness of breath, or chest pain  How long have you had palpitations/irregular HR/ Afib? Are you having the symptoms now? This morning, and yes   Are you currently experiencing lightheadedness, SOB or CP? No    Do you have a history of afib (atrial fibrillation) or irregular heart rhythm? Yes   Have you checked your BP or HR? (document readings if available): HR-142 BP- 119/72  Are you experiencing any other symptoms? Fatigued

## 2023-08-07 NOTE — Addendum Note (Signed)
Addended by: Sharin Grave on: 08/07/2023 12:13 PM   Modules accepted: Orders

## 2023-08-12 ENCOUNTER — Encounter: Payer: Self-pay | Admitting: Cardiovascular Disease

## 2023-08-12 ENCOUNTER — Ambulatory Visit: Payer: Medicare Other | Attending: Cardiovascular Disease | Admitting: Cardiovascular Disease

## 2023-08-12 VITALS — BP 106/64 | HR 61 | Ht 69.0 in | Wt 162.0 lb

## 2023-08-12 DIAGNOSIS — I480 Paroxysmal atrial fibrillation: Secondary | ICD-10-CM | POA: Diagnosis present

## 2023-08-12 NOTE — Patient Instructions (Addendum)
Medication Instructions:  STOP Amiodarone  *If you need a refill on your cardiac medications before your next appointment, please call your pharmacy*  Testing/Procedures: Atrial Fibrillation Ablation - scheduled for March 5 Your physician has recommended that you have an ablation. Catheter ablation is a medical procedure used to treat some cardiac arrhythmias (irregular heartbeats). During catheter ablation, a long, thin, flexible tube is put into a blood vessel in your groin (upper thigh), or neck. This tube is called an ablation catheter. It is then guided to your heart through the blood vessel. Radio frequency waves destroy small areas of heart tissue where abnormal heartbeats may cause an arrhythmia to start. Please see the instruction sheet given to you today.   Follow-Up: At [facility], you and your health needs are our priority.  As part of our continuing mission to provide you with exceptional heart care, we have created designated Provider Care Teams.  These Care Teams include your primary Cardiologist (physician) and Advanced Practice Providers (APPs -  Physician Assistants and Nurse Practitioners) who all work together to provide you with the care you need, when you need it.  We recommend signing up for the patient portal called "MyChart".  Sign up information is provided on this After Visit Summary.  MyChart is used to connect with patients for Virtual Visits (Telemedicine).  Patients are able to view lab/test results, encounter notes, upcoming appointments, etc.  Non-urgent messages can be sent to your provider as well.   To learn more about what you can do with MyChart, go to [email or web address].    Your next appointment:     Provider:   York Pellant, MD   OTHER - T[place] information attached

## 2023-08-12 NOTE — Progress Notes (Signed)
Electrophysiology Office Note:    Date:  08/12/2023   ID:  David Wright, DOB [birth year], MRN [id number]  PCP:  Renford Dills, MD   [facility] Providers Cardiologist:  Donato Schultz, MD Electrophysiologist:  Maurice Small, MD     Referring MD: Renford Dills, MD   Chief complaint: PAF  History of Present Illness:    David Wright is a 77 y.o. male with a hx of HLD, AF referred for arrhythmia management.  He has symptomatic paroxysmal AF. His burden has historically been very low, a few episodes a year. He has been managed with flecainide, metoprolol, and apixaban.   He has been having a few more episodes than usual recently. He will often take an extra tablet of flecainide. The episodes may last a few minutes or hours. He knows immediately when he has AF and becomes more fatigued and short of breath.  Since our last visit, he has been doing quite well. He had a coronary angiogram that did not show any clinically significant obstruction.  Because he had recurrence on flecainide, it was discontinued.  Amiodarone was started, and he has been doing very well on it without any symptoms of AF recurrence.  He underwent uncomplicated atrial fibrillation ablation on February 12, 2023.  Prior to ablation, his CT showed an elevated CAC of 914.  Subsequent left heart cath showed moderate coronary disease; PCI was not indicated.  Since the ablation, he reports that he has not had any symptoms of A-fib recurrence.  He continues to take amiodarone 100 mg daily.  He has not noticed some fatigue.  Heart rates at home have been in the 50s.  On September 12 he called into the office complaining of awakening with heart rates of 140 bpm.  He resumed amiodarone and was seen the same day in clinic.  Atrial fibrillation had resolved at that point and the EKG showed sinus rhythm.  I reviewed Cardiol tracings from that morning that show atrial fibrillation with high ventricular rates.  EKGs/Labs/Other  Studies Reviewed Today:     CT cardiac calcium score: 09/23/2022 914, 72 %ile. (Started rosuvastatin)  EKG:  Last EKG results: today - see Epic   Recent Labs: 12/16/2022: TSH 1.570 07/02/2023: ALT 16; BUN 19; Creatinine, Ser 1.17; Hemoglobin 14.9; Platelets 219; Potassium 4.4; Sodium 141     Physical Exam:    VS:  BP 106/64   Pulse 61   Ht 5\' 9"  (1.753 m)   Wt 162 lb (73.5 kg)   SpO2 97%   BMI 23.92 kg/m     Wt Readings from Last 3 Encounters:  08/12/23 162 lb (73.5 kg)  08/07/23 163 lb 3.2 oz (74 kg)  07/02/23 163 lb 12.8 oz (74.3 kg)     GEN: Well nourished, well developed in no acute distress CARDIAC: RRR, no murmurs, rubs, gallops RESPIRATORY:  Normal work of breathing MUSCULOSKELETAL: no edema    ASSESSMENT & PLAN:    Paroxysmal atrial fibrillation:  symptomatic.  Failed flecainide - had recurrence.  Maintained sinus rhythm for months after the ablation, but has recently had an episode lasting about an hour or documented on his cardia device We will plan for repeat ablation. I will see him prior to the procedure; we may defer the procedure if he remains AF free in the mean time Continue amiodarone --he will resume this if he has another recurrence of atrial fibrillation  We discussed the indication, rationale, logistics, anticipated benefits, and potential risks of the ablation  procedure including but not limited to -- bleed at the groin access site, chest pain, damage to nearby organs such as the diaphragm, lungs, or esophagus, need for a drainage tube, or prolonged hospitalization. I explained that the risk for stroke, heart attack, need for open chest surgery, or even death is very low but not zero. he  expressed understanding and wishes to proceed.   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  Concerns regarding medicines are outlined above.  Orders Placed This Encounter  Procedures   EKG 12-Lead   No orders of the defined types were placed in this encounter.    Signed, Maurice Small, MD  08/12/2023 3:06 PM    [facility]

## 2023-10-17 DIAGNOSIS — J3089 Other allergic rhinitis: Secondary | ICD-10-CM | POA: Diagnosis not present

## 2023-10-17 DIAGNOSIS — J301 Allergic rhinitis due to pollen: Secondary | ICD-10-CM | POA: Diagnosis not present

## 2023-10-17 DIAGNOSIS — J4530 Mild persistent asthma, uncomplicated: Secondary | ICD-10-CM | POA: Diagnosis not present

## 2023-10-17 DIAGNOSIS — H1045 Other chronic allergic conjunctivitis: Secondary | ICD-10-CM | POA: Diagnosis not present

## 2023-11-07 ENCOUNTER — Other Ambulatory Visit: Payer: Self-pay | Admitting: Cardiology

## 2023-11-07 NOTE — Telephone Encounter (Signed)
Prescription refill request for Eliquis received. Indication:afib Last office visit:9/24 Scr:1.17  8/24 Age: 77 Weight:73.5  kg  Prescription refilled

## 2023-11-10 ENCOUNTER — Ambulatory Visit: Payer: Medicare Other | Attending: Cardiovascular Disease | Admitting: Cardiovascular Disease

## 2023-11-10 ENCOUNTER — Encounter: Payer: Self-pay | Admitting: Cardiovascular Disease

## 2023-11-10 VITALS — BP 110/70 | HR 61 | Ht 69.0 in | Wt 164.2 lb

## 2023-11-10 DIAGNOSIS — I4719 Other supraventricular tachycardia: Secondary | ICD-10-CM | POA: Insufficient documentation

## 2023-11-10 DIAGNOSIS — I480 Paroxysmal atrial fibrillation: Secondary | ICD-10-CM | POA: Insufficient documentation

## 2023-11-10 DIAGNOSIS — I491 Atrial premature depolarization: Secondary | ICD-10-CM | POA: Diagnosis not present

## 2023-11-10 NOTE — Patient Instructions (Signed)
Medication Instructions:  Your physician recommends that you continue on your current medications as directed. Please refer to the Current Medication list given to you today. *If you need a refill on your cardiac medications before your next appointment, please call your pharmacy*   Testing/Procedures: Atrial Fibrillation Ablation scheduled for 01/28/24 - we will contact you closer to time to confirm Your physician has recommended that you have an ablation. Catheter ablation is a medical procedure used to treat some cardiac arrhythmias (irregular heartbeats). During catheter ablation, a long, thin, flexible tube is put into a blood vessel in your groin (upper thigh), or neck. This tube is called an ablation catheter. It is then guided to your heart through the blood vessel. Radio frequency waves destroy small areas of heart tissue where abnormal heartbeats may cause an arrhythmia to start. Please see the instruction sheet given to you today.   Follow-Up: At [facility], you and your health needs are our priority.  As part of our continuing mission to provide you with exceptional heart care, we have created designated Provider Care Teams.  These Care Teams include your primary Cardiologist (physician) and Advanced Practice Providers (APPs -  Physician Assistants and Nurse Practitioners) who all work together to provide you with the care you need, when you need it.  We recommend signing up for the patient portal called "MyChart".  Sign up information is provided on this After Visit Summary.  MyChart is used to connect with patients for Virtual Visits (Telemedicine).  Patients are able to view lab/test results, encounter notes, upcoming appointments, etc.  Non-urgent messages can be sent to your provider as well.   To learn more about what you can do with MyChart, go to [email or web address].    Your next appointment:   We will set up follow up after ablation   Provider:   York Pellant,  MD

## 2023-11-10 NOTE — Progress Notes (Signed)
Electrophysiology Office Note:    Date:  11/10/2023   ID:  David Wright, DOB 06/09/1946, MRN [id number]  PCP:  Renford Dills, MD   [facility] Providers Cardiologist:  Donato Schultz, MD Electrophysiologist:  Maurice Small, MD     Referring MD: Renford Dills, MD   Chief complaint: PAF  History of Present Illness:    David Wright is a 77 y.o. male with a hx of HLD, AF referred for arrhythmia management.  He has symptomatic paroxysmal AF. His burden has historically been very low, a few episodes a year. He has been managed with flecainide, metoprolol, and apixaban.   He has been having a few more episodes than usual recently. He will often take an extra tablet of flecainide. The episodes may last a few minutes or hours. He knows immediately when he has AF and becomes more fatigued and short of breath.  Since our last visit, he has been doing quite well. He had a coronary angiogram that did not show any clinically significant obstruction.  Because he had recurrence on flecainide, it was discontinued.  Amiodarone was started, and he has been doing very well on it without any symptoms of AF recurrence.  He underwent uncomplicated atrial fibrillation ablation on February 12, 2023.  Prior to ablation, his CT showed an elevated CAC of 914.  Subsequent left heart cath showed moderate coronary disease; PCI was not indicated.  Since the ablation, he reports that he has not had any symptoms of A-fib recurrence.  He continues to take amiodarone 100 mg daily.  He has not noticed some fatigue.  Heart rates at home have been in the 50s.  On September 12 he called into the office complaining of awakening with heart rates of 140 bpm.  He resumed amiodarone and was seen the same day in clinic.  Atrial fibrillation had resolved at that point and the EKG showed sinus rhythm.  I reviewed tracings from that morning that show atrial fibrillation with high ventricular rates.  He remains off of  amiodarone and has not had another recurrence of atrial fibrillation.  EKGs/Labs/Other Studies Reviewed Today:     CT cardiac calcium score: 09/23/2022 914, 72 %ile. (Started rosuvastatin)  EKG:  Last EKG results: today - see Epic   Recent Labs: 12/16/2022: TSH 1.570 07/02/2023: ALT 16; BUN 19; Creatinine, Ser 1.17; Hemoglobin 14.9; Platelets 219; Potassium 4.4; Sodium 141     Physical Exam:    VS:  BP 110/70 (BP Location: Left Arm, Patient Position: Sitting, Cuff Size: Normal)   Pulse 61   Ht 5\' 9"  (1.753 m)   Wt 164 lb 3.2 oz (74.5 kg)   SpO2 99%   BMI 24.25 kg/m     Wt Readings from Last 3 Encounters:  11/10/23 164 lb 3.2 oz (74.5 kg)  08/12/23 162 lb (73.5 kg)  08/07/23 163 lb 3.2 oz (74 kg)     GEN: Well nourished, well developed in no acute distress CARDIAC: RRR, no murmurs, rubs, gallops RESPIRATORY:  Normal work of breathing MUSCULOSKELETAL: no edema    ASSESSMENT & PLAN:    Paroxysmal atrial fibrillation:  symptomatic.  Failed flecainide - had recurrence.  Maintained sinus rhythm for months after the ablation, but has recently had an episode lasting about an hour or documented on his cardia device We will plan for repeat ablation. I will see him prior to the procedure; we may defer the procedure if he remains AF free in the mean time He is off  amiodarone --he will resume this if he has another recurrence of atrial fibrillation He is scheduled for ablation on March 5, but we will cancel this if he remains AF free since he has had just one brief recurrence at this point.  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   Continue Eliquis 5 mg  Coronary disease Diffuse moderate CAD on Cath 11/14/2022       Medication Adjustments/Labs and Tests Ordered: Current medicines are reviewed at length with the patient today.  Concerns regarding medicines are outlined above.  Orders Placed This Encounter  Procedures   EKG 12-Lead   No  orders of the defined types were placed in this encounter.    Signed, Maurice Small, MD  11/10/2023 2:30 PM    [facility]

## 2023-12-26 ENCOUNTER — Telehealth: Payer: Self-pay

## 2023-12-26 NOTE — Telephone Encounter (Signed)
Spoke with patient, rescheduled ablation to 03/09/24. Patient has not had any reoccurrence of AF, hesitant about cancelling ablation at this point but wished to simply reschedule.

## 2024-01-05 ENCOUNTER — Ambulatory Visit: Payer: Medicare Other | Admitting: Cardiovascular Disease

## 2024-01-12 ENCOUNTER — Telehealth: Payer: Self-pay | Admitting: Cardiovascular Disease

## 2024-01-12 MED ORDER — AMIODARONE HCL 200 MG PO TABS: 200.0000 mg | ORAL_TABLET | Freq: Every day | ORAL | Status: DC

## 2024-01-12 NOTE — Telephone Encounter (Signed)
Pt c/o medication issue:  1. Name of Medication: Amiodarone  2. How are you currently taking this medication (dosage and times per day)?   3. Are you having a reaction (difficulty breathing--STAT)?   4. What is your medication issue?  Patient states he started this medication recently and would like a call back to discuss how long he is to take. Please advise.

## 2024-01-12 NOTE — Telephone Encounter (Signed)
Pt called to report that he went into Afib yesterday with heart rate 170... he took an Amiodarone 200 mg as instructed at his last OV at 3 pm and again at 8 pm... this morning he was still in Afib rate 150 so he took 400 mg of Amiodarone at 8 am.   As of 9 am this morning he is now back in NSR according to his Kardia and HR 78.   I will forward to Dr Nelly Laurence and also get some parameters on how much he should take when he feels he is in Afib.

## 2024-01-12 NOTE — Telephone Encounter (Signed)
Mealor, Roberts Gaudy, MD  You; Festus Holts, RN8 minutes ago (2:49 PM)   Let's have him continue on amiodarone 200mg  daily for the next 2 months    Pt advised.

## 2024-01-16 ENCOUNTER — Ambulatory Visit: Payer: Medicare Other | Admitting: Cardiovascular Disease

## 2024-01-29 ENCOUNTER — Telehealth: Payer: Self-pay | Admitting: Cardiovascular Disease

## 2024-01-29 NOTE — Telephone Encounter (Signed)
 Pt is requesting a callback regarding his upcoming appt and the reason for it. Please advise.

## 2024-01-29 NOTE — Telephone Encounter (Signed)
 Spoke with patient, we were holding an ablation spot in April for him but patient would like to move this out further due to no recurrence of AF. If no recurrence by the next appointment with Dr Nelly Laurence then he would like to cancel the ablation all together. Patient states he is not currently taking the amiodarone but has if for possible recurrence and the need to go back on it, still taking eliquis  At patient request: Pre-procedure appt moved from 3/13 to 5/15 Ablation moved from 4/15 to 6/9  No further needs at this time

## 2024-02-05 ENCOUNTER — Ambulatory Visit: Payer: Medicare Other | Admitting: Cardiovascular Disease

## 2024-02-26 ENCOUNTER — Other Ambulatory Visit: Payer: Self-pay | Admitting: Cardiology

## 2024-04-08 ENCOUNTER — Ambulatory Visit: Admitting: Cardiovascular Disease

## 2024-06-17 DIAGNOSIS — M545 Low back pain, unspecified: Secondary | ICD-10-CM | POA: Diagnosis not present

## 2024-06-17 DIAGNOSIS — M25551 Pain in right hip: Secondary | ICD-10-CM | POA: Diagnosis not present

## 2024-06-22 DIAGNOSIS — M4726 Other spondylosis with radiculopathy, lumbar region: Secondary | ICD-10-CM | POA: Diagnosis not present

## 2024-06-23 ENCOUNTER — Telehealth: Payer: Self-pay | Admitting: Cardiology

## 2024-06-23 NOTE — Telephone Encounter (Signed)
 Pt called into the office to report being in rapid afib since around 4 pm yesterday.   He states his rates have been consistently staying in the 150s.  He checked this with his [employer] while I was on the phone with him, and confirmed he was still in afib with a rate of 159 bpm.  Pt states he took amiodarone  around 4 pm yesterday and again at 1130 this morning.   Pt states he has not been taking his amiodarone  on a daily basis, just only as needed with AF reoccurrence.    Pt mentioned he was started on a prednisone taper for back pain about a week ago, and stopped taking this about 2 days ago, in fear this would exacerbate his afib.   Pt states he is pretty symptomatic today with complaints of sob, dizziness, increased fatigue, and palpitations/racing heart rate.  He denies pre-syncope or syncope.  Pt mentioned having a previous successful afib ablation back March of 2024.  He states he had another planned repeat ablation scheduled for this past March, but cancelled due to no reoccurrence of afib.   Advised the pt given his symptoms and rapid afib, he should refer to the ER now for further treatment.  Advised him to have his wife drive him there.  Did advise the pt to take all his medications with him to the ER, including his prednisone taper and amiodarone , so that the triage nurse can update this in his chart.   Pt is aware that I will share this plan with Dr. Nancey and his RN.   Pt verbalized understanding and agrees with this plan.

## 2024-06-23 NOTE — Telephone Encounter (Signed)
 Patient c/o Palpitations: STAT if patient c/o lightheadedness, shortness of breath, or chest pain  How long have you had palpitations/irregular HR/ Afib? Are you having the symptoms now? Been in Afib since 4pm yesterday and yes   Are you currently experiencing lightheadedness, SOB or CP? Lightheadedness and fatigue  Do you have a history of afib (atrial fibrillation) or irregular heart rhythm? Yes  Have you checked your BP or HR? (document readings if available): No  Are you experiencing any other symptoms? No.

## 2024-06-23 NOTE — Telephone Encounter (Signed)
 Patient called to report to RN Porter that he did not go to the ER and his HR has come down to 72 bpm.  Patient stated he has taken 3 Amiodarone , the last was 11:00 am today and he will not take anymore.  Patient noted his [employer] mobile shows he is no longer in afib.

## 2024-06-23 NOTE — Telephone Encounter (Signed)
 Will forward positive update from the pt to Dr. Nancey and covering RN as an RICK.

## 2024-06-28 NOTE — Telephone Encounter (Signed)
 Pt in NSR as of 06/23/24

## 2024-06-29 DIAGNOSIS — M4726 Other spondylosis with radiculopathy, lumbar region: Secondary | ICD-10-CM | POA: Diagnosis not present

## 2024-07-02 DIAGNOSIS — M4726 Other spondylosis with radiculopathy, lumbar region: Secondary | ICD-10-CM | POA: Diagnosis not present

## 2024-07-06 ENCOUNTER — Telehealth: Payer: Self-pay | Admitting: Cardiology

## 2024-07-06 NOTE — Telephone Encounter (Signed)
 Pt c/o Shortness Of Breath: STAT if SOB developed within the last 24 hours or pt is noticeably SOB on the phone  1. Are you currently SOB (can you hear that pt is SOB on the phone)? No   2. How long have you been experiencing SOB? Week   3. Are you SOB when sitting or when up moving around? Moving around   4. Are you currently experiencing any other symptoms? Fatigue

## 2024-07-06 NOTE — Telephone Encounter (Signed)
 Spoke with pt regarding his shortness of breath. Pt stated he has noticed the last couple of months that he has been in afib a couple times. Pt stated he has noticed he is becoming more short of breath with activity such as climbing the stairs than he normally gets. Pt stated he is feeling fatigued more often. Pt denied chest pain, sob at rest or lightheadedness. Pt has an appointment with Katlyn West, NP this week. Pt was told the information he provided would be sent to Dr. Jeffrie so that he is aware as well as Katlyn West, NP. A follow up appointment was made with [facility] for November. Pt verbalized understanding. All questions if any were answered.

## 2024-07-07 DIAGNOSIS — E7800 Pure hypercholesterolemia, unspecified: Secondary | ICD-10-CM | POA: Diagnosis not present

## 2024-07-07 DIAGNOSIS — R0609 Other forms of dyspnea: Secondary | ICD-10-CM | POA: Diagnosis not present

## 2024-07-07 DIAGNOSIS — M4726 Other spondylosis with radiculopathy, lumbar region: Secondary | ICD-10-CM | POA: Diagnosis not present

## 2024-07-07 DIAGNOSIS — I480 Paroxysmal atrial fibrillation: Secondary | ICD-10-CM | POA: Diagnosis not present

## 2024-07-11 NOTE — Progress Notes (Unsigned)
 Cardiology Office Note    Date:  07/12/2024  ID:  David Wright, DOB [birth year], MRN [id number] PCP:  Rexanne Ingle, MD  Cardiologist:  Oneil Parchment, MD  Electrophysiologist:  Eulas FORBES Furbish, MD   Chief Complaint: Follow up for atrial fibrillation   History of Present Illness: .   David Wright is a 78 y.o. male with visit-pertinent history of atrial fibrillation s/p A-fib ablation in March 2024, moderate coronary artery disease.  Patient has been followed by Dr. Parchment and Dr. Furbish.  CT cardiac scoring in 08/2022 indicated coronary calcium  score of 914, this was 72nd percentile for age, race and sex matched control.  Left heart catheterization in 10/2022 indicated nonobstructive CAD.  Patient underwent atrial fibrillation ablation with Dr. Furbish in March 2024.  Patient was last seen in clinic by Dr. Furbish on 11/10/2023, following his ablation he had noted intermittent episodes of A-fib with RVR repeat ablation was discussed and initially planned however Zulema this was canceled.  It was recommended that he resume amiodarone  if he had another recurrence of atrial fibrillation.  On chart review in 05/2024 patient reported episodes of atrial fibrillation with RVR.  Per Dr. Dianne responses appears that patient should have restarted amiodarone  daily and consider repeating ablation.  On 07/06/2024 patient notified the office of increased shortness of breath and reported that the last couple of months he had been in A-fib a few times.  He reported that he is becoming more short of breath with activity such as climbing the stairs than he would normally get and was feeling more fatigued.  Today he presents regarding increased episodes of atrial fibrillation.  Patient reports that in the last 6 months he has had at least 5 episodes of atrial fibrillation, heart rates will typically increase 250.  He notes his most recent episode was last Tuesday and lasted a little over 24 hours with highest heart  rate being in the 150s, reports on [employer] indicate this did decrease to around 100 bpm.  Patient notes that when he is in atrial fibrillation he has some slight chest discomfort and increased fatigue.  Patient reports that he is very aware when he goes into atrial fibrillation and when he converts out.  Patient also reports that in recent months he has noted increased fatigue and dyspnea with exertion, denies any significant chest discomfort with exertion however does not feel he is able to tolerate exercise as well as he previously has.   Labwork independently reviewed: 07/07/2024: TSH 0.90, creatinine 0.94, sodium 139, potassium 4.0, AST 14, ALT 14, hemoglobin 14.8, hematocrit 42.4 ROS: .   Today he denies lower extremity edema, melena, hematuria, hemoptysis, diaphoresis, weakness, presyncope, syncope, orthopnea, and PND.  All other systems are reviewed and otherwise negative. Studies Reviewed: SABRA   EKG:  EKG is ordered today, personally reviewed, demonstrating  EKG Interpretation Date/Time:  Monday July 12 2024 14:05:20 EDT Ventricular Rate:  59 PR Interval:  140 QRS Duration:  80 QT Interval:  440 QTC Calculation: 435 R Axis:   -11  Text Interpretation: Sinus bradycardia Inferior infarct , age undetermined Cannot rule out Anterior infarct , age undetermined T wave inversion in inferior leads Confirmed by Romari Gasparro [id number]) on 07/12/2024 4:53:28 PM   CV Studies: Cardiac studies reviewed are outlined and summarized above. Otherwise please see EMR for full report. Cardiac Studies & Procedures   ______________________________________________________________________________________________ CARDIAC CATHETERIZATION  CARDIAC CATHETERIZATION 11/14/2022  Conclusion   Ost LM lesion is 45% stenosed.  Prox LAD lesion is 30% stenosed.   Ost Cx to Mid Cx lesion is 20% stenosed.   Mid RCA lesion is 50% stenosed.   RPAV lesion is 50% stenosed.   The left ventricular systolic function is  normal.   LV end diastolic pressure is normal.   The left ventricular ejection fraction is 55-65% by visual estimate.  Nonobstructive CAD Normal LV function Normal LVEDP  Plan: risk factor modification  Findings Coronary Findings Diagnostic  Dominance: Right  Left Main Ost LM lesion is 45% stenosed. No dampening of pressure  Left Anterior Descending Prox LAD lesion is 30% stenosed. The lesion is calcified.  Left Circumflex Ost Cx to Mid Cx lesion is 20% stenosed.  Right Coronary Artery Mid RCA lesion is 50% stenosed.  Right Posterior Atrioventricular Artery RPAV lesion is 50% stenosed.  First Right Posterolateral Branch Vessel is small in size.  Second Right Posterolateral Branch Vessel is small in size.  Intervention  No interventions have been documented.   STRESS TESTS  MYOCARDIAL PERFUSION IMAGING 04/28/2017  Interpretation Summary  Nuclear stress EF: 73%.  Blood pressure demonstrated a normal response to exercise.  There was no ST segment deviation noted during stress.  The study is normal.  This is a low risk study.  The left ventricular ejection fraction is hyperdynamic (>65%).  Normal exercise nuclear stress test with no evidence for prior infarct or ischemia. Excellent exercise tolerance. Normal BP response to stress.   ECHOCARDIOGRAM  ECHOCARDIOGRAM COMPLETE 10/26/2021  Narrative ECHOCARDIOGRAM REPORT    Patient Name:   David Wright  Date of Exam: 10/26/2021 Medical Rec #:  [id number]     Height:       69.0 in Accession #:    [id number]    Weight:       161.0 lb Date of Birth:  [birth year]     BSA:          1.884 m Patient Age:    75 years      BP:           110/70 mmHg Patient Gender: M             HR:           75 bpm. Exam Location:  [address]  Procedure: 2D Echo, Cardiac Doppler and Color Doppler  Indications:    I48.91* Unspecified atrial fibrillation  History:        Patient has prior history of Echocardiogram  examinations, most recent 05/16/2016. Arrythmias:PAC; Risk Factors:Dyslipidemia. Asthma. Atrial tachycardia. Acute blood loss anemia.  Sonographer:    Jon Hacker RCS Referring Phys: [phone] MARK C SKAINS  IMPRESSIONS   1. Left ventricular ejection fraction, by estimation, is 55 to 60%. The left ventricle has normal function. The left ventricle has no regional wall motion abnormalities. Left ventricular diastolic parameters are consistent with Grade I diastolic dysfunction (impaired relaxation). 2. Right ventricular systolic function is normal. The right ventricular size is mildly enlarged. Tricuspid regurgitation signal is inadequate for assessing PA pressure. 3. Borderline bileaflet mitral valve prolapse. The mitral valve is grossly normal. Trivial mitral valve regurgitation. No evidence of mitral stenosis. 4. The aortic valve is grossly normal. There is mild calcification of the aortic valve. Aortic valve regurgitation is trivial. No aortic stenosis is present. 5. Pulmonic valve regurgitation is moderate. 6. The inferior vena cava is normal in size with greater than 50% respiratory variability, suggesting right atrial pressure of 3 mmHg.  FINDINGS Left Ventricle: Left ventricular ejection fraction, by  estimation, is 55 to 60%. The left ventricle has normal function. The left ventricle has no regional wall motion abnormalities. The left ventricular internal cavity size was normal in size. There is no left ventricular hypertrophy. Left ventricular diastolic parameters are consistent with Grade I diastolic dysfunction (impaired relaxation).  Right Ventricle: The right ventricular size is mildly enlarged. No increase in right ventricular wall thickness. Right ventricular systolic function is normal. Tricuspid regurgitation signal is inadequate for assessing PA pressure.  Left Atrium: Left atrial size was normal in size.  Right Atrium: Right atrial size was normal in size.  Pericardium:  There is no evidence of pericardial effusion.  Mitral Valve: Borderline bileaflet mitral valve prolapse. The mitral valve is grossly normal. Trivial mitral valve regurgitation. No evidence of mitral valve stenosis.  Tricuspid Valve: The tricuspid valve is normal in structure. Tricuspid valve regurgitation is trivial. No evidence of tricuspid stenosis.  Aortic Valve: The aortic valve is grossly normal. There is mild calcification of the aortic valve. Aortic valve regurgitation is trivial. No aortic stenosis is present.  Pulmonic Valve: The pulmonic valve was grossly normal. Pulmonic valve regurgitation is moderate. No evidence of pulmonic stenosis.  Aorta: The aortic root is normal in size and structure.  Venous: A normal flow pattern is recorded from the right lower pulmonary vein. The inferior vena cava is normal in size with greater than 50% respiratory variability, suggesting right atrial pressure of 3 mmHg.  IAS/Shunts: No atrial level shunt detected by color flow Doppler.   LEFT VENTRICLE PLAX 2D LVIDd:         4.00 cm   Diastology LV PW:         0.90 cm   LV e' medial:    7.72 cm/s LV IVS:        0.80 cm   LV E/e' medial:  9.1 LVOT diam:     2.15 cm   LV e' lateral:   9.03 cm/s LV SV:         83        LV E/e' lateral: 7.8 LV SV Index:   44 LVOT Area:     3.63 cm   RIGHT VENTRICLE RV Basal diam:  3.20 cm RV S prime:     19.90 cm/s TAPSE (M-mode): 2.6 cm  LEFT ATRIUM             Index        RIGHT ATRIUM           Index LA diam:        3.20 cm 1.70 cm/m   RA Area:     14.70 cm LA Vol (A2C):   48.0 ml 25.48 ml/m  RA Volume:   41.10 ml  21.81 ml/m LA Vol (A4C):   31.7 ml 16.83 ml/m LA Biplane Vol: 40.9 ml 21.71 ml/m AORTIC VALVE LVOT Vmax:   116.00 cm/s LVOT Vmean:  71.000 cm/s LVOT VTI:    0.229 m  AORTA Ao Root diam: 3.70 cm  MITRAL VALVE MV Area (PHT): 2.62 cm     SHUNTS MV Decel Time: 289 msec     Systemic VTI:  0.23 m MV E velocity: 70.20 cm/s    Systemic Diam: 2.15 cm MV A velocity: 115.00 cm/s MV E/A ratio:  0.61  Soyla Merck MD Electronically signed by Soyla Merck MD Signature Date/Time: 10/27/2021/12:40:22 PM    Final    MONITORS  CARDIAC EVENT MONITOR 05/02/2017  Narrative Sinus rhythm afib burden is 2% Ventricular  rates during afib are elevated Many symptomatic transmissions for palpitations are noted.  These often correspond to premature atrial contractions, premature ventricular contractions, or short nonsustained episodes of atrial tachycardia AV block or pauses are noted   CT SCANS  CT CARDIAC SCORING (SELF PAY ONLY) 09/20/2022  Addendum 09/23/2022  8:47 AM ADDENDUM REPORT: 09/23/2022 08:45  EXAM: OVER-READ INTERPRETATION  CT CHEST  The following report is an over-read performed by radiologist Dr. Marcey Diones [facility] on 09/23/2022. This over-read does not include interpretation of cardiac or coronary anatomy or pathology. The coronary calcium  score interpretation by the cardiologist is attached.  COMPARISON:  None.  FINDINGS: No significant noncardiac vascular findings. Visualized mediastinum and hilar regions demonstrate no lymphadenopathy or masses. Visualized lungs show no evidence of pulmonary edema, consolidation, pneumothorax or pleural fluid. 3 mm vague nodule in the anterior right middle lobe (image 21/2) is likely benign. The visualized liver is low in attenuation consistent with diffuse steatosis. Visualized bony structures are unremarkable.  IMPRESSION: 1. 3 mm right middle lobe pulmonary nodule. 3 mm right solid pulmonary nodule. Per Fleischner Society Guidelines, no routine follow-up imaging is recommended. These guidelines do not apply to immunocompromised patients and patients with cancer. Follow up in patients with significant comorbidities as clinically warranted. For lung cancer screening, adhere to Lung-RADS guidelines. Reference: Radiology.  2017; 284(1):228-43. 2. Hepatic steatosis.   Electronically Signed By: Marcey Moan M.D. On: 09/23/2022 08:45  Narrative CLINICAL DATA:  Cardiovascular Disease Risk stratification  EXAM: Coronary Calcium  Score  TECHNIQUE: A gated, non-contrast computed tomography scan of the heart was performed using 3mm slice thickness. Axial images were analyzed on a dedicated workstation. Calcium  scoring of the coronary arteries was performed using the Agatston method.  FINDINGS: Coronary Calcium  Score:  Left main: 13  Left anterior descending artery: 472  Left circumflex artery: 155  Right coronary artery: 273  Total: 914  Percentile: 72nd  Pericardium: Normal.  Non-cardiac: See separate report from [facility].  IMPRESSION: 1. Coronary calcium  score of 914. This was 72nd percentile for age-, race-, and sex-matched controls.  RECOMMENDATIONS: Coronary artery calcium  (CAC) score is a strong predictor of incident coronary heart disease (CHD) and provides predictive information beyond traditional risk factors. CAC scoring is reasonable to use in the decision to withhold, postpone, or initiate statin therapy in intermediate-risk or selected borderline-risk asymptomatic adults (age 43-75 years and LDL-C >=70 to <190 mg/dL) who do not have diabetes or established atherosclerotic cardiovascular disease (ASCVD).* In intermediate-risk (10-year ASCVD risk >=7.5% to <20%) adults or selected borderline-risk (10-year ASCVD risk >=5% to <7.5%) adults in whom a CAC score is measured for the purpose of making a treatment decision the following recommendations have been made:  If CAC=0, it is reasonable to withhold statin therapy and reassess in 5 to 10 years, as long as higher risk conditions are absent (diabetes mellitus, family history of premature CHD in first degree relatives (males <55 years; females <65 years), cigarette smoking, or LDL >=190 mg/dL).  If CAC is 1  to 99, it is reasonable to initiate statin therapy for patients >=29 years of age.  If CAC is >=100 or >=75th percentile, it is reasonable to initiate statin therapy at any age.  Cardiology referral should be considered for patients with CAC scores >=400 or >=75th percentile.  *2018 AHA/ACC/AACVPR/AAPA/ABC/ACPM/ADA/AGS/APhA/ASPC/NLA/PCNA Guideline on the Management of Blood Cholesterol: A Report of the American College of Cardiology/American Heart Association Task Force on Clinical Practice Guidelines. J Am Coll Cardiol. 2019;73(24):3168-3209.  David Decent, MD  Electronically Signed: By: David Wright M.D. On: 09/21/2022 11:56     ______________________________________________________________________________________________       Current Reported Medications:.    Current Meds  Medication Sig   amiodarone  (PACERONE ) 200 MG tablet Take 1 tablet (200 mg total) by mouth daily.   ELIQUIS  5 MG TABS tablet TAKE 1 TABLET TWICE A DAY   rosuvastatin  (CRESTOR ) 20 MG tablet TAKE 1 TABLET(20 MG) BY MOUTH DAILY   Physical Exam:    VS:  BP 110/60 (BP Location: Left Arm, Patient Position: Sitting, Cuff Size: Normal)   Pulse (!) 59   Ht 5' 9 (1.753 m)   Wt 164 lb (74.4 kg)   SpO2 95%   BMI 24.22 kg/m    Wt Readings from Last 3 Encounters:  07/12/24 164 lb (74.4 kg)  11/10/23 164 lb 3.2 oz (74.5 kg)  08/12/23 162 lb (73.5 kg)    GEN: Well nourished, well developed in no acute distress NECK: No JVD; No carotid bruits CARDIAC: RRR, no murmurs, rubs, gallops RESPIRATORY:  Clear to auscultation without rales, wheezing or rhonchi  ABDOMEN: Soft, non-tender, non-distended EXTREMITIES:  No edema; No acute deformity     Asessement and Plan:.    Persistent atrial fibrillation: s/p ablation in March 2024, unfortuantely has had recurrence of afib.  Patient reports that in the last 6 months he has had at least 5 episodes of atrial fibrillation, notes a few occasion when his heart rate  has increased to 155 bpm.  Notes most recent episode was on Tuesday and episode lasted a little over 24 hours.  He notes that when he is in atrial fibrillation he has some increased chest discomfort and overall feels fatigued.  He notes that he is very much aware of when he is in atrial fibrillation.  He will resume amiodarone  200 mg daily, reports adherence with Eliquis  5 mg twice daily.  He will follow-up with Dr. Nancey to discuss repeat ablation. Continue Eliquis  5 mg twice daily.   CAD/DOE/Fatigue: Diffuse moderate CAD on cath in 10/2022.  Today he reports increased dyspnea on exertion and increased fatigue in recent months, notes that he does not feel he is able to exert himself as much as he previously was able to.  He denies any significant chest pain although notes that when he is in atrial fibrillation he does have a slight chest discomfort.  Discussed nuclear stress test with patient, he is in agreement to proceed, he would like to try to do a treadmill test however is agreeable to Lexiscan if unable to achieve the needed METS.  Please see consent below.  Reviewed ED precautions.  Will also check echocardiogram given increased dyspnea on exertion and previous echo in 2022 indicating borderline bileaflet mitral valve prolapse. Informed Consent   Shared Decision Making/Informed Consent{ All outpatient stress tests require an informed consent ([id number]) ATTESTATION ORDER       :[id number]} The risks [chest pain, shortness of breath, cardiac arrhythmias, dizziness, blood pressure fluctuations, myocardial infarction, stroke/transient ischemic attack, nausea, vomiting, allergic reaction, radiation exposure, metallic taste sensation and life-threatening complications (estimated to be 1 in 10,000)], benefits (risk stratification, diagnosing coronary artery disease, treatment guidance) and alternatives of a nuclear stress test were discussed in detail with Mr. Nester and he agrees to proceed.      Hyperlipidemia: Last lipid profile on 07/07/24 indicated 07/07/2024: Cholesterol 158, triglycerides 223, HDL 52 and LDL 70.  Continue Crestor  20 mg daily.   Disposition: F/u with  Dr. Nancey regarding atrial fibrillation and Dr. Jeffrie in 09/2024 as scheduled.   Signed, Jamaurion Slemmer D Mardee Clune, NP

## 2024-07-12 ENCOUNTER — Ambulatory Visit: Attending: Cardiology | Admitting: Cardiology

## 2024-07-12 VITALS — BP 110/60 | HR 59 | Ht 69.0 in | Wt 164.0 lb

## 2024-07-12 DIAGNOSIS — I2510 Atherosclerotic heart disease of native coronary artery without angina pectoris: Secondary | ICD-10-CM | POA: Insufficient documentation

## 2024-07-12 DIAGNOSIS — Z8679 Personal history of other diseases of the circulatory system: Secondary | ICD-10-CM | POA: Insufficient documentation

## 2024-07-12 DIAGNOSIS — R0609 Other forms of dyspnea: Secondary | ICD-10-CM | POA: Diagnosis not present

## 2024-07-12 DIAGNOSIS — Z9889 Other specified postprocedural states: Secondary | ICD-10-CM | POA: Insufficient documentation

## 2024-07-12 DIAGNOSIS — D6869 Other thrombophilia: Secondary | ICD-10-CM | POA: Diagnosis not present

## 2024-07-12 DIAGNOSIS — E782 Mixed hyperlipidemia: Secondary | ICD-10-CM | POA: Diagnosis not present

## 2024-07-12 DIAGNOSIS — R5383 Other fatigue: Secondary | ICD-10-CM | POA: Diagnosis not present

## 2024-07-12 DIAGNOSIS — I480 Paroxysmal atrial fibrillation: Secondary | ICD-10-CM | POA: Diagnosis not present

## 2024-07-12 NOTE — Patient Instructions (Addendum)
 Medication Instructions:  Take Amiodarone  200 mg once a day  *If you need a refill on your cardiac medications before your next appointment, please call your pharmacy*  Lab Work: No labs  Testing/Procedures: Your physician has requested that you have an echocardiogram. Echocardiography is a painless test that uses sound waves to create images of your heart. It provides your doctor with information about the size and shape of your heart and how well your heart's chambers and valves are working. This procedure takes approximately one hour. There are no restrictions for this procedure. Please do NOT wear cologne, perfume, aftershave, or lotions (deodorant is allowed). Please arrive 15 minutes prior to your appointment time.  Please note: We ask at that you not bring children with you during ultrasound (echo/ vascular) testing. Due to room size and safety concerns, children are not allowed in the ultrasound rooms during exams. Our front office staff cannot provide observation of children in our lobby area while testing is being conducted. An adult accompanying a patient to their appointment will only be allowed in the ultrasound room at the discretion of the ultrasound technician under special circumstances. We apologize for any inconvenience.  Your physician has requested that you have an exercise stress myoview . For further information please visit [email or web address] Please follow instruction sheet, as given.  Follow-Up: At [facility], you and your health needs are our priority.  As part of our continuing mission to provide you with exceptional heart care, our providers are all part of one team.  This team includes your primary Cardiologist (physician) and Advanced Practice Providers or APPs (Physician Assistants and Nurse Practitioners) who all work together to provide you with the care you need, when you need it.  Your next appointment:   Keep appointment  Other appointment We are  going to need an appointment scheduled for Dr. Dwyane to discuss ablation  We recommend signing up for the patient portal called MyChart.  Sign up information is provided on this After Visit Summary.  MyChart is used to connect with patients for Virtual Visits (Telemedicine).  Patients are able to view lab/test results, encounter notes, upcoming appointments, etc.  Non-urgent messages can be sent to your provider as well.   To learn more about what you can do with MyChart, go to [email or web address].

## 2024-07-13 ENCOUNTER — Encounter: Payer: Self-pay | Admitting: Cardiology

## 2024-07-14 ENCOUNTER — Encounter (HOSPITAL_COMMUNITY): Payer: Self-pay | Admitting: *Deleted

## 2024-07-19 DIAGNOSIS — M545 Low back pain, unspecified: Secondary | ICD-10-CM | POA: Diagnosis not present

## 2024-07-21 ENCOUNTER — Other Ambulatory Visit: Payer: Self-pay | Admitting: Cardiology

## 2024-07-21 DIAGNOSIS — Z9889 Other specified postprocedural states: Secondary | ICD-10-CM

## 2024-07-21 DIAGNOSIS — D6869 Other thrombophilia: Secondary | ICD-10-CM

## 2024-07-21 DIAGNOSIS — R5383 Other fatigue: Secondary | ICD-10-CM

## 2024-07-21 DIAGNOSIS — R0609 Other forms of dyspnea: Secondary | ICD-10-CM

## 2024-07-21 DIAGNOSIS — I480 Paroxysmal atrial fibrillation: Secondary | ICD-10-CM

## 2024-07-21 DIAGNOSIS — I2510 Atherosclerotic heart disease of native coronary artery without angina pectoris: Secondary | ICD-10-CM

## 2024-07-22 ENCOUNTER — Ambulatory Visit (HOSPITAL_COMMUNITY)
Admission: RE | Admit: 2024-07-22 | Discharge: 2024-07-22 | Disposition: A | Discharge status: Home / Self Care | Source: Ambulatory Visit | Attending: Cardiology | Admitting: Cardiology

## 2024-07-22 DIAGNOSIS — I480 Paroxysmal atrial fibrillation: Secondary | ICD-10-CM | POA: Insufficient documentation

## 2024-07-22 DIAGNOSIS — I2510 Atherosclerotic heart disease of native coronary artery without angina pectoris: Secondary | ICD-10-CM | POA: Insufficient documentation

## 2024-07-22 DIAGNOSIS — Z8679 Personal history of other diseases of the circulatory system: Secondary | ICD-10-CM | POA: Insufficient documentation

## 2024-07-22 DIAGNOSIS — Z9889 Other specified postprocedural states: Secondary | ICD-10-CM | POA: Insufficient documentation

## 2024-07-22 DIAGNOSIS — D6869 Other thrombophilia: Secondary | ICD-10-CM | POA: Insufficient documentation

## 2024-07-22 DIAGNOSIS — R5383 Other fatigue: Secondary | ICD-10-CM | POA: Insufficient documentation

## 2024-07-22 DIAGNOSIS — R0609 Other forms of dyspnea: Secondary | ICD-10-CM | POA: Insufficient documentation

## 2024-07-22 LAB — MYOCARDIAL PERFUSION IMAGING
Duke Treadmill Score: 6
ST Depression (mm): 0 mm

## 2024-07-22 MED ORDER — TECHNETIUM TC 99M TETROFOSMIN IV KIT
31.0000 | PACK | Freq: Once | INTRAVENOUS | Status: AC | PRN
  Administered 2024-07-22: 31 via INTRAVENOUS

## 2024-07-22 MED ORDER — TECHNETIUM TC 99M TETROFOSMIN IV KIT
10.6000 | PACK | Freq: Once | INTRAVENOUS | Status: AC | PRN
  Administered 2024-07-22: 10.6 via INTRAVENOUS

## 2024-07-22 MED ORDER — TECHNETIUM TC 99M TETROFOSMIN IV KIT: 10.6000 | PACK | Freq: Once | INTRAVENOUS | Status: DC | PRN

## 2024-07-23 ENCOUNTER — Ambulatory Visit: Payer: Self-pay | Admitting: Cardiology

## 2024-07-23 LAB — MYOCARDIAL PERFUSION IMAGING
Angina Index: 0
Estimated workload: 7
Exercise duration (min): 6 min
Exercise duration (sec): 0 s
LV dias vol: 66 mL (ref 62–150)
LV sys vol: 22 mL (ref 4.2–5.8)
MPHR: 140 {beats}/min
Nuc Stress EF: 67 %
Peak HR: 129 {beats}/min
Percent HR: 92 %
RPE: 18
Rest HR: 65 {beats}/min
Rest Nuclear Isotope Dose: 10.6 mCi
SDS: 0
SRS: 3
SSS: 0
Stress Nuclear Isotope Dose: 31 mCi
TID: 0.98

## 2024-07-28 ENCOUNTER — Ambulatory Visit: Admitting: Cardiovascular Disease

## 2024-07-30 NOTE — Telephone Encounter (Signed)
 From 07/13/24 OV note with Katlyn West, NP:  Persistent atrial fibrillation: s/p ablation in March 2024, unfortuantely has had recurrence of afib.  Patient reports that in the last 6 months he has had at least 5 episodes of atrial fibrillation, notes a few occasion when his heart rate has increased to 155 bpm.  Notes most recent episode was on Tuesday and episode lasted a little over 24 hours.  He notes that when he is in atrial fibrillation he has some increased chest discomfort and overall feels fatigued.  He notes that he is very much aware of when he is in atrial fibrillation.  He will resume amiodarone  200 mg daily, reports adherence with Eliquis  5 mg twice daily.  He will follow-up with Dr. Nancey to discuss repeat ablation. Continue Eliquis  5 mg twice daily.    CAD/DOE/Fatigue: Diffuse moderate CAD on cath in 10/2022.  Today he reports increased dyspnea on exertion and increased fatigue in recent months, notes that he does not feel he is able to exert himself as much as he previously was able to.  He denies any significant chest pain although notes that when he is in atrial fibrillation he does have a slight chest discomfort.  Discussed nuclear stress test with patient, he is in agreement to proceed, he would like to try to do a treadmill test however is agreeable to Lexiscan if unable to achieve the needed METS.  Please see consent below.  Reviewed ED precautions.  Will also check echocardiogram given increased dyspnea on exertion and previous echo in 2022 indicating borderline bileaflet mitral valve prolapse. Informed Consent Shared Decision Making/Informed Consent The risks [chest pain, shortness of breath, cardiac arrhythmias, dizziness, blood pressure fluctuations, myocardial infarction, stroke/transient ischemic attack, nausea, vomiting, allergic reaction, radiation exposure, metallic taste sensation and life-threatening complications (estimated to be 1 in 10,000)], benefits (risk stratification,  diagnosing coronary artery disease, treatment guidance) and alternatives of a nuclear stress test were discussed in detail with Mr. Conran and he agrees to proceed.      Hyperlipidemia: Last lipid profile on 07/07/24 indicated 07/07/2024: Cholesterol 158, triglycerides 223, HDL 52 and LDL 70.  Continue Crestor  20 mg daily. Disposition: F/u with Dr. Nancey regarding atrial fibrillation and Dr. Jeffrie in 09/2024 as scheduled.    Signed, Katlyn D West, NP    Pt had myoview  stress test 07/22/2024 and is schedule for echo in September.     Katlyn D West, NP 07/23/2024  5:27 PM EDT     Please let Mr. Kennard know that his stress test was normal. There was no evidence of decreased blood flow to the heart. There was a small area that appeared abnormal, this could indicate a small area of scarring however it appears more consistent with artifact as a result of movement of the diaphragm. Overall good result! Continue with echocardiogram as planned.    He scheduled with Dr Nancey in September as well with f/u in November with Dr Jeffrie.

## 2024-08-12 ENCOUNTER — Ambulatory Visit (HOSPITAL_COMMUNITY)
Admission: RE | Admit: 2024-08-12 | Discharge: 2024-08-12 | Disposition: A | Discharge status: Home / Self Care | Source: Ambulatory Visit | Attending: Internal Medicine | Admitting: Internal Medicine

## 2024-08-12 DIAGNOSIS — R0609 Other forms of dyspnea: Secondary | ICD-10-CM | POA: Diagnosis not present

## 2024-08-12 DIAGNOSIS — D6869 Other thrombophilia: Secondary | ICD-10-CM | POA: Diagnosis not present

## 2024-08-12 DIAGNOSIS — R5383 Other fatigue: Secondary | ICD-10-CM | POA: Insufficient documentation

## 2024-08-12 DIAGNOSIS — I480 Paroxysmal atrial fibrillation: Secondary | ICD-10-CM | POA: Diagnosis not present

## 2024-08-12 DIAGNOSIS — Z8679 Personal history of other diseases of the circulatory system: Secondary | ICD-10-CM | POA: Diagnosis not present

## 2024-08-12 DIAGNOSIS — I2510 Atherosclerotic heart disease of native coronary artery without angina pectoris: Secondary | ICD-10-CM | POA: Diagnosis not present

## 2024-08-12 DIAGNOSIS — Z9889 Other specified postprocedural states: Secondary | ICD-10-CM | POA: Insufficient documentation

## 2024-08-12 LAB — ECHOCARDIOGRAM COMPLETE
Area-P 1/2: 2.87 cm2
S' Lateral: 2.7 cm

## 2024-08-20 ENCOUNTER — Ambulatory Visit: Attending: Cardiovascular Disease | Admitting: Cardiovascular Disease

## 2024-08-20 ENCOUNTER — Other Ambulatory Visit: Payer: Self-pay | Admitting: Cardiovascular Disease

## 2024-08-20 ENCOUNTER — Encounter: Payer: Self-pay | Admitting: Cardiovascular Disease

## 2024-08-20 VITALS — BP 106/60 | HR 59 | Ht 69.0 in | Wt 163.4 lb

## 2024-08-20 DIAGNOSIS — I480 Paroxysmal atrial fibrillation: Secondary | ICD-10-CM | POA: Insufficient documentation

## 2024-08-20 MED ORDER — METOPROLOL SUCCINATE ER 25 MG PO TB24: 25.0000 mg | ORAL_TABLET | Freq: Every day | ORAL | 1 refills | Status: DC | PRN

## 2024-08-20 NOTE — Patient Instructions (Addendum)
 Cardiac Ablation scheduled for October 28, 2024 at 7:30 am.  David Wright will arrive to [name] hospital at 5:30 am.  Medication Instructions:  Your physician recommends that you continue on your current medications as directed. Please refer to the Current Medication list given to you today.  *If you need a refill on your cardiac medications before your next appointment, please call your pharmacy*  Lab Work: You will get lab work prior to your cardiac CT/ablation.  If you have labs (blood work) drawn today and your tests are completely normal, you will receive your results only by: MyChart Message (if you have MyChart) OR A paper copy in the mail If you have any lab test that is abnormal or we need to change your treatment, we will call you to review the results.  Testing/Procedures: You will get a cardiac CT prior to your ablation.  Your physician has recommended that you have an ablation. Catheter ablation is a medical procedure used to treat some cardiac arrhythmias (irregular heartbeats). During catheter ablation, a long, thin, flexible tube is put into a blood vessel in your groin (upper thigh), or neck. This tube is called an ablation catheter. It is then guided to your heart through the blood vessel. Radio frequency waves destroy small areas of heart tissue where abnormal heartbeats may cause an arrhythmia to start. Please see the instruction sheet given to you today.  Follow-Up: At [facility], you and your health needs are our priority.  As part of our continuing mission to provide you with exceptional heart care, our providers are all part of one team.  This team includes your primary Cardiologist (physician) and Advanced Practice Providers or APPs (Physician Assistants and Nurse Practitioners) who all work together to provide you with the care you need, when you need it.  Your next appointment:   Your post ablation follow up appointments will be scheduled for you.  We recommend  signing up for the patient portal called MyChart.  Sign up information is provided on this After Visit Summary.  MyChart is used to connect with patients for Virtual Visits (Telemedicine).  Patients are able to view lab/test results, encounter notes, upcoming appointments, etc.  Non-urgent messages can be sent to your provider as well.   To learn more about what you can do with MyChart, go to [email or web address].   Cardiac Ablation Cardiac ablation is a procedure to destroy, or ablate, a small amount of heart tissue that is causing problems. The heart has many electrical connections. Sometimes, these connections are abnormal and can cause the heart to beat very fast or irregularly. Ablating the abnormal areas can improve the heart's rhythm or return it to normal. Ablation may be done for people who: Have irregular or rapid heartbeats (arrhythmias). Have Wolff-Parkinson-White syndrome. Have taken medicines for an arrhythmia that did not work or caused side effects. Have a high-risk heartbeat that may be life-threatening. Tell a health care provider about: Any allergies you have. All medicines you are taking, including vitamins, herbs, eye drops, creams, and over-the-counter medicines. Any problems you or family members have had with anesthesia. Any bleeding problems you have. Any surgeries you have had. Any medical conditions you have. Whether you are pregnant or may be pregnant. What are the risks? Your health care provider will talk with you about risks. These may include: Infection. Bruising and bleeding. Stroke or blood clots. Damage to nearby structures or organs. Allergic reaction to medicines or dyes. Needing a pacemaker if the heart gets damaged.  A pacemaker is a device that helps the heart beat normally. Failure of the procedure. A repeat procedure may be needed. What happens before the procedure? Medicines Ask your health care provider about: Changing or stopping your  regular medicines. These include any heart rhythm medicines, diabetes medicines, or blood thinners you take. Taking medicines such as aspirin  and ibuprofen. These medicines can thin your blood. Do not take them unless your health care provider tells you to. Taking over-the-counter medicines, vitamins, herbs, and supplements. General instructions Follow instructions from your health care provider about what you may eat and drink. If you will be going home right after the procedure, plan to have a responsible adult: Take you home from the hospital or clinic. You will not be allowed to drive. Care for you for the time you are told. Ask your health care provider what steps will be taken to prevent infection. What happens during the procedure?  An IV will be inserted into one of your veins. You may be given: A sedative. This helps you relax. Anesthesia. This will: Numb certain areas of your body. An incision will be made in your neck or your groin. A needle will be inserted through the incision and into a large vein in your neck or groin. The small, thin tube (catheter) will be inserted through the needle and moved to your heart. A type of X-ray (fluoroscopy) will be used to help guide the catheter and provide images of the heart on a monitor. Dye may be injected through the catheter to help your surgeon see the area of the heart that needs treatment. Electrical currents will be sent from the catheter to destroy heart tissue in certain areas. There are three types of energy that may be used to do this: Heat (radiofrequency energy). Laser energy. Extreme cold (cryoablation). When the tissue has been destroyed, the catheter will be removed. Pressure will be held on the insertion area to prevent bleeding. A bandage (dressing) will be placed over the insertion area. The procedure may vary among health care providers and hospitals. What happens after the procedure? Your blood pressure, heart rate  and rhythm, breathing rate, and blood oxygen level will be monitored until you leave the hospital or clinic. Your insertion area will be checked for bleeding. You will need to lie still for a few hours. If your groin was used, you will need to keep your leg straight for a few hours after the catheter is removed. This information is not intended to replace advice given to you by your health care provider. Make sure you discuss any questions you have with your health care provider. Document Revised: 04/30/2022 Document Reviewed: 04/30/2022 Elsevier Patient Education  2024 [employer].

## 2024-08-20 NOTE — Progress Notes (Signed)
 Electrophysiology Office Note:    Date:  08/20/2024   ID:  David Wright, DOB 1946-08-11, MRN [id number]  PCP:  Rexanne Ingle, MD   [facility] Providers Cardiologist:  Oneil Parchment, MD Electrophysiologist:  Eulas FORBES Furbish, MD     Referring MD: Rexanne Ingle, MD   Chief complaint: PAF  History of Present Illness:    David Wright is a 78 y.o. male with a hx of HLD, AF referred for arrhythmia management.  He has symptomatic paroxysmal AF. His burden has historically been very low, a few episodes a year. He has been managed with flecainide , metoprolol , and apixaban .   He has been having a few more episodes than usual recently. He will often take an extra tablet of flecainide . The episodes may last a few minutes or hours. He knows immediately when he has AF and becomes more fatigued and short of breath.  Since our last visit, he has been doing quite well. He had a coronary angiogram that did not show any clinically significant obstruction.  Because he had recurrence on flecainide , it was discontinued.  Amiodarone  was started, and he has been doing very well on it without any symptoms of AF recurrence.  He underwent uncomplicated atrial fibrillation ablation on February 12, 2023.  Prior to ablation, his CT showed an elevated CAC of 914.  Subsequent left heart cath showed moderate coronary disease; PCI was not indicated.  Since the ablation, he reports that he has not had any symptoms of A-fib recurrence.  He continues to take amiodarone  100 mg daily.  Heart rates at home have been in the 50s.  On September 12 he called into the office complaining of awakening with heart rates of 140 bpm.  He resumed amiodarone  and was seen the same day in clinic.  Atrial fibrillation had resolved at that point and the EKG showed sinus rhythm.  I reviewed tracings from that morning that show atrial fibrillation with high ventricular rates.  He remains off of amiodarone  and has not had another  recurrence of atrial fibrillation.  EKGs/Labs/Other Studies Reviewed Today:     CT cardiac calcium  score: 09/23/2022 914, 72 %ile. (Started rosuvastatin )  EKG:  Last EKG results: today - see Epic   Recent Labs: No results found for requested labs within last 365 days.     Physical Exam:    VS:  BP 106/60   Pulse (!) 59   Ht 5' 9 (1.753 m)   Wt 163 lb 6.4 oz (74.1 kg)   SpO2 96%   BMI 24.13 kg/m     Wt Readings from Last 3 Encounters:  08/20/24 163 lb 6.4 oz (74.1 kg)  07/12/24 164 lb (74.4 kg)  11/10/23 164 lb 3.2 oz (74.5 kg)     GEN: Well nourished, well developed in no acute distress CARDIAC: RRR, no murmurs, rubs, gallops RESPIRATORY:  Normal work of breathing MUSCULOSKELETAL: no edema    ASSESSMENT & PLAN:    Paroxysmal atrial fibrillation:  symptomatic.  Failed flecainide  - had recurrence.  He has had multiple episodes of A-fib recurrence in the past year We discussed management options and using a shared decision making approach decided to proceed with repeat ablation. Continue amiodarone  until his ablation Will plan to use carto for mapping of prior ablation Will obtain CT  We discussed the indication, rationale, logistics, anticipated benefits, and potential risks of the ablation procedure including but not limited to -- bleed at the groin access site, chest pain, damage to nearby  organs such as the diaphragm, lungs, or esophagus, need for a drainage tube, or prolonged hospitalization. I explained that the risk for stroke, heart attack, need for open chest surgery, or even death is very low but not zero. he  expressed understanding and wishes to proceed.   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  Concerns regarding  medicines are outlined above.  Orders Placed This Encounter  Procedures   EKG 12-Lead   No orders of the defined types were placed in this encounter.    Signed, Eulas FORBES Furbish, MD  08/20/2024 12:11 PM    [facility]

## 2024-09-08 DIAGNOSIS — R0600 Dyspnea, unspecified: Secondary | ICD-10-CM | POA: Diagnosis not present

## 2024-09-08 DIAGNOSIS — R5383 Other fatigue: Secondary | ICD-10-CM | POA: Diagnosis not present

## 2024-09-15 DIAGNOSIS — R0789 Other chest pain: Secondary | ICD-10-CM | POA: Diagnosis not present

## 2024-09-23 DIAGNOSIS — N401 Enlarged prostate with lower urinary tract symptoms: Secondary | ICD-10-CM | POA: Diagnosis not present

## 2024-09-23 DIAGNOSIS — R7989 Other specified abnormal findings of blood chemistry: Secondary | ICD-10-CM | POA: Diagnosis not present

## 2024-09-27 DIAGNOSIS — R7989 Other specified abnormal findings of blood chemistry: Secondary | ICD-10-CM | POA: Diagnosis not present

## 2024-09-27 DIAGNOSIS — R319 Hematuria, unspecified: Secondary | ICD-10-CM | POA: Diagnosis not present

## 2024-10-01 ENCOUNTER — Encounter (HOSPITAL_COMMUNITY): Payer: Self-pay

## 2024-10-04 DIAGNOSIS — I480 Paroxysmal atrial fibrillation: Secondary | ICD-10-CM | POA: Diagnosis not present

## 2024-10-04 DIAGNOSIS — R319 Hematuria, unspecified: Secondary | ICD-10-CM | POA: Diagnosis not present

## 2024-10-05 LAB — BASIC METABOLIC PANEL WITH GFR
BUN/Creatinine Ratio: 15 (ref 10–24)
BUN: 16 mg/dL (ref 8–27)
CO2: 23 mmol/L (ref 20–29)
Calcium: 9 mg/dL (ref 8.6–10.2)
Chloride: 102 mmol/L (ref 96–106)
Creatinine, Ser: 1.05 mg/dL (ref 0.76–1.27)
Glucose: 88 mg/dL (ref 70–99)
Potassium: 4.4 mmol/L (ref 3.5–5.2)
Sodium: 138 mmol/L (ref 134–144)
eGFR: 72 mL/min/1.73 (ref >59)

## 2024-10-05 LAB — CBC
Hematocrit: 43.8 % (ref 37.5–51.0)
Hemoglobin: 13.9 g/dL (ref 13.0–17.7)
MCH: 30.8 pg (ref 26.6–33.0)
MCHC: 31.7 g/dL (ref 31.5–35.7)
MCV: 97 fL (ref 79–97)
Platelets: 274 x10E3/uL (ref 150–450)
RBC: 4.52 x10E6/uL (ref 4.14–5.80)
RDW: 12.4 % (ref 11.6–15.4)
WBC: 7.1 x10E3/uL (ref 3.4–10.8)

## 2024-10-06 ENCOUNTER — Ambulatory Visit: Payer: Self-pay | Admitting: Cardiovascular Disease

## 2024-10-06 ENCOUNTER — Telehealth (HOSPITAL_COMMUNITY): Payer: Self-pay

## 2024-10-06 ENCOUNTER — Ambulatory Visit: Admitting: Cardiology

## 2024-10-06 ENCOUNTER — Encounter (HOSPITAL_COMMUNITY): Payer: Self-pay

## 2024-10-06 NOTE — Telephone Encounter (Signed)
 Spoke with patient to complete pre-procedure call.     Health status review:  Any new medical conditions, recent signs of acute illness or been started on antibiotics? No Any recent hospitalizations or surgeries? No Any new medications started since pre-op visit? No  Follow all medication instructions prior to procedure or the procedure may be rescheduled:    Continue taking Eliquis  (Apixaban ) twice daily without missing any doses before procedure. Essential chronic medications:  No medication should be continued, unless told otherwise. On the morning of your procedure DO NOT take any medication., including Eliquis  (Apixaban ).  Nothing to eat or drink after midnight prior to your procedure.  Pre-procedure testing scheduled: CT on November 13 and lab work completed.  Confirmed patient is scheduled for Atrial Fibrillation Ablation on Thursday, December 4 with Dr. Nancey. Instructed patient to arrive at the Main Entrance A at [facility]: [address] [place], [place] [address] and check in at Admitting at 5:30 AM.  Plan to go home the same day, you will only stay overnight if medically necessary. You MUST have a responsible adult to drive you home and MUST be with you the first 24 hours after you arrive home or your procedure could be cancelled.  Informed a nurse may call a day before the procedure to confirm arrival time and ensure instructions are followed.  Patient verbalized understanding to information provided and is agreeable to proceed with procedure.   Advised to contact RN Navigator at [phone], to inform of any new medications started after call or concerns prior to procedure.

## 2024-10-07 ENCOUNTER — Ambulatory Visit (HOSPITAL_COMMUNITY)
Admission: RE | Admit: 2024-10-07 | Discharge: 2024-10-07 | Disposition: A | Discharge status: Home / Self Care | Source: Ambulatory Visit | Attending: Cardiology | Admitting: Cardiology

## 2024-10-07 DIAGNOSIS — I480 Paroxysmal atrial fibrillation: Secondary | ICD-10-CM | POA: Diagnosis not present

## 2024-10-07 MED ORDER — IOHEXOL 350 MG/ML SOLN
95.0000 mL | Freq: Once | INTRAVENOUS | Status: AC | PRN
  Administered 2024-10-07: 95 mL via INTRAVENOUS

## 2024-10-12 ENCOUNTER — Telehealth: Payer: Self-pay | Admitting: Cardiovascular Disease

## 2024-10-12 DIAGNOSIS — R911 Solitary pulmonary nodule: Secondary | ICD-10-CM

## 2024-10-12 NOTE — Telephone Encounter (Signed)
 Spoke with patient. Radiology overread suggested chest CT in 3 months (see below). He is asking who will do this follow up order and if these findings will impact his upcoming procedure at all.   IMPRESSION: Interval increase in size of a left lower lobe subpleural nodule measuring 7 mm (from 4 mm). Underlying malignancy not excluded. Recommend repeat CT chest in 3 months. Consider pulmonology consultation. Other subpleural nodules bilaterally are stable in size.     Electronically Signed   By: Morgane  Naveau M.D.   On: 10/10/2024 21:34

## 2024-10-12 NOTE — Telephone Encounter (Signed)
 Pt requesting a c/b to ask questions about his CT scan.

## 2024-10-14 NOTE — Telephone Encounter (Signed)
 Spoke with pt and advised of Dr Marko recommendation to be followed by pulmonary nodule clinic.  Pt verbalized understanding and agrees with current plan. Referral placed.

## 2024-10-19 NOTE — Telephone Encounter (Signed)
 Received phone call from pt stating his appointment with pulmonary has been scheduled for 11/11/2024 for further evaluation of pulmonary nodule.  Pt advised of Dr Marko note below and ablation will be canceled.   Cardiac CT reviewed. No LAA thrombus.   Nodule noted, increased in size. Patient has been referred to pulmonary nodule clinic. We will need to receive a response from the pulmonary clinic prior to proceeding with ablation. The issue will be that any possible procedures (biopsies, surgeries) would need to be performed an ablation due to the necessity for 3 months of uninterrupted anticoagulation after.

## 2024-10-28 ENCOUNTER — Encounter (HOSPITAL_COMMUNITY): Admission: RE | Payer: Self-pay | Source: Home / Self Care

## 2024-10-28 ENCOUNTER — Ambulatory Visit (HOSPITAL_COMMUNITY): Admission: RE | Admit: 2024-10-28 | Source: Home / Self Care | Admitting: Cardiovascular Disease

## 2024-10-28 SURGERY — ATRIAL FIBRILLATION ABLATION
Anesthesia: General

## 2024-11-01 DIAGNOSIS — M25561 Pain in right knee: Secondary | ICD-10-CM | POA: Diagnosis not present

## 2024-11-03 ENCOUNTER — Encounter: Payer: Self-pay | Admitting: Emergency Medicine

## 2024-11-03 ENCOUNTER — Ambulatory Visit (INDEPENDENT_AMBULATORY_CARE_PROVIDER_SITE_OTHER): Admitting: Emergency Medicine

## 2024-11-03 VITALS — BP 104/60 | HR 72 | Ht 69.0 in | Wt 164.0 lb

## 2024-11-03 DIAGNOSIS — J4520 Mild intermittent asthma, uncomplicated: Secondary | ICD-10-CM | POA: Diagnosis not present

## 2024-11-03 DIAGNOSIS — J309 Allergic rhinitis, unspecified: Secondary | ICD-10-CM | POA: Diagnosis not present

## 2024-11-03 DIAGNOSIS — R918 Other nonspecific abnormal finding of lung field: Secondary | ICD-10-CM

## 2024-11-03 DIAGNOSIS — I4891 Unspecified atrial fibrillation: Secondary | ICD-10-CM | POA: Diagnosis not present

## 2024-11-03 MED ORDER — FLUTICASONE PROPIONATE 50 MCG/ACT NA SUSP: 2.0000 | Freq: Every day | NASAL | 2 refills | Status: AC

## 2024-11-03 NOTE — Patient Instructions (Addendum)
°  VISIT SUMMARY: You came in today to discuss your pulmonary nodules and other respiratory concerns. We reviewed your history of atrial fibrillation, asthma, and allergies, and discussed your recent symptoms and family history of lung cancer.  YOUR PLAN: -PULMONARY NODULES: Pulmonary nodules are small growths in the lungs. Your recent CT scan showed that one of the nodules in your left lower lobe has increased in size from 4 mm to 7 mm. Although the risk of cancer is low, we need to monitor these nodules closely. We have ordered a repeat chest CT for mid-February to check the size and stability of the nodules. We will review the results to decide on the next steps.  -ALLERGIC RHINITIS: Allergic rhinitis is inflammation of the nasal passages caused by allergies. This is likely causing your chronic nasal congestion and morning phlegm. I recommend using over-the-counter antihistamine like loratadine or fluticasone  nasal spray to manage your symptoms. You can also use a saline nasal spray to keep your nasal passages moist and clean.  -ASTHMA: Asthma is a condition where your airways narrow and swell, making it hard to breathe. Although you haven't had recent asthma attacks, you have noted some shortness of breath with exertion. We have ordered pulmonary function tests to assess your lung capacity and airflow. Once we have the results, we will determine if you need any treatment for your asthma.  -ATRIAL FIBRILLATION: There is no barrier to proceeding with your atrial fibrillation ablation while we follow your pulmonary nodules with serial imaging.  Would recommend proceeding as planned by cardiology if they believe the benefits outweigh any associated risk.  INSTRUCTIONS: Please schedule a repeat chest CT for mid-February to monitor your pulmonary nodules. Additionally, we have ordered pulmonary function tests to evaluate your asthma. Follow up with us  after these tests so we can review the results and plan  the next steps.

## 2024-11-03 NOTE — Progress Notes (Signed)
 Subjective:    Patient ID: David Wright, male    DOB: 1946-10-14, 78 y.o.   MRN: [id number]  HPI Discussed the use of AI scribe software for clinical note transcription with the patient, who gave verbal consent to proceed.  History of Present Illness David Wright is an 78 year old male with atrial fibrillation and pulmonary nodules who presents for evaluation of pulmonary nodules.  He has a history of atrial fibrillation that has been refractory to multiple treatments, including Dofetilide and Amiodarone , and he has undergone ablation. A cardiac calcium  scoring CT on September 20, 2022, revealed small pulmonary nodules. A repeat cardiac venous mapping CT on February 05, 2023, showed stability of these nodules, including a 5 mm right lower lobe subpleural nodule, a 3 mm right middle lobe nodule, and paired 4 mm nodules in the lateral basal left lower lobe.  He has been coughing up thick, clear phlegm in the mornings for the past 8-9 months, which he attributes to the heat in the house. No recent upper respiratory infections, but he has a history of congestion, asthma and allergies, previously treated with Advair, Flonase , and allergy shots. He has not had an asthma attack in years and is currently off all asthma medications. He used Afrin for three days about two to three weeks ago due to severe nasal blockage.  His family history is significant for lung cancer in his father, who was a lifelong smoker. 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 He has no known history of asbestos exposure or tuberculosis. He has always had dogs but no birds or chickens.  He experiences occasional pain when taking deep breaths or turning, which he describes as mild and inconsistent.  Also experiences some exertional shortness of breath.  He has a history of asthma diagnosed 35-40 years ago and underwent allergy shots for 20 years. He moved from [place]  to a warmer  climate, which initially alleviated his symptoms.  I personally reviewed and interpreted the patient's CT scans of the chest done 08/2022, 01/2023, 09/2024.  Results RADIOLOGY Cardiac calcium  scoring CT chest: Small pulmonary nodules (09/20/2022) Cardiac venous mapping CT: Stable pulmonary nodules including 5 mm right lower lobe subpleural nodule, 3 mm right middle lobe nodule, paired 4 mm nodules in lateral basal left lower lobe (02/05/2023) Cardiac venous mapping CT: Stability of most nodules; interval increase in left lower lobe subpleural nodule from 4 mm to 7 mm (10/07/2024)    Review of Systems As per HPI  Past Medical History:  Diagnosis Date   Anginal pain    has been worked-up but all neg.   Arthritis    Asthma    well controlled   Atrial tachycardia, paroxysmal    Complication of anesthesia 40 yrs.ago   spinal headache   Dysrhythmia    A-Fibrillation   Headache    Hypercholesteremia    pt reports that his cholesterol is normal   PAC (premature atrial contraction)    Paroxysmal atrial fibrillation (HCC)    Snores    Spinal headache    40 years ago    Family History  Problem Relation Age of Onset   Heart attack Mother    Heart disease Mother    Heart attack Father    Heart disease Father    Pancreatic cancer Sister      Social History   Socioeconomic History   Marital status: Married    Spouse name: Not  on file   Number of children: Not on file   Years of education: Not on file   Highest education level: Not on file  Occupational History   Not on file  Tobacco Use   Smoking status: Never    Passive exposure: Past   Smokeless tobacco: Never  Vaping Use   Vaping status: Never Used  Substance and Sexual Activity   Alcohol use: Yes    Alcohol/week: 0.0 standard drinks of alcohol    Comment: occasionally a drink per week or less   Drug use: No   Sexual activity: Not on file  Other Topics Concern   Not on file  Social History Narrative   Pt lives  in [place] with spouse.   Retired corporate investment banker for [employer]   Social Drivers of Corporate Investment Banker Strain: Not on [employer] Insecurity: Not on file  Transportation Needs: Not on file  Physical Activity: Not on file  Stress: Not on file  Social Connections: Not on file  Intimate Partner Violence: Not on file    No Known Allergies  Current Outpatient Medications on File Prior to Visit  Medication Sig Dispense Refill   amiodarone  (PACERONE ) 200 MG tablet Take 1 tablet (200 mg total) by mouth daily.     ELIQUIS  5 MG TABS tablet TAKE 1 TABLET TWICE A DAY 180 tablet 3   rosuvastatin  (CRESTOR ) 20 MG tablet TAKE 1 TABLET(20 MG) BY MOUTH DAILY 90 tablet 2   tamsulosin (FLOMAX) 0.4 MG CAPS capsule Take 0.4 mg by mouth daily.     No current facility-administered medications on file prior to visit.       Objective:    Vitals:   11/03/24 0831  BP: 104/60  Pulse: 72  SpO2: 98%  Weight: 164 lb (74.4 kg)  Height: 5' 9 (1.753 m)   Physical Exam Gen: Pleasant, well-nourished, in no distress,  normal affect  ENT: No lesions,  mouth clear,  oropharynx clear, no postnasal drip  Neck: No JVD, no stridor  Lungs: No use of accessory muscles, no crackles or wheezing on normal respiration, no wheeze on forced expiration  Cardiovascular: RRR, heart sounds normal, no murmur or gallops, no peripheral edema  Musculoskeletal: No deformities, no cyanosis or clubbing  Neuro: alert, awake, non focal  Skin: Warm, no lesions or rashes       Assessment & Plan:   Assessment & Plan Mild intermittent asthma without complication  Pulmonary nodules   Assessment & Plan Pulmonary nodules Multiple nodules with recent size increase in left lower lobe nodule from 4 mm to 7 mm. Low malignancy risk but requires monitoring due to size change. Family history of lung cancer noted, never smoker. - Ordered repeat chest CT in mid-February to monitor nodule size and  stability. - Will review CT results to determine further follow-up plan based on nodule stability.  Allergic rhinitis Chronic nasal congestion and morning phlegm likely due to allergic rhinitis. Symptoms bothersome, especially at night. Previous treatment effective but currently not on medication. - Recommended over-the-counter antihistamines or nasal sprays for symptom management. - Consider saline nasal spray for nasal moisture and cleaning.  Asthma Diagnosed 35-40 years ago. No recent attacks. Exertional dyspnea noted, possibly related to asthma or other pulmonary issues. Previously managed with Advair and inhalers, currently not on medication. - Ordered formal pulmonary function tests to assess lung capacity and airflow. - Will evaluate results to determine current asthma status and need for treatment.  Atrial  fibrillation Under evaluation by Dr.Mealor with cardiology and considering another ablation procedure. - Discussed with the patient that workup of his pulmonary nodules can happen in parallel with his plans to treat his atrial fibrillation.  At this time he just needs surveillance imaging and there are no barriers to proceeding with ablation if that is the best strategy for him in Dr. Marko opinion.   Return in about 11 weeks (around 01/19/2025) for With Dr. Shelah, To review CT scan of the chest.   I personally spent a total of 60 minutes in the care of the patient today including preparing to see the patient, getting/reviewing separately obtained history, performing a medically appropriate exam/evaluation, counseling and educating, placing orders, documenting clinical information in the EHR, independently interpreting results, and communicating results.   Lamar Shelah, MD, PhD 11/03/2024, 9:35 AM [facility] [phone] or if no answer before 7:00PM call [phone] For any issues after 7:00PM please call eLink [phone]

## 2024-11-09 ENCOUNTER — Telehealth: Payer: Self-pay | Admitting: Cardiovascular Disease

## 2024-11-09 NOTE — Telephone Encounter (Signed)
 Patient calling in to schd his ablation. Please advise

## 2024-11-10 ENCOUNTER — Other Ambulatory Visit: Payer: Self-pay

## 2024-11-10 DIAGNOSIS — I480 Paroxysmal atrial fibrillation: Secondary | ICD-10-CM

## 2024-11-10 NOTE — Telephone Encounter (Signed)
 Pt re-scheduled for Afib Ablation with Dr. Nancey on 1/29 at 930 am. He will come to [facility] to have updated labs.   I will send instruction letter via MyChart and mail to pt - address verified.   I explained to patient that he is scheduled to have another CT in February to recheck his lung nodule and if he has his Ablation he can not hold his Eliquis  for 3 months post procedure. He confirmed that he understood this and still would like to proceed with the Ablation.

## 2024-11-11 ENCOUNTER — Ambulatory Visit: Admitting: Emergency Medicine

## 2024-11-20 ENCOUNTER — Other Ambulatory Visit: Payer: Self-pay | Admitting: Cardiology

## 2024-11-22 NOTE — Telephone Encounter (Signed)
 Prescription refill request for Eliquis  received. Indication: A-Fib Last office visit: 07/12/24 Scr: 1.05 10/04/24 Care Everywhere Age: 78 Weight: 74.4 KG Pt has passed Parameters. Dose Good

## 2024-11-24 ENCOUNTER — Telehealth: Payer: Self-pay | Admitting: Cardiovascular Disease

## 2024-11-24 MED ORDER — AMIODARONE HCL 200 MG PO TABS: 200.0000 mg | ORAL_TABLET | Freq: Every day | ORAL | 0 refills | Status: DC

## 2024-11-24 NOTE — Telephone Encounter (Signed)
 Requested Prescriptions   Signed Prescriptions Disp Refills   amiodarone  (PACERONE ) 200 MG tablet 90 tablet 0    Sig: Take 1 tablet (200 mg total) by mouth daily.    Authorizing Provider: MEALOR, AUGUSTUS E    Ordering User: Calee Nugent  C

## 2024-11-24 NOTE — Telephone Encounter (Signed)
" °*  STAT* If patient is at the pharmacy, call can be transferred to refill team.   1. Which medications need to be refilled? (please list name of each medication and dose if known)   amiodarone  (PACERONE ) 200 MG tablet    2. Which pharmacy/location (including street and city if local pharmacy) is medication to be sent to? [id number] [place], [place] - [address] AT [facility] [address] & [facility] [address] Phone: [phone]  Fax: [phone]     3. Do they need a 30 day or 90 day supply? 90  "

## 2024-12-03 ENCOUNTER — Encounter (HOSPITAL_COMMUNITY): Payer: Self-pay

## 2024-12-03 LAB — CBC

## 2024-12-04 ENCOUNTER — Ambulatory Visit: Payer: Self-pay | Admitting: Cardiovascular Disease

## 2024-12-04 LAB — BASIC METABOLIC PANEL WITH GFR
BUN/Creatinine Ratio: 16 (ref 10–24)
BUN: 18 mg/dL (ref 8–27)
CO2: 21 mmol/L (ref 20–29)
Calcium: 8.9 mg/dL (ref 8.6–10.2)
Chloride: 105 mmol/L (ref 96–106)
Creatinine, Ser: 1.13 mg/dL (ref 0.76–1.27)
Glucose: 97 mg/dL (ref 70–99)
Potassium: 4.1 mmol/L (ref 3.5–5.2)
Sodium: 141 mmol/L (ref 134–144)
eGFR: 66 mL/min/1.73

## 2024-12-04 LAB — CBC
Hematocrit: 42.1 % (ref 37.5–51.0)
Hemoglobin: 13.9 g/dL (ref 13.0–17.7)
MCH: 31.5 pg (ref 26.6–33.0)
MCHC: 33 g/dL (ref 31.5–35.7)
MCV: 96 fL (ref 79–97)
Platelets: 237 x10E3/uL (ref 150–450)
RBC: 4.41 x10E6/uL (ref 4.14–5.80)
RDW: 12.7 % (ref 11.6–15.4)
WBC: 8 x10E3/uL (ref 3.4–10.8)

## 2024-12-07 ENCOUNTER — Telehealth (HOSPITAL_COMMUNITY): Payer: Self-pay

## 2024-12-07 ENCOUNTER — Other Ambulatory Visit: Payer: Self-pay | Admitting: Cardiology

## 2024-12-07 NOTE — Telephone Encounter (Signed)
 Spoke with patient to discuss upcoming procedure.   CT: completed.  Labs: completed.   Any recent signs of acute illness or been started on antibiotics? No Any new medications started?No  Any missed doses of blood thinner?  No  Advised patient to continue taking Eliquis  (Apixaban ) twice daily without missing any doses. Any medications to hold?  routine Medication instructions:  On the morning of your procedure DO NOT take any medication., including Eliquis  (Apixaban ) or the procedure may be rescheduled. Nothing to eat or drink after midnight prior to your procedure.  Confirmed patient is scheduled for Atrial Fibrillation Ablation on Thursday, January 29 with Dr. Nancey. Instructed patient to arrive at the Main Entrance A at [facility]: [address] [place], [place] [address] and check in at Admitting at 7:30 AM.   Plan to go home the same day, you will only stay overnight if medically necessary. You MUST have a responsible adult to drive you home and MUST be with you the first 24 hours after you arrive home or your procedure could be cancelled.  Informed patient a nurse will call a day before the procedure to confirm arrival time and ensure instructions are followed.  Patient verbalized understanding to all instructions provided and agreed to proceed with procedure.   Advised patient to contact RN Navigator at [phone], to inform of any new medications started after call or concerns prior to procedure.

## 2024-12-15 ENCOUNTER — Emergency Department (HOSPITAL_COMMUNITY)

## 2024-12-15 ENCOUNTER — Other Ambulatory Visit: Payer: Self-pay

## 2024-12-15 ENCOUNTER — Emergency Department (HOSPITAL_COMMUNITY)
Admission: EM | Admit: 2024-12-15 | Discharge: 2024-12-15 | Disposition: A | Discharge status: Home / Self Care | Attending: Emergency Medicine | Admitting: Emergency Medicine

## 2024-12-15 DIAGNOSIS — R0789 Other chest pain: Secondary | ICD-10-CM | POA: Diagnosis present

## 2024-12-15 DIAGNOSIS — Z7901 Long term (current) use of anticoagulants: Secondary | ICD-10-CM | POA: Insufficient documentation

## 2024-12-15 DIAGNOSIS — I482 Chronic atrial fibrillation, unspecified: Secondary | ICD-10-CM | POA: Diagnosis not present

## 2024-12-15 LAB — COMPREHENSIVE METABOLIC PANEL WITH GFR
ALT: 13 U/L (ref 0–44)
AST: 22 U/L (ref 15–41)
Albumin: 4.2 g/dL (ref 3.5–5.0)
Alkaline Phosphatase: 82 U/L (ref 38–126)
Anion gap: 15 (ref 5–15)
BUN: 19 mg/dL (ref 8–23)
CO2: 21 mmol/L — ABNORMAL LOW (ref 22–32)
Calcium: 9.2 mg/dL (ref 8.9–10.3)
Chloride: 104 mmol/L (ref 98–111)
Creatinine, Ser: 1 mg/dL (ref 0.61–1.24)
GFR, Estimated: >60 mL/min
Glucose, Bld: 97 mg/dL (ref 70–99)
Potassium: 4.2 mmol/L (ref 3.5–5.1)
Sodium: 141 mmol/L (ref 135–145)
Total Bilirubin: 0.4 mg/dL (ref 0.0–1.2)
Total Protein: 7.4 g/dL (ref 6.5–8.1)

## 2024-12-15 LAB — CBC
HCT: 44.3 % (ref 39.0–52.0)
Hemoglobin: 14.3 g/dL (ref 13.0–17.0)
MCH: 30.8 pg (ref 26.0–34.0)
MCHC: 32.3 g/dL (ref 30.0–36.0)
MCV: 95.3 fL (ref 80.0–100.0)
Platelets: 212 K/uL (ref 150–400)
RBC: 4.65 MIL/uL (ref 4.22–5.81)
RDW: 13.1 % (ref 11.5–15.5)
WBC: 7 K/uL (ref 4.0–10.5)
nRBC: 0 % (ref 0.0–0.2)

## 2024-12-15 LAB — TROPONIN T, HIGH SENSITIVITY: Troponin T High Sensitivity: 10 ng/L (ref 0–19)

## 2024-12-15 MED ORDER — METHOCARBAMOL 500 MG PO TABS: 500.0000 mg | ORAL_TABLET | Freq: Two times a day (BID) | ORAL | 0 refills | Status: AC | PRN

## 2024-12-15 NOTE — ED Triage Notes (Signed)
 Patient arrives via [facility] for chest pain. Patient has been having 3 -4 day left side dull chest pain. Seen at ortho believing it was shoulder pain. Ortho advised patient to be seen at cardiology. Cardiology called for EMS. Ablation scheduled in one week for afib. Patient did not have aspirin , had eliquis  today, no missed doses.  EMS vitals 156/80 HR 62 98 on room air

## 2024-12-15 NOTE — ED Provider Notes (Signed)
 " [place] [facility] Provider Note   CSN: [id number] Arrival date & time: 12/15/24  1622     Patient presents with: Chest Pain   David Wright is a 79 y.o. male history of A-fib on Eliquis , here presenting with chest pain.  Patient has left-sided chest pain for about 3 to 4 days.  Patient states that it is worse with movement.  He thought it was his shoulder and went to orthopedic doctor for evaluation and was sent to cardiology for evaluation.  Patient has a cardioversion scheduled next week.  Patient was sent here for rule out ACS.  No history of CAD or stents.  Patient denies any trauma or injury.  Patient does have a pulmonologist and does have a known lung nodule   The history is provided by the patient.       Prior to Admission medications  Medication Sig Start Date End Date Taking? Authorizing Provider  methocarbamol  (ROBAXIN ) 500 MG tablet Take 1 tablet (500 mg total) by mouth 2 (two) times daily as needed for muscle spasms. 12/15/24  Yes Patt Alm Macho, MD  amiodarone  (PACERONE ) 200 MG tablet Take 1 tablet (200 mg total) by mouth daily. 11/24/24   Mealor, Augustus E, MD  apixaban  (ELIQUIS ) 5 MG TABS tablet TAKE 1 TABLET TWICE A DAY 11/22/24   Jeffrie Oneil BROCKS, MD  fluticasone  (FLONASE ) 50 MCG/ACT nasal spray Place 2 sprays into both nostrils daily. Patient taking differently: Place 2 sprays into both nostrils daily as needed for allergies or rhinitis. 11/03/24   Shelah Lamar RAMAN, MD  rosuvastatin  (CRESTOR ) 20 MG tablet TAKE 1 TABLET(20 MG) BY MOUTH DAILY 12/07/24   Jeffrie Oneil BROCKS, MD  tamsulosin (FLOMAX) 0.4 MG CAPS capsule Take 0.4 mg by mouth daily.    [provider]    Allergies: Patient has no known allergies.    Review of Systems  Cardiovascular:  Positive for chest pain.  All other systems reviewed and are negative.   Updated Vital Signs BP (!) 171/87   Pulse 66   Temp 98 F (36.7 C) (Oral)   Resp 18   Ht 5' 9  (1.753 m)   Wt 74.4 kg   SpO2 98%   BMI 24.22 kg/m   Physical Exam Vitals and nursing note reviewed.  Constitutional:      Appearance: He is well-developed.  HENT:     Head: Normocephalic.  Eyes:     Extraocular Movements: Extraocular movements intact.     Pupils: Pupils are equal, round, and reactive to light.  Cardiovascular:     Rate and Rhythm: Normal rate and regular rhythm.     Heart sounds: Normal heart sounds.  Pulmonary:     Effort: Pulmonary effort is normal.     Breath sounds: Normal breath sounds.  Chest:     Comments: Reproducible left chest wall tenderness Abdominal:     General: Bowel sounds are normal.     Palpations: Abdomen is soft.  Musculoskeletal:        General: Normal range of motion.     Cervical back: Normal range of motion and neck supple.  Skin:    General: Skin is warm.  Neurological:     General: No focal deficit present.     Mental Status: He is alert and oriented to person, place, and time.  Psychiatric:        Mood and Affect: Mood normal.        Behavior: Behavior  normal.     (all labs ordered are listed, but only abnormal results are displayed) Labs Reviewed  COMPREHENSIVE METABOLIC PANEL WITH GFR - Abnormal; Notable for the following components:      Result Value   CO2 21 (*)    All other components within normal limits  CBC  TROPONIN T, HIGH SENSITIVITY  TROPONIN T, HIGH SENSITIVITY    EKG: EKG Interpretation Date/Time:  Wednesday December 15 2024 16:29:08 EST Ventricular Rate:  65 PR Interval:  154 QRS Duration:  76 QT Interval:  414 QTC Calculation: 430 R Axis:   40  Text Interpretation: Normal sinus rhythm Low voltage QRS Cannot rule out Anterior infarct , age undetermined Abnormal ECG When compared with ECG of 20-Aug-2024 11:44, PREVIOUS ECG IS PRESENT Confirmed by Patt Alm DEL ([id number]) on 12/15/2024 4:38:39 PM  Radiology: DG Chest 2 View Result Date: 12/15/2024 EXAM: 2 VIEW(S) XRAY OF THE CHEST 12/15/2024 05:52:00  PM COMPARISON: Chest x-ray 10/19/2022. CLINICAL HISTORY: cp cp cp cp cp FINDINGS: LUNGS AND PLEURA: No focal pulmonary opacity. No pleural effusion. No pneumothorax. HEART AND MEDIASTINUM: Aortic atherosclerosis. No acute abnormality of the cardiac and mediastinal silhouettes. BONES AND SOFT TISSUES: Multilevel thoracic osteophytosis. No acute osseous abnormality. IMPRESSION: 1. No acute cardiopulmonary pathology. Electronically signed by: Greig Pique MD 12/15/2024 06:07 PM EST RP Workstation: [id number]     Procedures   Medications Ordered in the ED - No data to display                                  Medical Decision Making David Wright is a 79 y.o. male here presenting with chest pain.  Pain is reproducible.  Likely musculoskeletal in nature.  Low suspicion for ACS.  Patient is also anticoagulated and I have low suspicion for PE.  Plan to get CBC and CMP and troponin x 1 (symptoms for several days) and chest x-ray  7:24 PM Labs and troponin chest x-ray unremarkable.  Will give Robaxin  as needed for chest wall pain.  Patient can get cardioversion as scheduled next week   Amount and/or Complexity of Data Reviewed Labs: ordered. Radiology: ordered.  Risk Prescription drug management.     Final diagnoses:  Chest wall pain    ED Discharge Orders          Ordered    methocarbamol  (ROBAXIN ) 500 MG tablet  2 times daily PRN        12/15/24 1917               Patt Alm Macho, MD 12/15/24 1924  "

## 2024-12-15 NOTE — ED Provider Triage Note (Signed)
 Emergency Medicine Provider Triage Evaluation Note  Laquincy Eastridge , a 79 y.o. male  was evaluated in triage.  Pt complains of noise chest pain.  Patient has left-sided chest pain for several days.  Patient went to Ortho and then was sent to cardiology and was sent here for further evaluation.  Has history of A-fib on Eliquis .  No history of CAD or stents  Review of Systems  Positive: Chest pain  Negative: Shortness of breath  Physical Exam  BP (!) 171/87   Pulse 66   Temp 98 F (36.7 C) (Oral)   Resp 18   Ht 5' 9 (1.753 m)   Wt 74.4 kg   SpO2 98%   BMI 24.22 kg/m  Gen:   Awake, no distress   Resp:  Normal effort  MSK:   Moves extremities without difficulty  Other:  Reproducible L chest wall tenderness   Medical Decision Making  Medically screening exam initiated at 4:43 PM.  Appropriate orders placed.  Michele Kerlin was informed that the remainder of the evaluation will be completed by another provider, this initial triage assessment does not replace that evaluation, and the importance of remaining in the ED until their evaluation is complete.  Kemar Pandit is a 79 y.o. male here presenting with chest pain.  Consider ACS versus musculoskeletal pain.  Will get CBC CMP and troponin and chest x-ray.  EKG showed no obvious STEMI.  Stable to wait in the waiting room    Patt Alm Macho, MD 12/15/24 1650

## 2024-12-15 NOTE — Discharge Instructions (Signed)
 As we discussed, your heart function is normal today  Please take Tylenol  as needed for pain and Robaxin  for muscle spasm  You should follow-up with your cardiologist and also get cardioversion next week as scheduled  Return to ER if you have worse chest pain or shortness of breath

## 2024-12-22 NOTE — Pre-Procedure Instructions (Signed)
 Instructed patient on the following items: Arrival time 0730 Nothing to eat or drink after midnight No meds AM of procedure Responsible person to drive you home and stay with you for 24 hrs  Have you missed any doses of anti-coagulant Eliquis - takes twice a day, hasn't missed any doses.  Don't take dose morning of procedure.

## 2024-12-23 ENCOUNTER — Encounter (HOSPITAL_COMMUNITY)
Admission: RE | Disposition: A | Discharge status: Home / Self Care | Payer: Self-pay | Source: Home / Self Care | Attending: Cardiovascular Disease

## 2024-12-23 ENCOUNTER — Ambulatory Visit (HOSPITAL_COMMUNITY): Admitting: Anesthesiology

## 2024-12-23 ENCOUNTER — Other Ambulatory Visit: Payer: Self-pay

## 2024-12-23 ENCOUNTER — Ambulatory Visit (HOSPITAL_COMMUNITY)
Admission: RE | Admit: 2024-12-23 | Discharge: 2024-12-23 | Disposition: A | Discharge status: Home / Self Care | Attending: Cardiovascular Disease | Admitting: Cardiovascular Disease

## 2024-12-23 DIAGNOSIS — D6869 Other thrombophilia: Secondary | ICD-10-CM | POA: Diagnosis not present

## 2024-12-23 DIAGNOSIS — I10 Essential (primary) hypertension: Secondary | ICD-10-CM | POA: Diagnosis not present

## 2024-12-23 DIAGNOSIS — I480 Paroxysmal atrial fibrillation: Secondary | ICD-10-CM

## 2024-12-23 DIAGNOSIS — I2510 Atherosclerotic heart disease of native coronary artery without angina pectoris: Secondary | ICD-10-CM | POA: Diagnosis not present

## 2024-12-23 DIAGNOSIS — Z7901 Long term (current) use of anticoagulants: Secondary | ICD-10-CM | POA: Diagnosis not present

## 2024-12-23 DIAGNOSIS — Z79899 Other long term (current) drug therapy: Secondary | ICD-10-CM | POA: Insufficient documentation

## 2024-12-23 DIAGNOSIS — Z87891 Personal history of nicotine dependence: Secondary | ICD-10-CM | POA: Diagnosis not present

## 2024-12-23 DIAGNOSIS — J45909 Unspecified asthma, uncomplicated: Secondary | ICD-10-CM | POA: Diagnosis not present

## 2024-12-23 DIAGNOSIS — Z136 Encounter for screening for cardiovascular disorders: Secondary | ICD-10-CM | POA: Diagnosis not present

## 2024-12-23 MED ORDER — HEPARIN SODIUM (PORCINE) 1000 UNIT/ML IJ SOLN
INTRAMUSCULAR | Status: DC | PRN
  Administered 2024-12-23: 4000 [IU] via INTRAVENOUS
  Administered 2024-12-23: 12000 [IU] via INTRAVENOUS

## 2024-12-23 MED ORDER — PROTAMINE SULFATE 10 MG/ML IV SOLN
INTRAVENOUS | Status: DC | PRN
  Administered 2024-12-23: 50 mg via INTRAVENOUS

## 2024-12-23 MED ORDER — LIDOCAINE 2% (20 MG/ML) 5 ML SYRINGE
INTRAMUSCULAR | Status: DC | PRN
  Administered 2024-12-23: 60 mg via INTRAVENOUS

## 2024-12-23 MED ORDER — HEPARIN (PORCINE) IN NACL 1000-0.9 UT/500ML-% IV SOLN
INTRAVENOUS | Status: DC | PRN
  Administered 2024-12-23 (×3): 500 mL

## 2024-12-23 MED ORDER — FENTANYL CITRATE (PF) 100 MCG/2ML IJ SOLN
INTRAMUSCULAR | Status: DC | PRN
  Administered 2024-12-23: 100 ug via INTRAVENOUS

## 2024-12-23 MED ORDER — FENTANYL CITRATE (PF) 100 MCG/2ML IJ SOLN
INTRAMUSCULAR | Status: AC
  Filled 2024-12-23: qty 2

## 2024-12-23 MED ORDER — ROCURONIUM BROMIDE 10 MG/ML (PF) SYRINGE
PREFILLED_SYRINGE | INTRAVENOUS | Status: DC | PRN
  Administered 2024-12-23: 60 mg via INTRAVENOUS

## 2024-12-23 MED ORDER — ATROPINE SULFATE 1 MG/10ML IJ SOSY
PREFILLED_SYRINGE | INTRAMUSCULAR | Status: DC | PRN
  Administered 2024-12-23: 1 mg via INTRAVENOUS

## 2024-12-23 MED ORDER — PROPOFOL 10 MG/ML IV BOLUS
INTRAVENOUS | Status: DC | PRN
  Administered 2024-12-23: 130 mg via INTRAVENOUS

## 2024-12-23 MED ORDER — SODIUM CHLORIDE 0.9 % IV SOLN: INTRAVENOUS | Status: DC

## 2024-12-23 MED ORDER — PHENYLEPHRINE HCL-NACL 20-0.9 MG/250ML-% IV SOLN
INTRAVENOUS | Status: DC | PRN
  Administered 2024-12-23: 25 ug/min via INTRAVENOUS

## 2024-12-23 MED ORDER — EPHEDRINE SULFATE-NACL 50-0.9 MG/10ML-% IV SOSY
PREFILLED_SYRINGE | INTRAVENOUS | Status: DC | PRN
  Administered 2024-12-23: 5 mg via INTRAVENOUS

## 2024-12-23 MED ORDER — SUGAMMADEX SODIUM 200 MG/2ML IV SOLN
INTRAVENOUS | Status: DC | PRN
  Administered 2024-12-23: 200 mg via INTRAVENOUS

## 2024-12-23 MED ORDER — PHENYLEPHRINE 80 MCG/ML (10ML) SYRINGE FOR IV PUSH (FOR BLOOD PRESSURE SUPPORT)
PREFILLED_SYRINGE | INTRAVENOUS | Status: DC | PRN
  Administered 2024-12-23: 160 ug via INTRAVENOUS

## 2024-12-23 MED ORDER — ONDANSETRON HCL 4 MG/2ML IJ SOLN
INTRAMUSCULAR | Status: DC | PRN
  Administered 2024-12-23: 4 mg via INTRAVENOUS

## 2024-12-23 NOTE — H&P (Signed)
 " Electrophysiology Office Note:    Date:  12/23/2024   ID:  David Wright, DOB 25-Nov-1946, MRN [id number]  PCP:  Rexanne Ingle, MD   [facility] Providers Cardiologist:  Oneil Parchment, MD Electrophysiologist:  Eulas FORBES Furbish, MD     Referring MD: No ref. provider found   Chief complaint: PAF  History of Present Illness:    David Wright is a 79 y.o. male with a hx of HLD, AF referred for arrhythmia management.  He has symptomatic paroxysmal AF. His burden has historically been very low, a few episodes a year. He has been managed with flecainide , metoprolol , and apixaban .   He has been having a few more episodes than usual recently. He will often take an extra tablet of flecainide . The episodes may last a few minutes or hours. He knows immediately when he has AF and becomes more fatigued and short of breath.  Since our last visit, he has been doing quite well. He had a coronary angiogram that did not show any clinically significant obstruction.  Because he had recurrence on flecainide , it was discontinued.  Amiodarone  was started, and he has been doing very well on it without any symptoms of AF recurrence.  He underwent uncomplicated atrial fibrillation ablation on February 12, 2023.  Prior to ablation, his CT showed an elevated CAC of 914.  Subsequent left heart cath showed moderate coronary disease; PCI was not indicated.  Since the ablation, he reports that he has not had any symptoms of A-fib recurrence.  He continues to take amiodarone  100 mg daily.  Heart rates at home have been in the 50s.  On September 12 he called into the office complaining of awakening with heart rates of 140 bpm.  He resumed amiodarone  and was seen the same day in clinic.  Atrial fibrillation had resolved at that point and the EKG showed sinus rhythm.  I reviewed tracings from that morning that show atrial fibrillation with high ventricular rates.  He remains off of amiodarone  and has not had another  recurrence of atrial fibrillation. I reviewed the patient's CT and labs. There was no LAA thrombus. he  has not missed any doses of anticoagulation, and he took his dose last night. There have been no changes in the patient's diagnoses, medications, or condition since our recent clinic visit.   EKGs/Labs/Other Studies Reviewed Today:     CT cardiac calcium  score: 09/23/2022 914, 72 %ile. (Started rosuvastatin )  EKG:  Last EKG results: today - see Epic   Recent Labs: 12/15/2024: ALT 13; BUN 19; Creatinine, Ser 1.00; Hemoglobin 14.3; Platelets 212; Potassium 4.2; Sodium 141     Physical Exam:    VS:  BP (!) 148/73   Pulse 69   Temp (!) 97.5 F (36.4 C) (Oral)   Resp 14   Ht 5' 9 (1.753 m)   Wt 72.6 kg   SpO2 97%   BMI 23.63 kg/m     Wt Readings from Last 3 Encounters:  12/23/24 72.6 kg  12/15/24 74.4 kg  11/03/24 74.4 kg     GEN: Well nourished, well developed in no acute distress CARDIAC: RRR, no murmurs, rubs, gallops RESPIRATORY:  Normal work of breathing MUSCULOSKELETAL: no edema    ASSESSMENT & PLAN:    Paroxysmal atrial fibrillation:  symptomatic.  Failed flecainide  - had recurrence.  He has had multiple episodes of A-fib recurrence in the past year We discussed management options and using a shared decision making approach decided to proceed with  repeat ablation. Continue amiodarone  until his ablation Will plan to use carto for mapping of prior ablation  We discussed the indication, rationale, logistics, anticipated benefits, and potential risks of the ablation procedure including but not limited to -- bleed at the groin access site, chest pain, damage to nearby organs such as the diaphragm, lungs, or esophagus, need for a drainage tube, or prolonged hospitalization. I explained that the risk for stroke, heart attack, need for open chest surgery, or even death is very low but not zero. he  expressed understanding and wishes to proceed.   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  Concerns regarding medicines are outlined above.  Orders Placed This Encounter  Procedures   Informed Consent Details: Physician/Practitioner Attestation; Transcribe to consent form and obtain patient signature   Initiate Pre-op Protocol   Void on call to EP Lab   Confirm CBC and BMP (or CMP) results within 7 days for inpatient and 30 days for outpatient:   Clip right and left femoral area PM before surgery   Clip right internal jugular area PM before surgery   Pre-admission testing diagnosis   EP STUDY   Insert peripheral IV   Meds ordered this encounter  Medications   0.9 %  sodium chloride  infusion     Signed, Eulas FORBES Furbish, MD  12/23/2024 11:29 AM    [facility]  "

## 2024-12-23 NOTE — Discharge Instructions (Signed)

## 2024-12-23 NOTE — Transfer of Care (Signed)
 Immediate Anesthesia Transfer of Care Note  Patient: David Wright  Procedure(s) Performed: ATRIAL FIBRILLATION ABLATION  Patient Location: Cath Lab  Anesthesia Type:General  Level of Consciousness: drowsy and patient cooperative  Airway & Oxygen Therapy: Patient Spontanous Breathing  Post-op Assessment: Report given to RN and Post -op Vital signs reviewed and stable  Post vital signs: Reviewed and stable  Last Vitals:  Vitals Value Taken Time  BP 120/70 12/23/24 13:12  Temp    Pulse 57 12/23/24 13:12  Resp 0 12/23/24 13:12  SpO2 93 % 12/23/24 13:12  Vitals shown include unfiled device data.  Last Pain:  Vitals:   12/23/24 0740  TempSrc: Oral         Complications: There were no known notable events for this encounter.

## 2024-12-23 NOTE — Anesthesia Preprocedure Evaluation (Signed)
 "                                  Anesthesia Evaluation  Patient identified by MRN, date of birth, ID band Patient awake    Reviewed: Allergy & Precautions, NPO status , Patient's Chart, lab work & pertinent test results  History of Anesthesia Complications (+) POST - OP SPINAL HEADACHE and history of anesthetic complications  Airway Mallampati: III  TM Distance: >3 FB Neck ROM: Full    Dental  (+) Teeth Intact, Dental Advisory Given   Pulmonary neg shortness of breath, asthma , neg sleep apnea, neg COPD, neg recent URI, Patient abstained from smoking.Not current smoker   breath sounds clear to auscultation       Cardiovascular Exercise Tolerance: Good METS(-) hypertension+ CAD  (-) Past MI + dysrhythmias Atrial Fibrillation  Rhythm:Regular    Ost LM lesion is 45% stenosed.   Prox LAD lesion is 30% stenosed.   Ost Cx to Mid Cx lesion is 20% stenosed.   Mid RCA lesion is 50% stenosed.   RPAV lesion is 50% stenosed.   The left ventricular systolic function is normal.   LV end diastolic pressure is normal.   The left ventricular ejection fraction is 55-65% by visual estimate.   1. Nonobstructive CAD 2. Normal LV function 3. Normal LVEDP    1. Left ventricular ejection fraction, by estimation, is 55 to 60%. The  left ventricle has normal function. The left ventricle has no regional  wall motion abnormalities. Left ventricular diastolic parameters are  consistent with Grade I diastolic  dysfunction (impaired relaxation).   2. Right ventricular systolic function is normal. The right ventricular  size is mildly enlarged. Tricuspid regurgitation signal is inadequate for  assessing PA pressure.   3. Borderline bileaflet mitral valve prolapse. The mitral valve is  grossly normal. Trivial mitral valve regurgitation. No evidence of mitral  stenosis.   4. The aortic valve is grossly normal. There is mild calcification of the  aortic valve. Aortic valve  regurgitation is trivial. No aortic stenosis is  present.   5. Pulmonic valve regurgitation is moderate.   6. The inferior vena cava is normal in size with greater than 50%  respiratory variability, suggesting right atrial pressure of 3 mmHg.     Neuro/Psych  Headaches  negative psych ROS   GI/Hepatic negative GI ROS, Neg liver ROS,neg GERD  ,,  Endo/Other  negative endocrine ROSneg diabetes    Renal/GU negative Renal ROS     Musculoskeletal  (+) Arthritis ,    Abdominal   Peds  Hematology  (+) Blood dyscrasia Eliquis   Lab Results      Component                Value               Date                      WBC                      6.6                 01/24/2023                HGB  14.2                01/24/2023                HCT                      41.4                01/24/2023                MCV                      93                  01/24/2023                PLT                      196                 01/24/2023              Anesthesia Other Findings Past Medical History: No date: Anginal pain     Comment:  has been worked-up but all neg. No date: Arthritis No date: Asthma     Comment:  well controlled No date: Atrial tachycardia, paroxysmal 40 yrs.ago: Complication of anesthesia     Comment:  spinal headache No date: Dysrhythmia     Comment:  A-Fibrillation No date: Headache No date: Hypercholesteremia     Comment:  pt reports that his cholesterol is normal No date: PAC (premature atrial contraction) No date: Paroxysmal atrial fibrillation (HCC) No date: Snores No date: Spinal headache     Comment:  40 years ago  Reproductive/Obstetrics                              Anesthesia Physical Anesthesia Plan  ASA: 3  Anesthesia Plan: General   Post-op Pain Management: Minimal or no pain anticipated   Induction: Intravenous  PONV Risk Score and Plan: 2 and Ondansetron , Dexamethasone , Propofol  infusion,  TIVA and Treatment may vary due to age or medical condition  Airway Management Planned: Oral ETT  Additional Equipment: None  Intra-op Plan:   Post-operative Plan: Extubation in OR  Informed Consent: I have reviewed the patients History and Physical, chart, labs and discussed the procedure including the risks, benefits and alternatives for the proposed anesthesia with the patient or authorized representative who has indicated his/her understanding and acceptance.     Dental advisory given  Plan Discussed with: CRNA  Anesthesia Plan Comments: (Discussed risks of anesthesia with patient, including PONV, sore throat, lip/dental/eye damage. Rare risks discussed as well, such as cardiorespiratory and neurological sequelae, and allergic reactions. Discussed the role of CRNA in patient's perioperative care. Patient understands.)         Anesthesia Quick Evaluation  "

## 2024-12-23 NOTE — Anesthesia Postprocedure Evaluation (Signed)
"   Anesthesia Post Note  Patient: David Wright  Procedure(s) Performed: ATRIAL FIBRILLATION ABLATION     Patient location during evaluation: PACU Anesthesia Type: General Level of consciousness: awake and alert Pain management: pain level controlled Vital Signs Assessment: post-procedure vital signs reviewed and stable Respiratory status: spontaneous breathing, nonlabored ventilation, respiratory function stable and patient connected to nasal cannula oxygen Cardiovascular status: blood pressure returned to baseline and stable Postop Assessment: no apparent nausea or vomiting Anesthetic complications: no   There were no known notable events for this encounter.  Last Vitals:  Vitals:   12/23/24 1355 12/23/24 1400  BP: 101/63 (!) 100/58  Pulse: (!) 59 (!) 52  Resp: 12 13  Temp:    SpO2: 92% 94%    Last Pain:  Vitals:   12/23/24 1455  TempSrc:   PainSc: 0-No pain   Pain Goal:                   [name]      "

## 2024-12-23 NOTE — Anesthesia Procedure Notes (Signed)
 Procedure Name: Intubation Date/Time: 12/23/2024 12:05 PM  Performed by: Marva Lonni PARAS, CRNAPre-anesthesia Checklist: Patient identified, Emergency Drugs available, Suction available and Patient being monitored Patient Re-evaluated:Patient Re-evaluated prior to induction Oxygen Delivery Method: Circle System Utilized Preoxygenation: Pre-oxygenation with 100% oxygen Induction Type: IV induction Ventilation: Mask ventilation without difficulty Laryngoscope Size: Mac and 4 Grade View: Grade I Tube type: Oral Tube size: 7.5 mm Number of attempts: 1 Airway Equipment and Method: Stylet Placement Confirmation: ETT inserted through vocal cords under direct vision, positive ETCO2 and breath sounds checked- equal and bilateral Secured at: 23 cm Tube secured with: Tape Dental Injury: Teeth and Oropharynx as per pre-operative assessment

## 2024-12-24 ENCOUNTER — Encounter (HOSPITAL_COMMUNITY): Payer: Self-pay | Admitting: Cardiovascular Disease

## 2024-12-24 LAB — POCT ACTIVATED CLOTTING TIME: Activated Clotting Time: 271 s

## 2025-01-05 ENCOUNTER — Other Ambulatory Visit

## 2025-01-06 ENCOUNTER — Other Ambulatory Visit

## 2025-01-19 ENCOUNTER — Encounter

## 2025-01-19 ENCOUNTER — Ambulatory Visit: Admitting: Emergency Medicine

## 2025-01-20 ENCOUNTER — Ambulatory Visit (HOSPITAL_COMMUNITY): Admitting: Physician Assistant

## 2025-03-23 ENCOUNTER — Ambulatory Visit: Admitting: Cardiology
